# Patient Record
Sex: Male | Born: 1935 | Race: White | Hispanic: Yes | Marital: Married | Smoking: Former smoker
Health system: Southern US, Community
[De-identification: ages and names within clinical notes are randomized; demographics above are authoritative.]

## PROBLEM LIST (undated history)

## (undated) DIAGNOSIS — G8929 Other chronic pain: Secondary | ICD-10-CM

## (undated) DIAGNOSIS — M25559 Pain in unspecified hip: Secondary | ICD-10-CM

## (undated) DIAGNOSIS — I10 Essential (primary) hypertension: Secondary | ICD-10-CM

## (undated) DIAGNOSIS — E669 Obesity, unspecified: Secondary | ICD-10-CM

## (undated) DIAGNOSIS — K922 Gastrointestinal hemorrhage, unspecified: Secondary | ICD-10-CM

## (undated) DIAGNOSIS — E785 Hyperlipidemia, unspecified: Secondary | ICD-10-CM

## (undated) DIAGNOSIS — I639 Cerebral infarction, unspecified: Secondary | ICD-10-CM

## (undated) HISTORY — PX: TONSILLECTOMY: SUR1361

## (undated) NOTE — *Deleted (*Deleted)
[facility]ENCY DEPT Provider Student Note For educational purposes for Medical, PA and NP students only and not part of the legal medical record.   CSN: [id number] Arrival date & time: 12/15/19  1911      History   Chief Complaint Chief Complaint  Patient presents with  . Constipation    HPI Seymore Brodowski is a 56 y.o. male.  The history is provided by the patient.     Pt is an 46 year old male with a history of HTN who presents today with a 2 month history of constipation. He states that he has only been having about 1 BM per week and is "unsure of any pain". He states that he "inducing vomiting" because he can feel when his food gets stuck after eating and feels his stomach "swell". He does endorse both hematemesis and melena. He has tried magnesium for his constipation and says that he noticing it get to the end and will have to manually disimpact. He has a 100 pack per year history of smoking. He also states that he had a colonoscopy about 10 years ago and had some polyps. He denies and previous abdominal surgeries. He also states that from August 2021 to September 2021 he lost about 20 lbs.   Past Medical History:  Diagnosis Date  . Gastric ulcer    secondary to nsaid 10 years ago  . GI bleed 04/04/2011  . Hip pain, chronic    right  . HLD (hyperlipidemia)   . Hypertension   . Obesity (BMI 35.0-39.9 without comorbidity) 03/2011  . Stroke [facility])    2005    Patient Active Problem List   Diagnosis Date Noted  . Diverticulosis of colon (without mention of hemorrhage) 04/06/2011  . Benign neoplasm of colon 04/06/2011  . Diverticulosis of colon with hemorrhage 04/06/2011  . Diaphragmatic hernia without mention of obstruction or gangrene 04/06/2011  . Hyperglycemia 04/05/2011  . Syncope and collapse 04/05/2011  . Ankle fracture, lateral malleolus, closed 04/05/2011  . Acute posthemorrhagic anemia 04/05/2011  . Blood in stool 04/05/2011  . Acute GI bleeding 04/05/2011     Past Surgical History:  Procedure Laterality Date  . TONSILLECTOMY         Home Medications    Prior to Admission medications   Medication Sig Start Date End Date Taking? Authorizing Provider  amLODipine (NORVASC) 10 MG tablet Take 1 tablet (10 mg total) by mouth daily. 10/21/19   Shade Flood, MD  lisinopril-hydrochlorothiazide (ZESTORETIC) 10-12.5 MG tablet Take 1 tablet by mouth daily. 10/21/19   Shade Flood, MD    Family History Family History  Problem Relation Age of Onset  . Stroke Mother   . Kidney disease Brother   . Diabetes Brother     Social History Social History   Tobacco Use  . Smoking status: Former Games developer  . Smokeless tobacco: Never Used  Substance Use Topics  . Alcohol use: Yes    Comment: occasional  . Drug use: No     Allergies   Pravastatin   Review of Systems Review of Systems  Constitutional: Positive for unexpected weight change (20lb weight loss from August 2021-Sept. 2021). Negative for chills, diaphoresis, fatigue and fever.  HENT: Negative.   Eyes: Negative.   Respiratory: Negative.   Cardiovascular: Negative.   Gastrointestinal: Positive for abdominal pain, blood in stool, constipation and vomiting (self induced). Negative for abdominal distention, diarrhea and nausea.  Endocrine: Negative.   Genitourinary: Negative.   Musculoskeletal: Negative.  Skin: Negative.   Allergic/Immunologic: Negative.   Neurological: Negative.   Hematological: Negative.   Psychiatric/Behavioral: Negative.      Physical Exam Updated Vital Signs BP 134/86   Pulse (!) 110   Temp 98.9 F (37.2 C) (Oral)   Resp 18   Ht 5\' 6"  (1.676 m)   Wt 90 kg   SpO2 100%   BMI 32.02 kg/m   Physical Exam Constitutional:      Appearance: Normal appearance.  HENT:     Head: Normocephalic and atraumatic.  Abdominal:     General: Bowel sounds are normal.     Palpations: There is mass (central, right of midline).     Tenderness: There is  abdominal tenderness.    Neurological:     Mental Status: He is alert.      ED Treatments / Results  Labs (all labs ordered are listed, but only abnormal results are displayed) Labs Reviewed  LIPASE, BLOOD - Abnormal; Notable for the following components:      Result Value   Lipase 55 (*)    All other components within normal limits  COMPREHENSIVE METABOLIC PANEL - Abnormal; Notable for the following components:   Potassium 3.4 (*)    Glucose, Bld 156 (*)    BUN 50 (*)    Creatinine, Ser 2.02 (*)    Calcium 8.6 (*)    Total Protein 5.1 (*)    Albumin 2.8 (*)    Alkaline Phosphatase 37 (*)    GFR, Estimated 32 (*)    All other components within normal limits  CBC - Abnormal; Notable for the following components:   WBC 19.4 (*)    RBC 1.83 (*)    Hemoglobin 5.2 (*)    HCT 17.1 (*)    RDW 17.7 (*)    nRBC 1.0 (*)    All other components within normal limits  RESPIRATORY PANEL BY RT PCR (FLU A&B, COVID)  PROTIME-INR  URINALYSIS, ROUTINE W REFLEX MICROSCOPIC  TYPE AND SCREEN  PREPARE RBC (CROSSMATCH)    EKG  Radiology CT ABDOMEN PELVIS WO CONTRAST  Result Date: 12/15/2019 CLINICAL DATA:  Gastrointestinal hemorrhage, diffuse abdominal pain, emesis EXAM: CT ABDOMEN AND PELVIS WITHOUT CONTRAST TECHNIQUE: Multidetector CT imaging of the abdomen and pelvis was performed following the standard protocol without IV contrast. COMPARISON:  None. FINDINGS: Lower chest: The visualized lung bases are clear save for a few insignificant pneumatoceles. Visualized heart and pericardium are unremarkable. Hepatobiliary: Multiple simple cysts noted within the a hepatic dome. Liver otherwise unremarkable the gallbladder is contracted without pericholecystic inflammatory change. Punctate calcification within the gallbladder fossa may relate to remote or chronic inflammation. The extrahepatic bile duct is within normal limits for age. No intrahepatic biliary ductal dilation. Pancreas:  Unremarkable Spleen: Fall unremarkable Adrenals/Urinary Tract: Bilateral adrenal adenoma are identified measuring 20 mm on the left and 22 mm on the right. The kidneys are normal in size and position. No intrarenal or ureteral calculi. Exophytic simple cortical cysts arise from the left kidney. There is moderate bilateral hydronephrosis and marked hydroureter. There is massive distention of the bladder with an estimated bladder volume of approximately 1800 cc. Stomach/Bowel: Severe sigmoid diverticulosis. Scattered diverticular seen throughout the remainder of the colon. The stomach, small bowel, and large bowel are otherwise unremarkable. Appendix normal. No free intraperitoneal gas or fluid. Vascular/Lymphatic: Moderate aortoiliac atherosclerotic calcification. No aneurysm. No pathologic adenopathy within the abdomen and pelvis Reproductive: Moderate prostatic enlargement. Seminal vesicles are unremarkable. Other: Small fat containing bilateral  inguinal hernias are present. Rectum unremarkable. Musculoskeletal: No acute bone abnormality. Bilateral L5 pars defects are present with grade 1 anterolisthesis of L5 upon S1. Degenerative changes are seen throughout the lumbar spine. IMPRESSION: Marked distension of the bladder and moderate bilateral hydronephrosis in keeping with changes of neurogenic bladder or bladder outlet obstruction, possibly related to prostatic hypertrophy. The estimated bladder volume is approximately 1800 cc. Contracted gallbladder demonstrating punctate foci of calcification within the gallbladder fossa possibly reflecting changes of chronic cholecystitis. This could be further assessed with dedicated sonography or hepatic biliary scintigraphy once the patient's acute issues have resolved. Bilateral adrenal adenoma. Aortic Atherosclerosis (ICD10-I70.0). Electronically Signed   By: Helyn Numbers MD   On: 12/15/2019 22:27   DG Chest 2 View  Result Date: 12/15/2019 CLINICAL DATA:  GI  bleeding, concern for malignancy, evaluate pulmonary nodules EXAM: CHEST - 2 VIEW COMPARISON:  Insert agree CT abdomen pelvis 12/15/2019, radiograph 04/04/2011 FINDINGS: Low lung volumes. Vascular crowding with ectatic changes. No discernible pulmonary nodules or masses. Mild vascular congestion without frank edema. No consolidative opacity. Prominent cardiac silhouette though possibly accentuated by technique. The aorta is calcified. The remaining cardiomediastinal contours are unremarkable. No acute osseous or soft tissue abnormality. Degenerative changes are present in the imaged spine and shoulders. Telemetry leads overlie the chest. IMPRESSION: Low lung volumes, atelectasis and vascular congestion without frank edema. No discerning pulmonary nodules or masses though evaluation limited on portable radiographic technique. These results and limitations were discussed by telephone at the time of interpretation on 12/15/2019 at 10:32 pm to provider [facility] , who verbally acknowledged these results. Electronically Signed   By: Kreg Shropshire M.D.   On: 12/15/2019 22:32    Procedures Procedures (including critical care time)  Medications Ordered in ED Medications  0.9 %  sodium chloride infusion (has no administration in time range)  lidocaine (XYLOCAINE) 2 % jelly 1 application (has no administration in time range)  sodium chloride 0.9 % bolus 500 mL (500 mLs Intravenous New Bag/Given 12/15/19 2159)     Initial Impression / Assessment and Plan / ED Course  I have reviewed the triage vital signs and the nursing notes.  Pertinent labs & imaging results that were available during my care of the patient were reviewed by me and considered in my medical decision making (see chart for details).     ***  Final Clinical Impressions(s) / ED Diagnoses   Final diagnoses:  None    New Prescriptions New Prescriptions   No medications on file

---

## 2000-07-15 ENCOUNTER — Inpatient Hospital Stay (HOSPITAL_COMMUNITY): Admission: EM | Admit: 2000-07-15 | Discharge: 2000-07-18 | Payer: Self-pay | Admitting: Emergency Medicine

## 2000-07-15 ENCOUNTER — Encounter: Payer: Self-pay | Admitting: Emergency Medicine

## 2000-07-15 ENCOUNTER — Encounter: Payer: Self-pay | Admitting: Pediatrics

## 2011-03-30 DIAGNOSIS — E669 Obesity, unspecified: Secondary | ICD-10-CM

## 2011-03-30 HISTORY — DX: Obesity, unspecified: E66.9

## 2011-04-04 ENCOUNTER — Encounter (HOSPITAL_COMMUNITY): Payer: Self-pay | Admitting: Internal Medicine

## 2011-04-04 ENCOUNTER — Inpatient Hospital Stay (HOSPITAL_COMMUNITY)
Admission: EM | Admit: 2011-04-04 | Discharge: 2011-04-06 | DRG: 378 | Disposition: A | Discharge status: Home / Self Care | Payer: Medicare Other | Attending: Internal Medicine | Admitting: Internal Medicine

## 2011-04-04 ENCOUNTER — Other Ambulatory Visit: Payer: Self-pay

## 2011-04-04 ENCOUNTER — Emergency Department (HOSPITAL_COMMUNITY): Payer: Medicare Other

## 2011-04-04 DIAGNOSIS — I10 Essential (primary) hypertension: Secondary | ICD-10-CM | POA: Diagnosis present

## 2011-04-04 DIAGNOSIS — G8929 Other chronic pain: Secondary | ICD-10-CM | POA: Diagnosis present

## 2011-04-04 DIAGNOSIS — R739 Hyperglycemia, unspecified: Secondary | ICD-10-CM | POA: Diagnosis present

## 2011-04-04 DIAGNOSIS — E785 Hyperlipidemia, unspecified: Secondary | ICD-10-CM | POA: Diagnosis present

## 2011-04-04 DIAGNOSIS — K449 Diaphragmatic hernia without obstruction or gangrene: Secondary | ICD-10-CM | POA: Diagnosis present

## 2011-04-04 DIAGNOSIS — K5730 Diverticulosis of large intestine without perforation or abscess without bleeding: Secondary | ICD-10-CM | POA: Diagnosis present

## 2011-04-04 DIAGNOSIS — E669 Obesity, unspecified: Secondary | ICD-10-CM | POA: Diagnosis present

## 2011-04-04 DIAGNOSIS — D649 Anemia, unspecified: Secondary | ICD-10-CM | POA: Diagnosis present

## 2011-04-04 DIAGNOSIS — K648 Other hemorrhoids: Secondary | ICD-10-CM | POA: Diagnosis present

## 2011-04-04 DIAGNOSIS — K921 Melena: Secondary | ICD-10-CM | POA: Diagnosis present

## 2011-04-04 DIAGNOSIS — R7309 Other abnormal glucose: Secondary | ICD-10-CM | POA: Diagnosis present

## 2011-04-04 DIAGNOSIS — D62 Acute posthemorrhagic anemia: Secondary | ICD-10-CM | POA: Diagnosis present

## 2011-04-04 DIAGNOSIS — M25559 Pain in unspecified hip: Secondary | ICD-10-CM | POA: Diagnosis present

## 2011-04-04 DIAGNOSIS — S8263XA Displaced fracture of lateral malleolus of unspecified fibula, initial encounter for closed fracture: Secondary | ICD-10-CM | POA: Diagnosis present

## 2011-04-04 DIAGNOSIS — D126 Benign neoplasm of colon, unspecified: Secondary | ICD-10-CM | POA: Diagnosis present

## 2011-04-04 DIAGNOSIS — W19XXXA Unspecified fall, initial encounter: Secondary | ICD-10-CM | POA: Diagnosis present

## 2011-04-04 DIAGNOSIS — K5731 Diverticulosis of large intestine without perforation or abscess with bleeding: Principal | ICD-10-CM | POA: Diagnosis present

## 2011-04-04 DIAGNOSIS — K922 Gastrointestinal hemorrhage, unspecified: Secondary | ICD-10-CM | POA: Diagnosis present

## 2011-04-04 DIAGNOSIS — Z8673 Personal history of transient ischemic attack (TIA), and cerebral infarction without residual deficits: Secondary | ICD-10-CM

## 2011-04-04 DIAGNOSIS — R55 Syncope and collapse: Secondary | ICD-10-CM | POA: Diagnosis present

## 2011-04-04 DIAGNOSIS — Z79899 Other long term (current) drug therapy: Secondary | ICD-10-CM

## 2011-04-04 HISTORY — DX: Gastrointestinal hemorrhage, unspecified: K92.2

## 2011-04-04 HISTORY — DX: Pain in unspecified hip: M25.559

## 2011-04-04 HISTORY — DX: Cerebral infarction, unspecified: I63.9

## 2011-04-04 HISTORY — DX: Hyperlipidemia, unspecified: E78.5

## 2011-04-04 HISTORY — DX: Essential (primary) hypertension: I10

## 2011-04-04 HISTORY — DX: Other chronic pain: G89.29

## 2011-04-04 HISTORY — DX: Obesity, unspecified: E66.9

## 2011-04-04 LAB — CARDIAC PANEL(CRET KIN+CKTOT+MB+TROPI)
Relative Index: 2 (ref 0.0–2.5)
Total CK: 173 U/L (ref 7–232)
Troponin I: <0.3 ng/mL (ref <0.30)

## 2011-04-04 LAB — COMPREHENSIVE METABOLIC PANEL
ALT: 11 U/L (ref 0–53)
Albumin: 2.8 g/dL — ABNORMAL LOW (ref 3.5–5.2)
Alkaline Phosphatase: 54 U/L (ref 39–117)
Calcium: 7.8 mg/dL — ABNORMAL LOW (ref 8.4–10.5)
GFR calc Af Amer: >90 mL/min (ref >90)
Potassium: 3.4 mEq/L — ABNORMAL LOW (ref 3.5–5.1)
Sodium: 144 mEq/L (ref 135–145)
Total Protein: 5.7 g/dL — ABNORMAL LOW (ref 6.0–8.3)

## 2011-04-04 LAB — APTT: aPTT: 30 seconds (ref 24–37)

## 2011-04-04 LAB — CBC
MCH: 27.3 pg (ref 26.0–34.0)
MCHC: 32.6 g/dL (ref 30.0–36.0)
MCV: 83.9 fL (ref 78.0–100.0)
Platelets: 235 10*3/uL (ref 150–400)
RBC: 3.11 MIL/uL — ABNORMAL LOW (ref 4.22–5.81)
RDW: 14.3 % (ref 11.5–15.5)

## 2011-04-04 LAB — DIFFERENTIAL
Basophils Absolute: 0 10*3/uL (ref 0.0–0.1)
Basophils Relative: 0 % (ref 0–1)
Eosinophils Absolute: 0 10*3/uL (ref 0.0–0.7)
Eosinophils Relative: 0 % (ref 0–5)
Neutrophils Relative %: 83 % — ABNORMAL HIGH (ref 43–77)

## 2011-04-04 LAB — ABO/RH: ABO/RH(D): O POS

## 2011-04-04 LAB — PROTIME-INR: INR: 1.12 (ref 0.00–1.49)

## 2011-04-04 MED ORDER — PANTOPRAZOLE SODIUM 40 MG PO TBEC
40.0000 mg | DELAYED_RELEASE_TABLET | Freq: Every day | ORAL | Status: DC
  Administered 2011-04-05: 40 mg via ORAL
  Filled 2011-04-04: qty 1

## 2011-04-04 MED ORDER — SIMVASTATIN 10 MG PO TABS
10.0000 mg | ORAL_TABLET | Freq: Every day | ORAL | Status: DC
  Administered 2011-04-04 – 2011-04-05 (×2): 10 mg via ORAL
  Filled 2011-04-04 (×3): qty 1

## 2011-04-04 MED ORDER — SODIUM CHLORIDE 0.9 % IV BOLUS (SEPSIS)
500.0000 mL | Freq: Once | INTRAVENOUS | Status: AC
  Administered 2011-04-04: 500 mL via INTRAVENOUS

## 2011-04-04 MED ORDER — ACETAMINOPHEN 325 MG PO TABS: 650.0000 mg | ORAL_TABLET | ORAL | Status: DC | PRN

## 2011-04-04 MED ORDER — SODIUM CHLORIDE 0.9 % IV SOLN
INTRAVENOUS | Status: DC
  Administered 2011-04-05 – 2011-04-06 (×3): via INTRAVENOUS

## 2011-04-04 NOTE — H&P (Signed)
Hospital Admission Note Date: 04/04/2011  Patient name:  Steven Vargas   Medical record number:  [id number] Date of birth:  [birth year]  Age: 76 y.o. Gender:  male PCP:    Raliegh Ip, MD, MD  Medical Service:   Internal Medicine Teaching Service   Attending physician:  Dr. Aundria Rud First Contact:   Dr. Dorise Hiss  Pager: [phone]                                                 Second Contact:   Dr. Dorthula Rue  Pager: [phone] After Hours:    First Contact   Pager: [phone]      Second Contact  Pager: [phone]  Chief Complaint: Rectal bleed and fall  History of Present Illness: Patient is a 76 y.o. male with a PMHx of hyperlipidemia was completely fine until Saturday ( 4 days ago ) evening. He went to have a bowel movement after his dinner on Saturday when he noticed the toilet bowl was filled with blood. After this episode he continued to have bloody bowel movements every 15 minutes that evening. The amount of blood stay the same and all the bowel movements. He had about 6 or 7 such bowel movements before that stopped and he went to sleep. He was fine after that until yesterday evening when he had the same episode of about 8-10 bloody bowel movements throughout the night . After his last bowel movement he became dizzy in the toilet and had a fall and he injured his left ankle. He did not have any pain in his belly or rectum. He denies any nausea, vomiting, constipation or diarrhea. He denies similar symptoms in the past. He takes Mobic for his right hip pain but does last dose was 2 weeks ago. No family history of colon cancers. No history of hemorrhoids. No chest pain, shortness of breath, weakness in his arms or legs or any other complaints.   Current Outpatient Medications: Current Facility-Administered Medications  Medication Dose Route Frequency Provider Last Rate Last Dose  . sodium chloride 0.9 % bolus 500 mL  500 mL Intravenous Once Hilario Quarry, MD   500 mL at 04/04/11 5284   Current  Outpatient Prescriptions  Medication Sig Dispense Refill  . meloxicam (MOBIC) 7.5 MG tablet Take 7.5 mg by mouth daily as needed. For pain      . simvastatin (ZOCOR) 10 MG tablet Take 10 mg by mouth at bedtime.        Allergies: No Known Allergies   Past Medical History: Past Medical History  Diagnosis Date  . HLD (hyperlipidemia)   . Gastric ulcer     secondary to nsaid 10 years ago  . Hip pain, chronic     right    Past Surgical History: No past surgical history on file.  Family History: Family History  Problem Relation Age of Onset  . Stroke Mother     Social History: History   Social History  . Marital Status: Married    Spouse Name: [name]    Number of Children: [name]  . Years of Education: [name]   Occupational History  . Not on file.   Social History Main Topics  . Smoking status: Not on file  . Smokeless tobacco: Not on file  . Alcohol Use: Not on file  . Drug Use: Not on file  .  Sexually Active: Not on file   Other Topics Concern  . Not on file   Social History Narrative   [place] and son. 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 Drinks 2-3 beers once every few weeks. Smoking 2 years ago. Used to smoke 2-3 packs per day before that for past many years. Never did illicit drugs. Has [employer] health care Medicare.    Review of Systems: Constitutional:  Denies fever, chills, diaphoresis, appetite change and fatigue.  HEENT: Denies photophobia, eye pain, redness, hearing loss, ear pain, congestion, sore throat, rhinorrhea, sneezing, mouth sores, trouble swallowing, neck pain, neck stiffness and tinnitus.  Respiratory: Denies SOB, DOE, cough, chest tightness, and wheezing.  Cardiovascular: Denies chest pain, palpitations and leg swelling.  Gastrointestinal:  as per history of present illness  Genitourinary: Denies dysuria, urgency, frequency, hematuria, flank pain and difficulty urinating.  Musculoskeletal:  Denies myalgias, back pain, joint swelling, arthralgias and gait problem.   Skin: Denies pallor, rash and wound.  Neurological: Denies dizziness, seizures, syncope, weakness, light-headedness, numbness and headaches.   Hematological: Denies adenopathy. Easy bruising, personal or family bleeding history.  Psychiatric/ Behavioral: Denies suicidal ideation, mood changes, confusion, nervousness, sleep disturbance and agitation.   Filed Vitals:   04/04/11 0925  BP: 121/103  Pulse: 119  Temp: 98.6 F (37 C)  Resp: 20   Unable to perform vitals in standing position because of fractured his left ankle   Physical Exam: General: Vital signs reviewed and noted. Well-developed, well-nourished, in no acute distress; alert, appropriate and cooperative throughout examination.  Head: Normocephalic, atraumatic.  Eyes: PERRL, EOMI, No signs of anemia or jaundince.  Nose: Mucous membranes moist, not inflammed, nonerythematous.  Throat: Oropharynx nonerythematous, no exudate appreciated.   Neck: No deformities, masses, or tenderness noted.Supple, No carotid Bruits, no JVD.  Lungs:  Normal respiratory effort. Clear to auscultation BL without crackles or wheezes.  Heart: RRR. S1 and S2 normal without gallop, murmur, or rubs.  Abdomen:  BS normoactive. Soft, Nondistended, non-tender.  No masses or organomegaly.  Rectum   maroon-colored stool in the rectal vault, no hemorrhoids   Extremities: No pretibial edema.  Neurologic: A&O X3, CN II - XII are grossly intact. Motor strength is 5/5 in the all 4 extremities, Sensations intact to light touch, Cerebellar signs negative.  Skin: No visible rashes, scars.   Lab results: Basic Metabolic Panel: Recent Labs  [facility] 04/04/11 0940   NA 144   K 3.4*   CL 111   CO2 26   GLUCOSE 138*   BUN 16   CREATININE 0.96   CALCIUM 7.8*   MG --   PHOS --   Liver Function Tests: Recent Labs  [facility] 04/04/11 0940   AST 12   ALT 11   ALKPHOS 54   BILITOT  0.2*   PROT 5.7*   ALBUMIN 2.8*   No results found for this basename: LIPASE:2,AMYLASE:2 in the last 72 hours CBC: Recent Labs  Basename 04/04/11 0940   WBC 13.2*   NEUTROABS 10.9*   HGB 8.5*   HCT 26.1*   MCV 83.9   PLT 235   Cardiac Enzymes: Recent Labs  Basename 04/04/11 0943   CKTOTAL --   CKMB --   CKMBINDEX --   TROPONINI <0.30   Imaging results:  Dg Chest 1 View  04/04/2011  *RADIOLOGY REPORT*  Clinical Data: History of a recent fall.  Dizziness.  CHEST - 1 VIEW  Comparison:  Chest x-ray 07/16/2010.  Findings: Lung volumes are normal.  No consolidative airspace disease.  No pleural effusions.  No pneumothorax.  No pulmonary nodule or mass noted.  Pulmonary vasculature and the cardiomediastinal silhouette are within normal limits.  IMPRESSION: 1. No radiographic evidence of acute cardiopulmonary disease.  Original Report Authenticated By: Florencia Reasons, M.D.   Dg Ankle Complete Left  04/04/2011  *RADIOLOGY REPORT*  Clinical Data: History of fall with injury to left ankle complaining of left-sided ankle pain.  LEFT ANKLE COMPLETE - 3+ VIEW  Comparison: No priors.  Findings: Multiple views of the left ankle demonstrate an acute nondisplaced fracture of the tip of the lateral malleolus, with overlying soft tissue swelling.  Alignment at the ankle mortise is preserved.  No additional acute fractures are identified.  IMPRESSION: 1.  Acute nondisplaced fracture of the tip of the lateral malleolus.  Original Report Authenticated By: Florencia Reasons, M.D.    Other results: EKG: Flattening of T waves in inferior leads which is new compared to his previous EKG in 2002,  sinus tachycardia   Assessment & Plan:  76 year old man with past medical history of hyperlipidemia comes in today complaining of bright red blood per rectum yesterday and also 3 days ago after bowel movement  #1 GI bleed: - Painless bright red bleeding per rectum in this 76 year old gentleman may most likely  be from diverticulosis or AV malformation - He is tachycardic with some nonspecific EKG changes, but he is hemodynamically stable at this time.  Plan -Admit to telemetry -Resuscitation with normal saline and 2 units of PRBC - Cycle cardiac enzymes and repeat EKG in the morning -Consult GI for intervention - Keep on clear liquid diet until midnight and keep n.p.o. after midnight - Check CBC q. 8 hours after transfusion  #2 left nondisplaced ankle fracture Secondary to fall because of dizziness from JB -Ankle splint - PT after current acute problems are  #3 chronic hip pain -Avoid nonsteroidal anti-inflammatory drugs -Tylenol and narcotics as needed for pain    #4 hyperlipidemia - Continue statin   #5 DVT PPX - SCDs     Bethel Born, M.D. (PGY3):  ____________________________________    Date/ Time:     ____________________________________        I have seen and examined the patient. I reviewed the resident/fellow note and agree with the findings and plan of care as documented. My additions and revisions are included.   Signature:  ____________________________________________     Internal Medicine Teaching Service Attending    Date:    ____________________________________________

## 2011-04-04 NOTE — Progress Notes (Signed)
Orthopedic Tech Progress Note Patient Details:  Steven Vargas 03-12-1935 [id number]  Type of Splint: Short Leg Splint Interventions: Application    Cammer, Mickie Bail 04/04/2011, 11:51 AM

## 2011-04-04 NOTE — Progress Notes (Signed)
Called [name] with GI; said they would stop by and see pt tomorrow; told pt could have clear liquids; will continue to monitor

## 2011-04-04 NOTE — ED Notes (Signed)
3710-01 Ready 

## 2011-04-04 NOTE — ED Provider Notes (Signed)
History     CSN: [id number]  Arrival date & time 04/04/11  0911   First MD Initiated Contact with Patient 04/04/11 [phone]      Chief Complaint  Patient presents with  . Rectal Bleeding    (Consider location/radiation/quality/duration/timing/severity/associated sxs/prior treatment) HPI  Patient comes in today complaining that he had rectal bleeding that began on Saturday night. He states he had multiple episodes up to 7 or 8. This resolved on Sunday to begin again yesterday and continued through the night. He states the last time that he became lightheaded and passed out in the bathroom. He awoke with stool on him and has pain in his left ankle. He denies any other injury. He does not think he struck his head. He is not on blood thinners. He states he has a history of rectal bleeding approximately 15 years ago but cannot recall details of the episode. He denies any abdominal pain, nausea, vomiting, or fever. Denies chest pain.  No past medical history on file.  No past surgical history on file.  No family history on file.  History  Substance Use Topics  . Smoking status: Not on file  . Smokeless tobacco: Not on file  . Alcohol Use: Not on file      Review of Systems  Musculoskeletal:       Left ankle pain after fall  Neurological: Positive for light-headedness.  All other systems reviewed and are negative.    Allergies  Review of patient's allergies indicates not on file.  Home Medications  No current outpatient prescriptions on file.  BP 121/103  Pulse 119  Temp(Src) 98.6 F (37 C) (Oral)  Resp 20  SpO2 98%  Physical Exam  Nursing note and vitals reviewed. Constitutional: He is oriented to person, place, and time. He appears well-developed and well-nourished.  HENT:  Head: Normocephalic and atraumatic.  Right Ear: External ear normal.  Left Ear: External ear normal.  Nose: Nose normal.  Mouth/Throat: Oropharynx is clear and moist.  Eyes: EOM are normal.  Pupils are equal, round, and reactive to light.       Conjunctiva pale  Neck: Normal range of motion. Neck supple.  Cardiovascular: Normal rate, regular rhythm, normal heart sounds and intact distal pulses.   Pulmonary/Chest: Effort normal and breath sounds normal.  Abdominal: Soft. Bowel sounds are normal.  Genitourinary:       Maroon stool, no masses  Musculoskeletal: Normal range of motion.  Neurological: He is alert and oriented to person, place, and time.  Skin: Skin is warm. There is pallor.  Psychiatric: He has a normal mood and affect.    ED Course  Procedures (including critical care time)   Labs Reviewed  CBC  DIFFERENTIAL  COMPREHENSIVE METABOLIC PANEL  TROPONIN I  PROTIME-INR  APTT  TYPE AND SCREEN  OCCULT BLOOD, POC DEVICE   No results found.   No diagnosis found.    MDM   Date: 04/04/2011   Rate: 111  Rhythm: sinus tachycardia  QRS Axis: normal  Intervals: normal  ST/T Wave abnormalities: nonspecific ST changes  Conduction Disutrbances:none  Narrative Interpretation:   Old EKG Reviewed: changes noted, tachycardia is new   Results for orders placed during the hospital encounter of 04/04/11  CBC      Component Value Range   WBC 13.2 (*) 4.0 - 10.5 (K/uL)   RBC 3.11 (*) 4.22 - 5.81 (MIL/uL)   Hemoglobin 8.5 (*) 13.0 - 17.0 (g/dL)   HCT 11.9 (*) 14.7 -  52.0 (%)   MCV 83.9  78.0 - 100.0 (fL)   MCH 27.3  26.0 - 34.0 (pg)   MCHC 32.6  30.0 - 36.0 (g/dL)   RDW 82.9  56.2 - 13.0 (%)   Platelets 235  150 - 400 (K/uL)  DIFFERENTIAL      Component Value Range   Neutrophils Relative 83 (*) 43 - 77 (%)   Neutro Abs 10.9 (*) 1.7 - 7.7 (K/uL)   Lymphocytes Relative 10 (*) 12 - 46 (%)   Lymphs Abs 1.3  0.7 - 4.0 (K/uL)   Monocytes Relative 7  3 - 12 (%)   Monocytes Absolute 0.9  0.1 - 1.0 (K/uL)   Eosinophils Relative 0  0 - 5 (%)   Eosinophils Absolute 0.0  0.0 - 0.7 (K/uL)   Basophils Relative 0  0 - 1 (%)   Basophils Absolute 0.0  0.0 - 0.1 (K/uL)   COMPREHENSIVE METABOLIC PANEL      Component Value Range   Sodium 144  135 - 145 (mEq/L)   Potassium 3.4 (*) 3.5 - 5.1 (mEq/L)   Chloride 111  96 - 112 (mEq/L)   CO2 26  19 - 32 (mEq/L)   Glucose, Bld 138 (*) 70 - 99 (mg/dL)   BUN 16  6 - 23 (mg/dL)   Creatinine, Ser 8.65  0.50 - 1.35 (mg/dL)   Calcium 7.8 (*) 8.4 - 10.5 (mg/dL)   Total Protein 5.7 (*) 6.0 - 8.3 (g/dL)   Albumin 2.8 (*) 3.5 - 5.2 (g/dL)   AST 12  0 - 37 (U/L)   ALT 11  0 - 53 (U/L)   Alkaline Phosphatase 54  39 - 117 (U/L)   Total Bilirubin 0.2 (*) 0.3 - 1.2 (mg/dL)   GFR calc non Af Amer 80 (*) >90 (mL/min)   GFR calc Af Amer >90  >90 (mL/min)  TROPONIN I      Component Value Range   Troponin I <0.30  <0.30 (ng/mL)  PROTIME-INR      Component Value Range   Prothrombin Time 14.6  11.6 - 15.2 (seconds)   INR 1.12  0.00 - 1.49   APTT      Component Value Range   aPTT 30  24 - 37 (seconds)  OCCULT BLOOD, POC DEVICE      Component Value Range   Fecal Occult Bld POSITIVE        Patient with gross rectal bleeding here with tachycardia and decreased hemoglobin at 8.5.  Patient is ordered to be transfused 2 units packed red blood cells. Patient with tenderness of her left lateral malleolus. X-Nobuo Nunziata shows lateral malleolar fracture. Posterior splint is being placed and orthopedics will need to be consulted.  Date: 04/04/2011  Rate: 111  Rhythm: sinus tachycardia  QRS Axis: normal  Intervals: normal  ST/T Wave abnormalities: nonspecific ST changes  Conduction Disutrbances:none  Narrative Interpretation:   Old EKG Reviewed: rate increased   CRITICAL CARE Performed by: Athens Lebeau S   Total critical care time: 30  Patient with active GI bleeding with tachycardia. Patient required monitoring and transfusion of packed red blood cells.  Critical care time was exclusive of separately billable procedures and treating other patients.  Critical care was necessary to treat or prevent imminent or life-threatening  deterioration.  Critical care was time spent personally by me on the following activities: development of treatment plan with patient and/or surrogate as well as nursing, discussions with consultants, evaluation of patient's response to treatment, examination of  patient, obtaining history from patient or surrogate, ordering and performing treatments and interventions, ordering and review of laboratory studies, ordering and review of radiographic studies, pulse oximetry and re-evaluation of patient's condition.   Hilario Quarry, MD 04/04/11 1218

## 2011-04-04 NOTE — ED Notes (Signed)
MD at bedside. 

## 2011-04-04 NOTE — ED Notes (Signed)
Admitting MD at bedside.

## 2011-04-04 NOTE — ED Notes (Signed)
Pt presents with rectal bleed X Saturday. Pt reports the bleeding subsided and returned yesterday. Blood is bright red. Pt denies any blood thinner. Pt reports lightheadness and dizziness. Pt fell this morning and reports left ankle pain r/t fall. Pt is from home.

## 2011-04-04 NOTE — ED Notes (Signed)
Pre blood vitals. 

## 2011-04-04 NOTE — ED Notes (Signed)
Consent for blood products obtained at this time.

## 2011-04-05 ENCOUNTER — Other Ambulatory Visit: Payer: Self-pay

## 2011-04-05 ENCOUNTER — Encounter (HOSPITAL_COMMUNITY): Payer: Self-pay | Admitting: Physician Assistant

## 2011-04-05 DIAGNOSIS — R739 Hyperglycemia, unspecified: Secondary | ICD-10-CM | POA: Diagnosis present

## 2011-04-05 DIAGNOSIS — D62 Acute posthemorrhagic anemia: Secondary | ICD-10-CM | POA: Diagnosis present

## 2011-04-05 DIAGNOSIS — K922 Gastrointestinal hemorrhage, unspecified: Secondary | ICD-10-CM | POA: Diagnosis present

## 2011-04-05 DIAGNOSIS — K921 Melena: Secondary | ICD-10-CM | POA: Diagnosis present

## 2011-04-05 DIAGNOSIS — D649 Anemia, unspecified: Secondary | ICD-10-CM | POA: Diagnosis present

## 2011-04-05 DIAGNOSIS — S8263XA Displaced fracture of lateral malleolus of unspecified fibula, initial encounter for closed fracture: Secondary | ICD-10-CM | POA: Diagnosis present

## 2011-04-05 DIAGNOSIS — R55 Syncope and collapse: Secondary | ICD-10-CM | POA: Diagnosis present

## 2011-04-05 LAB — CBC
HCT: 30.5 % — ABNORMAL LOW (ref 39.0–52.0)
HCT: 31 % — ABNORMAL LOW (ref 39.0–52.0)
HCT: 30.5 % — ABNORMAL LOW (ref 39.0–52.0)
Hemoglobin: 10.2 g/dL — ABNORMAL LOW (ref 13.0–17.0)
MCH: 28.2 pg (ref 26.0–34.0)
MCV: 86.2 fL (ref 78.0–100.0)
Platelets: 209 10*3/uL (ref 150–400)
RBC: 3.6 MIL/uL — ABNORMAL LOW (ref 4.22–5.81)
RBC: 3.64 MIL/uL — ABNORMAL LOW (ref 4.22–5.81)
RBC: 3.54 MIL/uL — ABNORMAL LOW (ref 4.22–5.81)
RDW: 14.5 % (ref 11.5–15.5)
WBC: 10.5 10*3/uL (ref 4.0–10.5)
WBC: 9.4 10*3/uL (ref 4.0–10.5)

## 2011-04-05 LAB — TYPE AND SCREEN
ABO/RH(D): O POS
Antibody Screen: NEGATIVE
Unit division: 0
Unit division: 0

## 2011-04-05 LAB — CARDIAC PANEL(CRET KIN+CKTOT+MB+TROPI)
CK, MB: 2.4 ng/mL (ref 0.3–4.0)
CK, MB: 2.3 ng/mL (ref 0.3–4.0)
Relative Index: 1.9 (ref 0.0–2.5)
Total CK: 138 U/L (ref 7–232)
Total CK: 118 U/L (ref 7–232)

## 2011-04-05 MED ORDER — PEG-KCL-NACL-NASULF-NA ASC-C 100 G PO SOLR
1.0000 | Freq: Once | ORAL | Status: AC
  Administered 2011-04-05: 100 g via ORAL
  Filled 2011-04-05: qty 1

## 2011-04-05 NOTE — Consult Note (Signed)
Gastro Consult: 11:59 AM 04/05/2011   Referring Provider: Dr Modena Jansky, teaching resident. Primary Care Physician:  Raliegh Ip, MD Primary Gastroenterologist: no GI doctors names found in charts. He is unassigned  Reason for Consultation:  hematochezia  HPI: Steven Vargas is a 76 y.o. male.  Patient reportedly has history of a gastric ulcer from NSAID use 10 years ago. However I felt see any documentation in terms of progress Notes, discharge summaries or radiology reports supporting this. Patient is not clear whether he had an upper endoscopy.  He does not use proton pump inhibitors or acid suppressing medications as he does not have acid peptic symptoms.  He may have had a colonoscopy many years ago as part of a battery of tests required for his job. Again there is no documentation to actually support this Patient had a stroke in June of 2002.  On Saturday, 6 days ago, he developed painless hematochezia. Once it started he passed blood every 15 minutes with a total of 8 or so episodes that day. He did fine on Sunday. Again on Monday at 11 PM, he had recurrent painless hematochezia with several episodes over 2 hours. When in the bathroom he had a syncopal spell, fell and caused trauma to his left ankle. Patient was brought to the emergency room. His hemoglobin was 8.5.  We have no recent other CBCs for comparison. The patient says that when he had medication induced GI bleeding greater than 10 years ago that he was passing similar-looking stools. At that time though his stomach was "torn up" and he had a lot more active upper GI upset symptoms but he did not have hematemesis. Patient has since been transfused with 2 units of packed red blood cells. This morning hemoglobin is 10.2.  MCV is normal His BUN, LFTs and PT/INR are normal. He has an elevated blood glucose.  Ankle films reveal a nondisplaced fracture of the left lateral malleolus. This has been  immobilized with brace and wrapped  Past Medical History  Diagnosis Date  . HLD (hyperlipidemia)   . Gastric ulcer     secondary to nsaid 10 years ago  . Hip pain, chronic     right  . GI bleed 04/04/2011  . Obesity (BMI 35.0-39.9 without comorbidity) 03/2011    Past Surgical History  Procedure Date  . Tonsillectomy     Prior to Admission medications   Medication Sig Start Date End Date Taking? Authorizing Provider  meloxicam (MOBIC) 7.5 MG tablet Take 7.5 mg by mouth daily as needed. For pain   Yes Historical Provider, MD  simvastatin (ZOCOR) 10 MG tablet Take 10 mg by mouth at bedtime.   Yes Historical Provider, MD    Scheduled Meds:    . pantoprazole  40 mg Oral Q1200  . simvastatin  10 mg Oral QHS   Infusions:    . sodium chloride 150 mL/hr at 04/05/11 0556   PRN Meds: acetaminophen   Allergies as of 04/04/2011  . (No Known Allergies)    Family History  Problem Relation Age of Onset  . Stroke Mother     History   Social History  . Marital Status: Married    Spouse Name: [name]    Number of Children: [name]  . Years of Education: [name]   Occupational History  . Not on file.   Social History Main Topics  . Smoking status: Former Games developer  . Smokeless tobacco: Never Used  . Alcohol Use: Yes     occasional  .  Drug Use: No  . Sexually Active: Not Currently   Other Topics Concern  . Not on file   Social History Narrative   [place] and [place]. 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 Drinks 2-3 beers once every few weeks. Smoking 2 years ago. Used to smoke 2-3 packs per day before that for past many years. Never did illicit drugs. Has [employer] care Medicare.    REVIEW OF SYSTEMS: Constitutional:  Generally no weakness, no weight fluctuation. ENT:  Has seen some streaks of blood and nasal discharge. 2 weeks or so ago he had an episode of epistaxis. Patient has a problem with his right eardrum he says it  got torn many years ago. As a result he has a habit of bloating air out of his mouth and nose in attempt to equalize pressure in the right ear Pulm:  No shortness of breath, cough. No history of sleep apnea CV:  No chest pain, palpitations. Did have syncope as noted above GU:  No nocturia, no hematuria, no history of prostate disease GI:  No dysphasia, no heartburn, no nausea/vomiting. No blood per rectum prior to recent days' events. Heme:  No history of prior anemia..    Transfusions:  No previous transfusions Neuro:  Occasional headaches, occasional joint pains. Derm:  No rash, sores or itching Endocrine:  No history of thyroid disease. No prior history of diabetes Immunization:  Does not take flu shots. Travel:  none   PHYSICAL EXAM: Vital signs in last 24 hours: Temp:  [97.8 F (36.6 C)-99.8 F (37.7 C)] 97.8 F (36.6 C) (03/07 0542) Pulse Rate:  [80-110] 96  (03/07 0549) Resp:  [12-20] 20  (03/07 0542) BP: (125-188)/(81-140) 188/128 mmHg (03/07 0549) SpO2:  [95 %-97 %] 95 % (03/07 0542) Weight:  [230 lb 13.2 oz (104.7 kg)] 230 lb 13.2 oz (104.7 kg) (03/06 1812) BMI   35.2  General:  Chronically unwell, morbidly obese man in no distress Head:  No signs of trauma. Facies symmetric  Eyes:  No conjunctival pallor, no icterus. EOMI Ears:  Somewhat hard of hearing. No hearing aid in place.  I did not perform examination of the ear canal or drum.  Nose:  Some sinus congestion. Patient is repeatedly blowing air out from his nose in an almost tic-like fashion. He says he's doing this to relieve pressure in his right ear drop. Mouth:  Only 3 teeth left in the front lower jaw.  Oral mucosa is clear, pink, and moist. Neck:  No JVD, thyromegaly, masses, bruits Lungs:  Clear to auscultation and percussion bilaterally. Patient not dyspneic and is not coughing Heart: given the rate and rhythm. S1-S2 audible. No murmurs, rubs, gallops. Abdomen:  Obese, large, nondistended and soft. Active  bowel sounds. No masses, bruits, hernias or organomegaly.   Rectal: large but smooth prostate. Scant amount of stool is light grayish brown in color.  There is red blood on the underwear. I do not see red blood on the exam glove. Specimen from the digital exam including stool is 3-4+ heme-positive for blood.  Musc/Skeltl: left ankle immobilized with splint and Ace wrap. Extremities:  Nonpitting pedal and ankle edema.  Neurologic:  Patient is alert and oriented x3. No tremor. No weakness of the limbs. Skin:  No rash, sores or telangiectasia Tattoos:  none Nodes:  None at the neck   Psych:  Pleasant, relaxed, not depressed.  Intake/Output from previous day: 03/06 0701 -  03/07 0700 In: 1000 [Blood:1000] Out: 300 [Urine:300] Intake/Output this shift:    LAB RESULTS:  Basename 04/05/11 0840 04/05/11 0106 04/04/11 0940  WBC 10.2 10.5 13.2*  HGB 10.2* 10.1* 8.5*  HCT 31.0* 30.5* 26.1*  PLT 209 209 235   BMET Lab Results  Component Value Date   NA 144 04/04/2011   K 3.4* 04/04/2011   CL 111 04/04/2011   CO2 26 04/04/2011   GLUCOSE 138* 04/04/2011   BUN 16 04/04/2011   CREATININE 0.96 04/04/2011   CALCIUM 7.8* 04/04/2011   LFT  Basename 04/04/11 0940  PROT 5.7*  ALBUMIN 2.8*  AST 12  ALT 11  ALKPHOS 54  BILITOT 0.2*  BILIDIR --  IBILI --   PT/INR Lab Results  Component Value Date   INR 1.12 04/04/2011      RADIOLOGY STUDIES: Dg Chest 1 View  04/04/2011  *RADIOLOGY REPORT*  Clinical Data: History of a recent fall.  Dizziness.  CHEST - 1 VIEW  Comparison: Chest x-ray 07/16/2010.  Findings: Lung volumes are normal.  No consolidative airspace disease.  No pleural effusions.  No pneumothorax.  No pulmonary nodule or mass noted.  Pulmonary vasculature and the cardiomediastinal silhouette are within normal limits.  IMPRESSION: 1. No radiographic evidence of acute cardiopulmonary disease.  Original Report Authenticated By: Florencia Reasons, M.D.   Dg Ankle Complete Left  04/04/2011   *RADIOLOGY REPORT*  Clinical Data: History of fall with injury to left ankle complaining of left-sided ankle pain.  LEFT ANKLE COMPLETE - 3+ VIEW  Comparison: No priors.  Findings: Multiple views of the left ankle demonstrate an acute nondisplaced fracture of the tip of the lateral malleolus, with overlying soft tissue swelling.  Alignment at the ankle mortise is preserved.  No additional acute fractures are identified.  IMPRESSION: 1.  Acute nondisplaced fracture of the tip of the lateral malleolus.  Original Report Authenticated By: Florencia Reasons, M.D.    ENDOSCOPIC STUDIES: Possible upper endoscopy at the time of diagnosis of gastric ulcer and GI bleed. This was in 2003 or further back then that. Possible colonoscopy in 2003 or further back  IMPRESSION: 1.  painless hematochezia.  Although this reminds patient of when he had an upper GI bleed 10 years or more ago, I suspect this is a lower GI source, possibly diverticular versus neoplasia. His BUN is normal which counts against an upper GI source. 2. Normocytic anemia. Has received 1 unit packed red blood cells.   3. Syncopal spell during which he fell and fractured his left ankle. 4.  Hyperglycemia 5.  Hypertension,  note that his systolic blood pressures are running in the 160s to 180s, diastolic pressures running in the 80's, 90s and most recently measured 128.  PLAN: 1.  Colonoscopy and possible EGD,  Tomorrow ? Prep will be moderately challenging given the ankle fracture. However I think with proper placement of a bedside commode he should be able to pivot on the healthy right leg from bedside commode too bed.  I discussed the likelihood that he would undergo testing sooner, as possibly as soon as tomorrow, rather than delay studies until the ankle is sufficiently healed. 2. For now continue once daily oral protonix. 3. CBCs every 12 hours. 4. Since he is allowed clear liquid diet, I am going to reduce the rate of IV normal saline from  150 per hour to 75 mL per hour.   5.  Given the elevated glucose level, may want to consider getting a  hemoglobin A1c. We'll defer this decision to attending physicians   LOS: 1 day   [name]  04/05/2011, 11:59 AM Pager: [phone]  GI ATTENDING  HISTORY, LABS REVIEWED. AGREE WITH H&P AS OUTLINED ABOVE. RECURRENT GI BLEEDING WITH SYNCOPE. LIKELY DIVERTICULAR. CURRENTLY STABLE. PLAN COLONOSCOPY +/- EGD TOMORROW.The nature of the procedure, as well as the risks, benefits, and alternatives were carefully and thoroughly reviewed with the patient. Ample time for discussion and questions allowed. The patient understood, was satisfied, and agreed to proceed.   Wilhemina Bonito. Eda Keys., M.D. [facility] Division of Gastroenterology

## 2011-04-05 NOTE — Progress Notes (Signed)
Subjective: The patient is feeling well today. No more blood from his rectum although he has not moved his bowels. Will wait to see GI. Talked to him about the stability of his blood counts. He is otherwise feeling well.   Objective: Vital signs in last 24 hours: Filed Vitals:   04/05/11 0544 04/05/11 0546 04/05/11 0549 04/05/11 1408  BP: 163/96 163/96 188/128 186/96  Pulse: 83 101 96 86  Temp:    98.2 F (36.8 C)  TempSrc:    Oral  Resp:    18  Height:      Weight:      SpO2:    96%   Weight change:   Intake/Output Summary (Last 24 hours) at 04/05/11 1434 Last data filed at 04/05/11 1300  Gross per 24 hour  Intake   1360 ml  Output    500 ml  Net    860 ml   Physical Exam: General: resting in bed, obese HEENT: PERRL, EOMI, no scleral icterus Cardiac: RRR, no rubs, murmurs or gallops Pulm: clear to auscultation bilaterally, moving normal volumes of air Abd: soft, nontender, nondistended, BS present Ext: warm and well perfused, no pedal edema Neuro: alert and oriented X3, cranial nerves II-XII grossly intact  Lab Results: Basic Metabolic Panel:  Lab 04/04/11 1610  NA 144  K 3.4*  CL 111  CO2 26  GLUCOSE 138*  BUN 16  CREATININE 0.96  CALCIUM 7.8*  MG --  PHOS --   Liver Function Tests:  Lab 04/04/11 0940  AST 12  ALT 11  ALKPHOS 54  BILITOT 0.2*  PROT 5.7*  ALBUMIN 2.8*   CBC:  Lab 04/05/11 0840 04/05/11 0106 04/04/11 0940  WBC 10.2 10.5 --  NEUTROABS -- -- 10.9*  HGB 10.2* 10.1* --  HCT 31.0* 30.5* --  MCV 85.2 84.7 --  PLT 209 209 --   Cardiac Enzymes:  Lab 04/05/11 0835 04/05/11 0106 04/04/11 1743  CKTOTAL 118 138 173  CKMB 2.3 2.4 3.4  CKMBINDEX -- -- --  TROPONINI <0.30 <0.30 <0.30   Coagulation:  Lab 04/04/11 0940  LABPROT 14.6  INR 1.12   Studies/Results: Dg Chest 1 View  04/04/2011  *RADIOLOGY REPORT*  Clinical Data: History of a recent fall.  Dizziness.  CHEST - 1 VIEW  Comparison: Chest x-ray 07/16/2010.  Findings: Lung  volumes are normal.  No consolidative airspace disease.  No pleural effusions.  No pneumothorax.  No pulmonary nodule or mass noted.  Pulmonary vasculature and the cardiomediastinal silhouette are within normal limits.  IMPRESSION: 1. No radiographic evidence of acute cardiopulmonary disease.  Original Report Authenticated By: Florencia Reasons, M.D.   Dg Ankle Complete Left  04/04/2011  *RADIOLOGY REPORT*  Clinical Data: History of fall with injury to left ankle complaining of left-sided ankle pain.  LEFT ANKLE COMPLETE - 3+ VIEW  Comparison: No priors.  Findings: Multiple views of the left ankle demonstrate an acute nondisplaced fracture of the tip of the lateral malleolus, with overlying soft tissue swelling.  Alignment at the ankle mortise is preserved.  No additional acute fractures are identified.  IMPRESSION: 1.  Acute nondisplaced fracture of the tip of the lateral malleolus.  Original Report Authenticated By: Florencia Reasons, M.D.   Medications: I have reviewed the patient's current medications. Scheduled Meds:   . pantoprazole  40 mg Oral Q1200  . simvastatin  10 mg Oral QHS   Continuous Infusions:   . sodium chloride 75 mL/hr at 04/05/11 1251  PRN Meds:.acetaminophen Assessment/Plan:   GI bleed - GI PA has seen, will do EGD and possible colonoscopy in the future. Is on clear liquid diet at this time. No repeat bleeding and blood counts will be done q 12 hours.    Syncope and collapse - Likely due to blood loss. Currently no orthostasis or dizziness. Will continue to monitor for bleeding.    Ankle fracture, lateral malleolus, closed - Will get splinting and/or casting from PT.    Anemia - acute, due to GI blood loss  Disposition - Per GI studies but if hemodynamically stable and colonoscopy and EGD without acute findings may be stable for discharge tomorrow after studies.    LOS: 1 day   Genella Mech 04/05/2011, 2:34 PM

## 2011-04-05 NOTE — H&P (Signed)
IM Attending  57 man with little past medical hx and no meds except zocor, admitted for 2 days of passing dark stools and a dizzy fall with ankle fracture.  Hgb was 8.5 (tx 2 units to 10.1).  Says he has felt fine since admission.  No chest pain or SOB.  No nausea or vomiting.  Labs OK except hgb and albumin of 2.8.  Several BPs around 160/80 but most recent reading was higher. CXR nl.  EKG: non-specific T flattening.  Troponins negative. Have consulted GI.  Will probably need endoscopy.

## 2011-04-05 NOTE — Progress Notes (Signed)
Received pt from ED; blood going at 100, no suspected reaction

## 2011-04-06 ENCOUNTER — Encounter (HOSPITAL_COMMUNITY): Payer: Self-pay | Admitting: *Deleted

## 2011-04-06 ENCOUNTER — Encounter (HOSPITAL_COMMUNITY)
Admission: EM | Disposition: A | Discharge status: Home / Self Care | Payer: Self-pay | Source: Home / Self Care | Attending: Internal Medicine

## 2011-04-06 DIAGNOSIS — K5730 Diverticulosis of large intestine without perforation or abscess without bleeding: Secondary | ICD-10-CM | POA: Diagnosis present

## 2011-04-06 DIAGNOSIS — K449 Diaphragmatic hernia without obstruction or gangrene: Secondary | ICD-10-CM | POA: Diagnosis present

## 2011-04-06 DIAGNOSIS — K5731 Diverticulosis of large intestine without perforation or abscess with bleeding: Principal | ICD-10-CM

## 2011-04-06 DIAGNOSIS — D126 Benign neoplasm of colon, unspecified: Secondary | ICD-10-CM | POA: Diagnosis present

## 2011-04-06 LAB — CBC
HCT: 32.9 % — ABNORMAL LOW (ref 39.0–52.0)
MCH: 27.9 pg (ref 26.0–34.0)
MCHC: 32.5 g/dL (ref 30.0–36.0)
MCV: 85.7 fL (ref 78.0–100.0)
RDW: 14.9 % (ref 11.5–15.5)
WBC: 10.5 10*3/uL (ref 4.0–10.5)

## 2011-04-06 LAB — BASIC METABOLIC PANEL
BUN: 8 mg/dL (ref 6–23)
CO2: 24 mEq/L (ref 19–32)
Chloride: 113 mEq/L — ABNORMAL HIGH (ref 96–112)
Creatinine, Ser: 0.86 mg/dL (ref 0.50–1.35)
Glucose, Bld: 103 mg/dL — ABNORMAL HIGH (ref 70–99)

## 2011-04-06 SURGERY — COLONOSCOPY WITH ESOPHAGOGASTRODUODENOSCOPY (EGD)
Anesthesia: Moderate Sedation

## 2011-04-06 MED ORDER — MIDAZOLAM HCL 10 MG/2ML IJ SOLN
INTRAMUSCULAR | Status: DC | PRN
  Administered 2011-04-06 (×5): 2 mg via INTRAVENOUS

## 2011-04-06 MED ORDER — BUTAMBEN-TETRACAINE-BENZOCAINE 2-2-14 % EX AERO
INHALATION_SPRAY | CUTANEOUS | Status: DC | PRN
  Administered 2011-04-06: 2 via TOPICAL

## 2011-04-06 MED ORDER — FENTANYL CITRATE 0.05 MG/ML IJ SOLN
INTRAMUSCULAR | Status: AC
  Filled 2011-04-06: qty 4

## 2011-04-06 MED ORDER — MIDAZOLAM HCL 10 MG/2ML IJ SOLN
INTRAMUSCULAR | Status: AC
  Filled 2011-04-06: qty 4

## 2011-04-06 MED ORDER — FENTANYL CITRATE 0.05 MG/ML IJ SOLN
INTRAMUSCULAR | Status: DC | PRN
  Administered 2011-04-06 (×4): 25 ug via INTRAVENOUS

## 2011-04-06 NOTE — Progress Notes (Signed)
Subjective: The patient had his prep for colonoscopy and moved his bowels. Spots of blood in the movements but no gross blood. Talked to him about the stability of his blood counts. He is otherwise feeling well. Talked about possibility of going home if studies are normal.   Objective: Vital signs in last 24 hours: Filed Vitals:   04/05/11 0546 04/05/11 0549 04/05/11 1408 04/05/11 2100  BP: 163/96 188/128 186/96 171/87  Pulse: 101 96 86 86  Temp:   98.2 F (36.8 C) 98.5 F (36.9 C)  TempSrc:   Oral Oral  Resp:   18 20  Height:      Weight:      SpO2:   96% 94%   Weight change:   Intake/Output Summary (Last 24 hours) at 04/06/11 0753 Last data filed at 04/05/11 1929  Gross per 24 hour  Intake    840 ml  Output    875 ml  Net    -35 ml   Physical Exam: General: resting in bed, obese HEENT: PERRL, EOMI, no scleral icterus Cardiac: RRR, no rubs, murmurs or gallops Pulm: clear to auscultation bilaterally, moving normal volumes of air Abd: soft, nontender, nondistended, BS present Ext: warm and well perfused, no pedal edema Neuro: alert and oriented X3, cranial nerves II-XII grossly intact  Lab Results: Basic Metabolic Panel:  Lab 04/04/11 1610  NA 144  K 3.4*  CL 111  CO2 26  GLUCOSE 138*  BUN 16  CREATININE 0.96  CALCIUM 7.8*  MG --  PHOS --   Liver Function Tests:  Lab 04/04/11 0940  AST 12  ALT 11  ALKPHOS 54  BILITOT 0.2*  PROT 5.7*  ALBUMIN 2.8*   CBC:  Lab 04/05/11 1734 04/05/11 0840 04/04/11 0940  WBC 9.4 10.2 --  NEUTROABS -- -- 10.9*  HGB 10.0* 10.2* --  HCT 30.5* 31.0* --  MCV 86.2 85.2 --  PLT 219 209 --   Cardiac Enzymes:  Lab 04/05/11 0835 04/05/11 0106 04/04/11 1743  CKTOTAL 118 138 173  CKMB 2.3 2.4 3.4  CKMBINDEX -- -- --  TROPONINI <0.30 <0.30 <0.30   Coagulation:  Lab 04/04/11 0940  LABPROT 14.6  INR 1.12   Studies/Results: Dg Chest 1 View  04/04/2011  *RADIOLOGY REPORT*  Clinical Data: History of a recent fall.   Dizziness.  CHEST - 1 VIEW  Comparison: Chest x-ray 07/16/2010.  Findings: Lung volumes are normal.  No consolidative airspace disease.  No pleural effusions.  No pneumothorax.  No pulmonary nodule or mass noted.  Pulmonary vasculature and the cardiomediastinal silhouette are within normal limits.  IMPRESSION: 1. No radiographic evidence of acute cardiopulmonary disease.  Original Report Authenticated By: Florencia Reasons, M.D.   Dg Ankle Complete Left  04/04/2011  *RADIOLOGY REPORT*  Clinical Data: History of fall with injury to left ankle complaining of left-sided ankle pain.  LEFT ANKLE COMPLETE - 3+ VIEW  Comparison: No priors.  Findings: Multiple views of the left ankle demonstrate an acute nondisplaced fracture of the tip of the lateral malleolus, with overlying soft tissue swelling.  Alignment at the ankle mortise is preserved.  No additional acute fractures are identified.  IMPRESSION: 1.  Acute nondisplaced fracture of the tip of the lateral malleolus.  Original Report Authenticated By: Florencia Reasons, M.D.   Medications: I have reviewed the patient's current medications. Scheduled Meds:    . pantoprazole  40 mg Oral Q1200  . peg 3350 powder  1 kit Oral Once  .  simvastatin  10 mg Oral QHS   Continuous Infusions:    . sodium chloride 75 mL/hr at 04/05/11 1251   PRN Meds:.acetaminophen Assessment/Plan:   GI bleed - GI will do EGD and colonoscopy today.  Is NPO at this time. No repeat bleeding and blood counts have been stable for last day after transfusion.   Syncope and collapse - Likely due to blood loss. Currently no orthostasis or dizziness. Will continue to monitor for bleeding.    Ankle fracture, lateral malleolus, closed - Will get splinting and/or casting from PT.    Anemia - acute, due to GI blood loss  Disposition - Per GI studies but if hemodynamically stable and colonoscopy and EGD without acute findings may be stable for discharge today after studies.    LOS: 2  days   Genella Mech 04/06/2011, 7:53 AM

## 2011-04-06 NOTE — Progress Notes (Signed)
Discharge instructions gone over with patient. Discharged by wheelchair to private vehicle to home.

## 2011-04-06 NOTE — Discharge Summary (Signed)
Internal Medicine Teaching [facility] Discharge Note  Name: Steven Vargas MRN: [id number] DOB: 1935-05-13 76 y.o.  Date of Admission: 04/04/2011  9:11 AM Date of Discharge: 04/06/2011 Attending Physician: Dr. Ulyess Mort  Discharge Diagnosis: Active Problems:  Hyperglycemia  Syncope and collapse  Ankle fracture, lateral malleolus, closed  Acute posthemorrhagic anemia  Blood in stool  Acute GI bleeding  Diverticulosis of colon (without mention of hemorrhage)  Benign neoplasm of colon  Diverticulosis of colon with hemorrhage  Diaphragmatic hernia without mention of obstruction or gangrene  Discharge Medications: Medication List  As of 04/08/2011  8:50 AM   TAKE these medications         meloxicam 7.5 MG tablet   Commonly known as: MOBIC   Take 7.5 mg by mouth daily as needed. For pain      simvastatin 10 MG tablet   Commonly known as: ZOCOR   Take 10 mg by mouth at bedtime.            Disposition and follow-up:   Mr.Steven Vargas was discharged from [facility] in Stable condition.    Follow-up Appointments: Follow-up Information    Follow up with Raliegh Ip, MD. Call in 2 days. (Please make appointment to be seen in 1-2 weeks.)    Contact information:   [phone]       Follow up with [facility]  . Call in 3 days. (For an appointment in 2-3 weeks to get a case on your foot. )    Contact information:   [address] [place] [address] [phone]        Discharge Orders    Future Orders Please Complete By Expires   Diet - low sodium heart healthy      Increase activity slowly      Call MD for:  temperature >100.4      Call MD for:  persistant nausea and vomiting      Call MD for:  severe uncontrolled pain      Call MD for:  difficulty breathing, headache or visual disturbances      Call MD for:  hives      Call MD for:  persistant dizziness or light-headedness      Call MD for:  extreme fatigue          Consultations:    Procedures Performed:  Dg Chest 1 View  04/04/2011  *RADIOLOGY REPORT*  Clinical Data: History of a recent fall.  Dizziness.  CHEST - 1 VIEW  Comparison: Chest x-ray 07/16/2010.  Findings: Lung volumes are normal.  No consolidative airspace disease.  No pleural effusions.  No pneumothorax.  No pulmonary nodule or mass noted.  Pulmonary vasculature and the cardiomediastinal silhouette are within normal limits.  IMPRESSION: 1. No radiographic evidence of acute cardiopulmonary disease.  Original Report Authenticated By: Florencia Reasons, M.D.   Dg Ankle Complete Left  04/04/2011  *RADIOLOGY REPORT*  Clinical Data: History of fall with injury to left ankle complaining of left-sided ankle pain.  LEFT ANKLE COMPLETE - 3+ VIEW  Comparison: No priors.  Findings: Multiple views of the left ankle demonstrate an acute nondisplaced fracture of the tip of the lateral malleolus, with overlying soft tissue swelling.  Alignment at the ankle mortise is preserved.  No additional acute fractures are identified.  IMPRESSION: 1.  Acute nondisplaced fracture of the tip of the lateral malleolus.  Original Report Authenticated By: Florencia Reasons, M.D.   EGD: PATIENT: Steven Vargas  MR#: [id number]  BIRTHDATE: [birth year], 75 yrs. old GENDER: male  ENDOSCOPIST: Wilhemina Bonito. Eda Keys, MD  Referred by: Loma Sender, M.D.  PROCEDURE DATE: 04/06/2011  PROCEDURE: EGD, diagnostic 43235  ASA CLASS: Class II  INDICATIONS: red rectal bleeding ; hx PUD  MEDICATIONS: There was residual sedation effect present from prior procedure.  TOPICAL ANESTHETIC: Cetacaine Spray  DESCRIPTION OF PROCEDURE: After the risks benefits and alternatives of the procedure were thoroughly explained, informed consent was obtained. The Pentax Gastroscope [id number] endoscope  was introduced through the mouth and advanced to the second portion of the duodenum, without limitations. The instrument was slowly withdrawn as the mucosa  was fully examined. The upper, middle, and distal third of the esophagus were carefully inspected and no abnormalities were noted. The z-line was well seen at the GEJ. The endoscope was pushed into the fundus which was normal including a retroflexed view. The antrum,gastric body, first and second part of the duodenum were unremarkable except for mild deormity of bulb/D2 junction and incidental bulbar diverticulum. Retroflexed views revealed a hiatal hernia. The  scope was then withdrawn from the patient and the procedure completed.  COMPLICATIONS: None  ENDOSCOPIC IMPRESSION:  1) Deformity in the bulb/descending duodenum and duodenal  diverticulum  2) Otherwise Normal EGD  RECOMMENDATIONS:  1) ADVANCE DIET  2) OK TO END HOME IN AM  3) GI FOLOW UP PRN. WILL SIN OFF  COLONOSCOPY: PATIENT: Steven, Vargas MR#: [id number]  BIRTHDATE: 08/07/1935, 75 yrs. old GENDER: male  ENDOSCOPIST: Wilhemina Bonito. Eda Keys, MD  REF. BY: Teaching Service - C. Pillips  PROCEDURE DATE: 04/06/2011  PROCEDURE: Colonoscopy with snare polypectomy x 3  ASA CLASS: Class II  INDICATIONS: rectal bleeding  MEDICATIONS: Fentanyl 100 mcg IV, Versed 10 mg IV  DESCRIPTION OF PROCEDURE: After the risks benefits and alternatives of the procedure were thoroughly explained, informed consent was obtained. Digital rectal exam was performed and revealed no abnormalities. The Pentax Colonoscope [id number], [id number]) and [id number]) endoscope was introduced through the anus and advanced to the cecum, which was identified by both the appendix and ileocecal valve, without limitations. The quality of the prep was excellent, using MoviPrep. The instrument was then slowly withdrawn as the colon was fully examined.   FINDINGS: Three polyps were found- 48mm,3mm in the ascending and 5mm in transverse colon. Polyps were snared without cautery. Retrieval was successful. Severe diverticulosis was found ascending colon to sigmoid colon. Otherwise  normal colonoscopy without other polyps, masses, vascular ectasias, or inflammatory changes. no active bleeding or blood in colon. Retroflexed views in the rectum revealed internal hemorrhoids. The time to cecum = 4 minutes. The scope was then withdrawn in 16 minutes from the cecum and the procedure completed.  COMPLICATIONS: None  ENDOSCOPIC IMPRESSION:  1) Three polyps - removed  2) Severe diverticulosis ascending colon to sigmoid colon  3) Otherwise normal colonoscopy  4) No active bleeding or blood in colon. SUPECT SELF LIMITED  DIVERTICULAR BLEED  5) Internal hemorrhoids  RECOMMENDATIONS:  1) Follow up colonoscopy in 5 years  2) Upper endoscopy will be scheduled TODAY   Admission HPI:  Patient is a 76 y.o. male with a PMHx of hyperlipidemia was completely fine until Saturday ( 4 days ago ) evening. He went to have a bowel movement after his dinner on Saturday when he noticed the toilet bowl was filled with blood. After this episode he continued to have bloody bowel movements every 15 minutes that evening. The amount of blood stay the  same and all the bowel movements. He had about 6 or 7 such bowel movements before that stopped and he went to sleep. He was fine after that until yesterday evening when he had the same episode of about 8-10 bloody bowel movements throughout the night . After his last bowel movement he became dizzy in the toilet and had a fall and he injured his left ankle. He did not have any pain in his belly or rectum. He denies any nausea, vomiting, constipation or diarrhea. He denies similar symptoms in the past. He takes Mobic for his right hip pain but does last dose was 2 weeks ago. No family history of colon cancers. No history of hemorrhoids. No chest pain, shortness of breath, weakness in his arms or legs or any other complaints.   Hospital Course by problem list:  Acute GI bleeding - The patient did present with lower GI bleeding causing bright red blood per rectum and  did lose enough blood to become dizzy and fall and break his ankle. He did receive two units of packed red blood cells while he was here. After this his hemoglobins were stablized and he did not have repeat incidents while he was here. EGD and colonoscopy were performed which found diverticulosis without active bleeding, 3 polyps were removed, and diaphragmatic hernia was incidentally noted. After procedure he was tolerating food appropriately and was stable to be discharged home. He was told to follow up with his PCP and to follow up with GI as needed if he has repeat bleeding. He was discharged home in stable condition with appropriate follow up.    Hyperglycemia - Blood sugar on initial BMET was slightly elevated at 138. Would recommend to his PCP to check a fasting blood sugar on him and if elevated to get HgA1C.    Ankle fracture, lateral malleolus, closed - Pt did have splinting during this hospitalization for his ankle and pain control while he was here. He was able to be discharged home on OTC medications as his pain was much diminished by the time of discharge. He was instructed to follow up with orthopedics to have casting done when the swelling is reduced in 2-3 weeks.    Acute posthemorrhagic anemia - Likely due to acute GI bleed however may be prudent to recheck a CBC at follow up visit to ensure not a long term anemia.   Diverticulosis of colon (without mention of hemorrhage) - See first problem. Likely caused the GI bleeding during this hospitalization.    Benign neoplasm of colon - 3 polyps were removed during this hospitalization and recommended repeat colonoscopy in 5 years.   Diaphragmatic hernia without mention of obstruction or gangrene - incidentally noted on EGD. No complications of that at this time.   Elevated BP - BPs were elevated in the hospital and may represent hypertension versus acute pain and discomfort in the hospital. Will need repeat blood pressure measurements as an  out-patient and the appropriate treatment if indicated. BPs were in the 170/100s while he was in the hospital during an acutely painful and uncomfortable experience.    Discharge Vitals:  BP 168/105  Pulse 99  Temp(Src) 98.2 F (36.8 C) (Oral)  Resp 17  Ht 5\' 8"  (1.727 m)  Wt 230 lb 13.2 oz (104.7 kg)  BMI 35.10 kg/m2  SpO2 95%  Discharge Labs:  CBC: WBC 10.5 Hg 10.7 Platelets 239 MCV 85.7 BMP: Na 146, K 3.6, Cl 113, bicarb 24, BUN 8, Cr 0.86, Glucose  103, Ca 8.0, GFR > 90  Signed: KOLLAR, Larue Drawdy 04/08/2011, 8:50 AM

## 2011-04-06 NOTE — Discharge Instructions (Addendum)
You were seen for a bleed that we think is coming from your colon. They did a colonoscopy and found 3 polyps which they removed. They also found some diverticulosis which probably caused the bleeding. The bleeding is stopped now. It is recommended that you have a repeat colonoscopy in 5 years.  Please follow up with your doctor in the next week or so. Please call to make an appointment with the orthopedic doctor to get a cast on your leg in 2-3 weeks when the swelling has gone down. Until then keep the splint on your leg. If you have any more bleeding please either call your doctor or come to the emergency room.

## 2011-04-06 NOTE — Op Note (Signed)
[facility] [address] [place], [place]  [address]  ENDOSCOPY PROCEDURE REPORT  PATIENT:  Steven Vargas, Steven Vargas  MR#:  [id number] BIRTHDATE:  1935/03/06, 75 yrs. old  GENDER:  male  ENDOSCOPIST:  Wilhemina Bonito. Eda Keys, MD Referred by:  Loma Sender, M.D.  PROCEDURE DATE:  04/06/2011 PROCEDURE:  EGD, diagnostic 43235 ASA CLASS:  Class II INDICATIONS:  red rectal bleeding ; hx PUD  MEDICATIONS:   There was residual sedation effect present from prior procedure. TOPICAL ANESTHETIC:  Cetacaine Spray  DESCRIPTION OF PROCEDURE:   After the risks benefits and alternatives of the procedure were thoroughly explained, informed consent was obtained.  The Pentax Gastroscope [id number] endoscope was introduced through the mouth and advanced to the second portion of the duodenum, without limitations.  The instrument was slowly withdrawn as the mucosa was fully examined. <<PROCEDUREIMAGES>>  The upper, middle, and distal third of the esophagus were carefully inspected and no abnormalities were noted. The z-line was well seen at the GEJ. The endoscope was pushed into the fundus which was normal including a retroflexed view. The antrum,gastric body, first and second part of the duodenum were unremarkable except for mild deormity of bulb/D2 junction and incidental bulbar diverticulum.   Retroflexed views revealed a hiatal hernia.    The scope was then withdrawn from the patient and the procedure completed.  COMPLICATIONS:  None  ENDOSCOPIC IMPRESSION: 1) Deformity in the bulb/descending duodenum and duodenal diverticulum 2) Otherwise Normal EGD  RECOMMENDATIONS: 1) ADVANCE DIET 2) OK TO END HOME IN AM 3) GI FOLOW UP PRN. WILL SIN OFF  ______________________________ Wilhemina Bonito. Eda Keys, MD  CC:  Loma Sender, MDThe Patient  n. Rosalie DoctorWilhemina Bonito. Eda Keys at 04/06/2011 12:56 PM  [name], [phone]

## 2011-04-06 NOTE — Op Note (Signed)
[facility] [address] [place], [place]  [address]  COLONOSCOPY PROCEDURE REPORT  PATIENT:  Steven Vargas, Steven Vargas  MR#:  [id number] BIRTHDATE:  1935-02-11, 75 yrs. old  GENDER:  male ENDOSCOPIST:  Wilhemina Bonito. Eda Keys, MD REF. BY:  Teaching Service - C. Pillips PROCEDURE DATE:  04/06/2011 PROCEDURE:  Colonoscopy with snare polypectomy x 3 ASA CLASS:  Class II INDICATIONS:  rectal bleeding MEDICATIONS:   Fentanyl 100 mcg IV, Versed 10 mg IV  DESCRIPTION OF PROCEDURE:   After the risks benefits and alternatives of the procedure were thoroughly explained, informed consent was obtained.  Digital rectal exam was performed and revealed no abnormalities.   The Pentax Colonoscope [id number], [id number] [id number]) and [id number]) endoscope was introduced through the anus and advanced to the cecum, which was identified by both the appendix and ileocecal valve, without limitations.  The quality of the prep was excellent, using MoviPrep.  The instrument was then slowly withdrawn as the colon was fully examined. <<PROCEDUREIMAGES>>  FINDINGS: Three polyps were found- 16mm,3mm in the ascending and 5mm in transverse colon. Polyps were snared without cautery. Retrieval was successful.  Severe diverticulosis was found ascending colon to sigmoid colon.  Otherwise normal colonoscopy without other polyps, masses, vascular ectasias, or inflammatory changes.  no active bleeding or blood in colon.  Retroflexed views in the rectum revealed internal hemorrhoids. The time to cecum = 4 minutes. The scope was then withdrawn in 16 minutes from the cecum and the procedure completed. COMPLICATIONS:  None  ENDOSCOPIC IMPRESSION: 1) Three polyps - removed 2) Severe diverticulosis ascending colon to sigmoid colon 3) Otherwise normal colonoscopy 4) No active bleeding or blood in colon. SUPECT SELF LIMITED DIVERTICULAR BLEED 5) Internal hemorrhoids RECOMMENDATIONS: 1) Follow up colonoscopy in 5  years 2) Upper endoscopy will be scheduled TODAY  ______________________________ Wilhemina Bonito. Eda Keys, MD  CC:  The Patient; Charles Phillips,MD  n. eSIGNED:   Wilhemina Bonito. Eda Keys at 04/06/2011 12:48 PM  [name], [phone]

## 2011-04-09 ENCOUNTER — Encounter: Payer: Self-pay | Admitting: Internal Medicine

## 2013-03-08 IMAGING — CR DG ANKLE COMPLETE 3+V*L*
3 series · 3 of 3 positions shown · non-contrast
Comparison: No priors.

CLINICAL DATA: History of fall with injury to left ankle
complaining of left-sided ankle pain.

LEFT ANKLE COMPLETE - 3+ VIEW

[x ankle ap left]
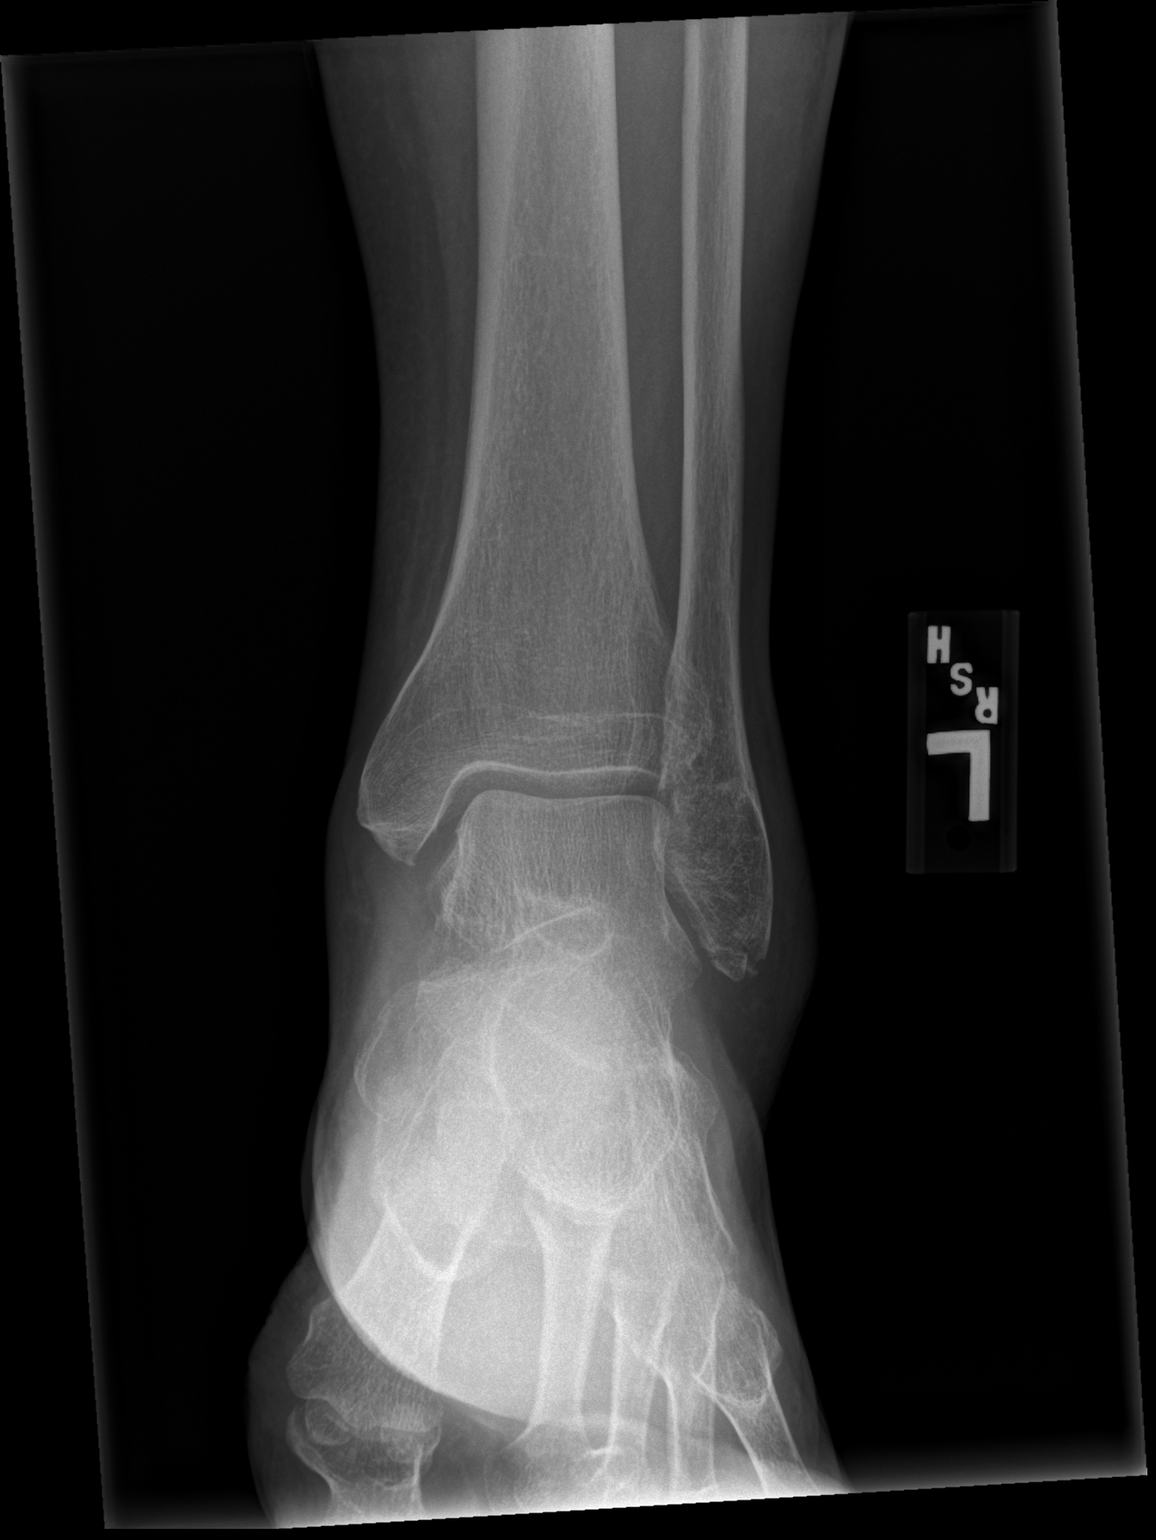

[x ankle obl left]
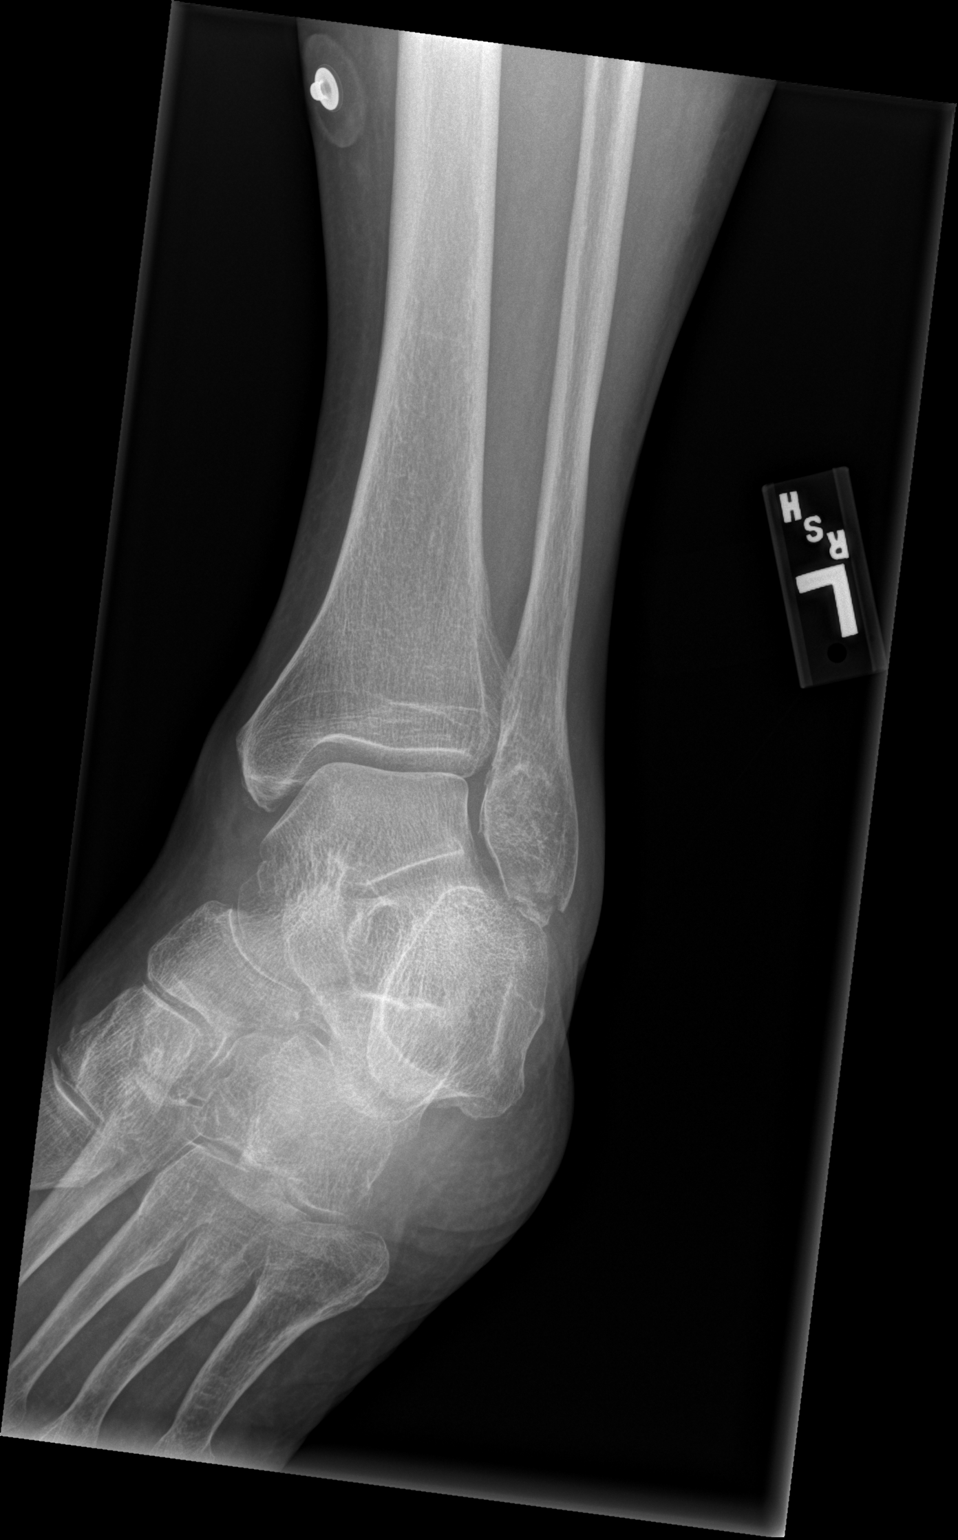

[x ankle lat left]
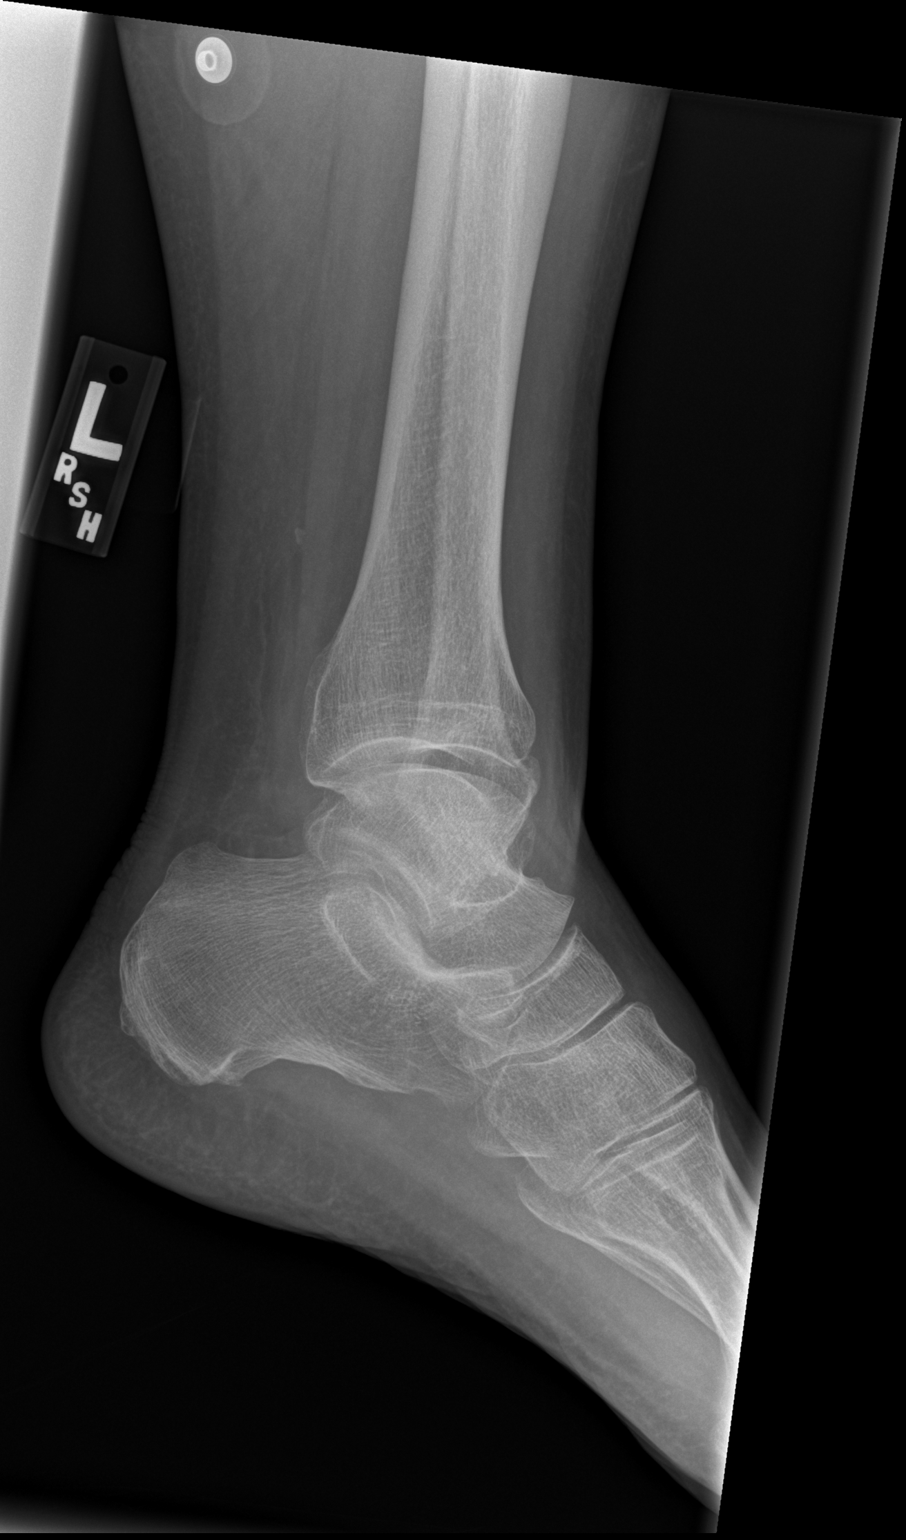

[3 of 3 positions shown; findings below may reference images not displayed]

FINDINGS: Multiple views of the left ankle demonstrate an acute
nondisplaced fracture of the tip of the lateral malleolus, with
overlying soft tissue swelling.  Alignment at the ankle mortise is
preserved.  No additional acute fractures are identified.
IMPRESSION: 1.  Acute nondisplaced fracture of the tip of the lateral
malleolus.

## 2014-12-14 ENCOUNTER — Ambulatory Visit (INDEPENDENT_AMBULATORY_CARE_PROVIDER_SITE_OTHER): Payer: Medicare Other | Admitting: Family Medicine

## 2014-12-14 VITALS — BP 146/98 | HR 114 | Temp 98.4°F | Resp 18 | Ht 64.5 in | Wt 203.4 lb

## 2014-12-14 DIAGNOSIS — I10 Essential (primary) hypertension: Secondary | ICD-10-CM

## 2014-12-14 DIAGNOSIS — Z5181 Encounter for therapeutic drug level monitoring: Secondary | ICD-10-CM | POA: Diagnosis not present

## 2014-12-14 DIAGNOSIS — D62 Acute posthemorrhagic anemia: Secondary | ICD-10-CM

## 2014-12-14 DIAGNOSIS — Z87891 Personal history of nicotine dependence: Secondary | ICD-10-CM

## 2014-12-14 LAB — POCT CBC
GRANULOCYTE PERCENT: 72.4 % (ref 37–80)
HEMATOCRIT: 48.8 % (ref 43.5–53.7)
HEMOGLOBIN: 16.4 g/dL (ref 14.1–18.1)
Lymph, poc: 1.9 (ref 0.6–3.4)
MCH: 27.9 pg (ref 27–31.2)
MCHC: 33.6 g/dL (ref 31.8–35.4)
MCV: 83 fL (ref 80–97)
MID (cbc): 0.6 (ref 0–0.9)
MPV: 8.1 fL (ref 0–99.8)
POC GRANULOCYTE: 6.7 (ref 2–6.9)
POC LYMPH PERCENT: 20.7 %L (ref 10–50)
POC MID %: 6.9 %M (ref 0–12)
Platelet Count, POC: 208 10*3/uL (ref 142–424)
RBC: 5.88 M/uL (ref 4.69–6.13)
RDW, POC: 14.9 %
WBC: 9.3 10*3/uL (ref 4.6–10.2)

## 2014-12-14 LAB — COMPREHENSIVE METABOLIC PANEL
ALBUMIN: 4.4 g/dL (ref 3.6–5.1)
ALT: 23 U/L (ref 9–46)
AST: 19 U/L (ref 10–35)
Alkaline Phosphatase: 62 U/L (ref 40–115)
BUN: 19 mg/dL (ref 7–25)
CHLORIDE: 98 mmol/L (ref 98–110)
CO2: 31 mmol/L (ref 20–31)
CREATININE: 1.12 mg/dL (ref 0.70–1.18)
Calcium: 9.6 mg/dL (ref 8.6–10.3)
Glucose, Bld: 97 mg/dL (ref 65–99)
POTASSIUM: 4.3 mmol/L (ref 3.5–5.3)
SODIUM: 140 mmol/L (ref 135–146)
Total Bilirubin: 0.6 mg/dL (ref 0.2–1.2)
Total Protein: 7.4 g/dL (ref 6.1–8.1)

## 2014-12-14 MED ORDER — AMLODIPINE BESYLATE 5 MG PO TABS: 5.0000 mg | ORAL_TABLET | Freq: Every day | ORAL | Status: DC

## 2014-12-14 MED ORDER — LISINOPRIL-HYDROCHLOROTHIAZIDE 20-25 MG PO TABS
1.0000 | ORAL_TABLET | Freq: Every day | ORAL | Status: DC
Start: 2014-12-14 — End: 2015-03-09

## 2014-12-14 NOTE — Patient Instructions (Addendum)
Purchase a blood pressure cuff - a wrist cuff should be fine - record your blood pressure and bring it to follow up.  If your blood pressure is > 160 on the top or >100 on the bottom several times, please call so we can increase your blood pressure medication. Remember to be fasting prior to your next visit.  You should take your medications that morning with water or black coffee but do not eat after midnight the evening prior so we can check your blood sugar and cholesterol.  Bring any prior medical records or any old medication lists or bottles that you have with you to that visit. Managing Your High Blood Pressure Blood pressure is a measurement of how forceful your blood is pressing against the walls of the arteries. Arteries are muscular tubes within the circulatory system. Blood pressure does not stay the same. Blood pressure rises when you are active, excited, or nervous; and it lowers during sleep and relaxation. If the numbers measuring your blood pressure stay above normal most of the time, you are at risk for health problems. High blood pressure (hypertension) is a long-term (chronic) condition in which blood pressure is elevated. A blood pressure reading is recorded as two numbers, such as 120 over 80 (or 120/80). The first, higher number is called the systolic pressure. It is a measure of the pressure in your arteries as the heart beats. The second, lower number is called the diastolic pressure. It is a measure of the pressure in your arteries as the heart relaxes between beats.  Keeping your blood pressure in a normal range is important to your overall health and prevention of health problems, such as heart disease and stroke. When your blood pressure is uncontrolled, your heart has to work harder than normal. High blood pressure is a very common condition in adults because blood pressure tends to rise with age. Men and women are equally likely to have hypertension but at different times in  life. Before age 57, men are more likely to have hypertension. After 77 years of age, women are more likely to have it. Hypertension is especially common in African Americans. This condition often has no signs or symptoms. The cause of the condition is usually not known. Your caregiver can help you come up with a plan to keep your blood pressure in a normal, healthy range. BLOOD PRESSURE STAGES Blood pressure is classified into four stages: normal, prehypertension, stage 1, and stage 2. Your blood pressure reading will be used to determine what type of treatment, if any, is necessary. Appropriate treatment options are tied to these four stages:  Normal  Systolic pressure (mm Hg): below 120.  Diastolic pressure (mm Hg): below 80. Prehypertension  Systolic pressure (mm Hg): 120 to 139.  Diastolic pressure (mm Hg): 80 to 89. Stage1  Systolic pressure (mm Hg): 140 to 159.  Diastolic pressure (mm Hg): 90 to 99. Stage2  Systolic pressure (mm Hg): 160 or above.  Diastolic pressure (mm Hg): 100 or above. RISKS RELATED TO HIGH BLOOD PRESSURE Managing your blood pressure is an important responsibility. Uncontrolled high blood pressure can lead to:  A heart attack.  A stroke.  A weakened blood vessel (aneurysm).  Heart failure.  Kidney damage.  Eye damage.  Metabolic syndrome.  Memory and concentration problems. HOW TO MANAGE YOUR BLOOD PRESSURE Blood pressure can be managed effectively with lifestyle changes and medicines (if needed). Your caregiver will help you come up with a plan to bring your  blood pressure within a normal range. Your plan should include the following: Education  Read all information provided by your caregivers about how to control blood pressure.  Educate yourself on the latest guidelines and treatment recommendations. New research is always being done to further define the risks and treatments for high blood pressure. Lifestylechanges  Control your  weight.  Avoid smoking.  Stay physically active.  Reduce the amount of salt in your diet.  Reduce stress.  Control any chronic conditions, such as high cholesterol or diabetes.  Reduce your alcohol intake. Medicines  Several medicines (antihypertensive medicines) are available, if needed, to bring blood pressure within a normal range. Communication  Review all the medicines you take with your caregiver because there may be side effects or interactions.  Talk with your caregiver about your diet, exercise habits, and other lifestyle factors that may be contributing to high blood pressure.  See your caregiver regularly. Your caregiver can help you create and adjust your plan for managing high blood pressure. RECOMMENDATIONS FOR TREATMENT AND FOLLOW-UP  The following recommendations are based on current guidelines for managing high blood pressure in nonpregnant adults. Use these recommendations to identify the proper follow-up period or treatment option based on your blood pressure reading. You can discuss these options with your caregiver.  Systolic pressure of 123456 to [id number] or diastolic pressure of 80 to 89: Follow up with your caregiver as directed.  Systolic pressure of [id number] to 0000000 or diastolic pressure of 90 to 100: Follow up with your caregiver within 2 months.  Systolic pressure above 0000000 or diastolic pressure above [id number]: Follow up with your caregiver within 1 month.  Systolic pressure above [id number] or diastolic pressure above [id number]: Consider antihypertensive therapy; follow up with your caregiver within 1 week.  Systolic pressure above [id number] or diastolic pressure above 123456: Begin antihypertensive therapy; follow up with your caregiver within 1 week.   This information is not intended to replace advice given to you by your health care provider. Make sure you discuss any questions you have with your health care provider.   Document Released: 10/10/2011 Document Reviewed:  10/10/2011 Elsevier Interactive Patient Education [employer].

## 2014-12-14 NOTE — Progress Notes (Signed)
Subjective:    Patient ID: Steven Vargas, male    DOB: 02-07-1935, 79 y.o.   MRN: [id number] This chart was scribed for Delman Cheadle, MD by [name], Medical Scribe. This patient was seen in Room 10 and the patient's care was started at 3:17 PM.   Chief Complaint  Patient presents with  . Establish Care    HPI HPI Comments: Steven Vargas is a 79 y.o. male with a history of CVA and acute GI bleeding (4 years ago) who presents to the [facility] to establish care and to refill medications.  Patient notes that his PCP, Dr. Macario Carls. (79 years old), had a heart attack this past February, around the time he was supposed to have an appointment. He was rescheduled 6 months later and notes that Dr. Hardin Negus was in a walker then and looked frail. His wife went to the clinic 3 weeks later for her appointment to find out that the clinic closed down. The clinic was a private practice with Dr. Hardin Negus as the sole provider. They had tried getting in contact with him but without avail. They think he may have passed away unfortunately. As a result, he has been unable to obtain his medications through [facility]. He was recommended to come here to establish care and has an appt to establish with my excellent colleague Dr. Carlota Raspberry in 3 mos.  Patient has not been checking his blood pressure at home as he does not have a blood pressure cuff. He believes his blood pressure is elevated today (150/114) due to increased stress (mortgage issues, upset over past 2 days). He states his blood pressure had always been normal at prior office visits. He has been avoiding salt and sugar since his CVA of which he has no persistent symptoms. He has not had any GI issues since his GI bleed 4 years ago. He has been eating, drinking, and sleeping normally. Patient denies leg swelling, headaches, chest pain and SOB. He also denies history of MI and other cardiac issues. He does have a history of smoking, but quit 10 years  ago. His last physical exam was about a year ago.  Past Medical History  Diagnosis Date  . HLD (hyperlipidemia)   . Gastric ulcer     secondary to nsaid 10 years ago  . Hip pain, chronic     right  . GI bleed 04/04/2011  . Obesity (BMI 35.0-39.9 without comorbidity) ([place]) 03/2011  . Hypertension   . Stroke [facility])     2005   Past Surgical History  Procedure Laterality Date  . Tonsillectomy     Current Outpatient Prescriptions on File Prior to Visit  Medication Sig Dispense Refill  . meloxicam (MOBIC) 7.5 MG tablet Take 7.5 mg by mouth daily as needed. For pain     No current facility-administered medications on file prior to visit.   No Known Allergies Family History  Problem Relation Age of Onset  . Stroke Mother   . Kidney disease Brother   . Diabetes Brother    Social History   Social History  . Marital Status: Married    Spouse Name: [name]  . Number of Children: [name]  . Years of Education: [name]   Social History Main Topics  . Smoking status: Former Research scientist (life sciences)  . Smokeless tobacco: Never Used  . Alcohol Use: Yes     Comment: occasional  . Drug Use: No  . Sexual Activity: Not Currently   Other Topics  Concern  . None   Social History Narrative   Lives in [place], [place] with his wife and son. 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 Drinks 2-3 beers once every few weeks. Smoking 2 years ago. Used to smoke 2-3 packs per day before that for past many years. Never did illicit drugs. Has [employer] health care Medicare.   Depression screen PHQ 2/9 12/14/2014  Decreased Interest 0  Down, Depressed, Hopeless 0  PHQ - 2 Score 0     Review of Systems  Constitutional: Negative for fever, chills, activity change, appetite change and unexpected weight change.  Respiratory: Negative for cough, chest tightness and shortness of breath.   Cardiovascular: Negative for chest pain, palpitations and leg swelling.  Genitourinary: Negative for frequency, enuresis and  difficulty urinating.  Musculoskeletal: Positive for arthralgias. Negative for myalgias.  Neurological: Negative for headaches.  Psychiatric/Behavioral: Negative for confusion and dysphoric mood. The patient is not nervous/anxious.        Objective:  BP 150/114 mmHg  Pulse 114  Temp(Src) 98.4 F (36.9 C) (Oral)  Resp 18  Ht 5' 4.5" (1.638 m)  Wt 203 lb 6.4 oz (92.262 kg)  BMI 34.39 kg/m2  SpO2 97%  Physical Exam  Constitutional: He is oriented to person, place, and time. He appears well-developed and well-nourished. No distress.  HENT:  Head: Normocephalic and atraumatic.  Mouth/Throat: Oropharynx is clear and moist. No oropharyngeal exudate.  Eyes: Pupils are equal, round, and reactive to light.  Neck: Neck supple.  Cardiovascular: Regular rhythm, S1 normal, S2 normal and normal heart sounds.  Tachycardia present.   No murmur heard. Pulmonary/Chest: Effort normal and breath sounds normal. No respiratory distress. He has no wheezes. He has no rales.  Clear to auscultation bilaterally.   Musculoskeletal: He exhibits no edema.  Neurological: He is alert and oriented to person, place, and time. No cranial nerve deficit.  Skin: Skin is warm and dry. No rash noted.  Psychiatric: He has a normal mood and affect. His behavior is normal.  Nursing note and vitals reviewed.         Assessment & Plan:   1. Essential hypertension, benign   2. Acute posthemorrhagic anemia   3. Medication monitoring encounter   4. History of tobacco use     Try to check BP outside office. Cont lisinopril-hctz and start amlodipine 5. Follow up at new pt appt with Dr. Carlota Raspberry for additional refills.   We had pt sign release to obtain prior records but called their provider Dr. Ladoris Gene office during business hours today and it just kept ringing - no voicemail.  [facility] does show Dr. Macario Carls as still practicing - he has renewed his license this year.  [place] who Dr. Hardin Negus  IS associated with and they are not aware of any change in his status or in his practice. . . .   Can have staff try again at pt's f/u visit - would be good to get prior records. . . .   Orders Placed This Encounter  Procedures  . Comprehensive metabolic panel  . POCT CBC    Meds ordered this encounter  Medications  . DISCONTD: lisinopril-hydrochlorothiazide (PRINZIDE,ZESTORETIC) 20-25 MG tablet    Sig: Take 1 tablet by mouth daily.  Marland Kitchen lisinopril-hydrochlorothiazide (PRINZIDE,ZESTORETIC) 20-25 MG tablet    Sig: Take 1 tablet by mouth daily.    Dispense:  90 tablet    Refill:  0  . amLODipine (NORVASC)  5 MG tablet    Sig: Take 1 tablet (5 mg total) by mouth daily.    Dispense:  90 tablet    Refill:  0    I personally performed the services described in this documentation, which was scribed in my presence. The recorded information has been reviewed and considered, and addended by me as needed.  Delman Cheadle, MD MPH   By signing my name below, I, [name], attest that this documentation has been prepared under the direction and in the presence of Delman Cheadle, MD.  Electronically Signed: [name], Medical Scribe. 12/14/2014. 3:17 PM.  Results for orders placed or performed in visit on 12/14/14  Comprehensive metabolic panel  Result Value Ref Range   Sodium 140 135 - 146 mmol/L   Potassium 4.3 3.5 - 5.3 mmol/L   Chloride 98 98 - 110 mmol/L   CO2 31 20 - 31 mmol/L   Glucose, Bld 97 65 - 99 mg/dL   BUN 19 7 - 25 mg/dL   Creat 1.12 0.70 - 1.18 mg/dL   Total Bilirubin 0.6 0.2 - 1.2 mg/dL   Alkaline Phosphatase 62 40 - 115 U/L   AST 19 10 - 35 U/L   ALT 23 9 - 46 U/L   Total Protein 7.4 6.1 - 8.1 g/dL   Albumin 4.4 3.6 - 5.1 g/dL   Calcium 9.6 8.6 - 10.3 mg/dL  POCT CBC  Result Value Ref Range   WBC 9.3 4.6 - 10.2 K/uL   Lymph, poc 1.9 0.6 - 3.4   POC LYMPH PERCENT 20.7 10 - 50 %L   MID (cbc) 0.6 0 - 0.9   POC MID % 6.9 0 - 12 %M   POC Granulocyte 6.7 2 - 6.9   Granulocyte  percent 72.4 37 - 80 %G   RBC 5.88 4.69 - 6.13 M/uL   Hemoglobin 16.4 14.1 - 18.1 g/dL   HCT, POC 48.8 43.5 - 53.7 %   MCV 83.0 80 - 97 fL   MCH, POC 27.9 27 - 31.2 pg   MCHC 33.6 31.8 - 35.4 g/dL   RDW, POC 14.9 %   Platelet Count, POC 208 142 - 424 K/uL   MPV 8.1 0 - 99.8 fL

## 2014-12-20 ENCOUNTER — Encounter: Payer: Self-pay | Admitting: Family Medicine

## 2015-02-01 ENCOUNTER — Other Ambulatory Visit: Payer: Self-pay | Admitting: Family Medicine

## 2015-03-07 ENCOUNTER — Ambulatory Visit: Payer: Medicare Other | Admitting: Family Medicine

## 2015-03-09 ENCOUNTER — Encounter: Payer: Self-pay | Admitting: Family Medicine

## 2015-03-09 ENCOUNTER — Ambulatory Visit (INDEPENDENT_AMBULATORY_CARE_PROVIDER_SITE_OTHER): Payer: Medicare Other | Admitting: Family Medicine

## 2015-03-09 VITALS — BP 150/80 | HR 98 | Temp 97.6°F | Resp 16 | Ht 65.0 in | Wt 209.0 lb

## 2015-03-09 DIAGNOSIS — M67432 Ganglion, left wrist: Secondary | ICD-10-CM | POA: Diagnosis not present

## 2015-03-09 DIAGNOSIS — I10 Essential (primary) hypertension: Secondary | ICD-10-CM

## 2015-03-09 MED ORDER — AMLODIPINE BESYLATE 5 MG PO TABS: ORAL_TABLET | ORAL | Status: DC

## 2015-03-09 MED ORDER — LISINOPRIL-HYDROCHLOROTHIAZIDE 20-25 MG PO TABS: 1.0000 | ORAL_TABLET | Freq: Every day | ORAL | Status: DC

## 2015-03-09 MED ORDER — AMLODIPINE BESYLATE 2.5 MG PO TABS: 2.5000 mg | ORAL_TABLET | Freq: Every day | ORAL | Status: DC

## 2015-03-09 NOTE — Patient Instructions (Addendum)
Keep a record of your blood pressures outside of the office and if remaining over 140/90 in the next 2 weeks - take both a 5mg  pill and 2.5mg  pill of the amlodipine (total dose 7.5mg )  Follow up in next 3 months for blood pressure recheck and medicare physical/wellness exam.   Let me know if you change your mind about the flu vaccine and pneumonia vaccines that were recommended today.   Try tylenol over the counter if needed for hand pain, as this is safer for high blood pressure. If any pain in bump, can refer you to hand surgeon to have it drained if you would like.    Return to the clinic or go to the nearest emergency room if any of your symptoms worsen or new symptoms occur.  Ganglion Cyst A ganglion cyst is a noncancerous, fluid-filled lump that occurs near joints or tendons. The ganglion cyst grows out of a joint or the lining of a tendon. It most often develops in the hand or wrist, but it can also develop in the shoulder, elbow, hip, knee, ankle, or foot. The round or oval ganglion cyst can be the size of a pea or larger than a grape. Increased activity may enlarge the size of the cyst because more fluid starts to build up.  CAUSES It is not known what causes a ganglion cyst to grow. However, it may be related to:  Inflammation or irritation around the joint.  An injury.  Repetitive movements or overuse.  Arthritis. RISK FACTORS Risk factors include:  Being a woman.  Being age 29-50. SIGNS AND SYMPTOMS Symptoms may include:   A lump. This most often appears on the hand or wrist, but it can occur in other areas of the body.  Tingling.  Pain.  Numbness.  Muscle weakness.  Weak grip.  Less movement in a joint. DIAGNOSIS Ganglion cysts are most often diagnosed based on a physical exam. Your health care provider will feel the lump and may shine a light alongside it. If it is a ganglion cyst, a light often shines through it. Your health care provider may order an  X-ray, ultrasound, or MRI to rule out other conditions. TREATMENT Ganglion cysts usually go away on their own without treatment. If pain or other symptoms are involved, treatment may be needed. Treatment is also needed if the ganglion cyst limits your movement or if it gets infected. Treatment may include:  Wearing a brace or splint on your wrist or finger.  Taking anti-inflammatory medicine.  Draining fluid from the lump with a needle (aspiration).  Injecting a steroid into the joint.  Surgery to remove the ganglion cyst. HOME CARE INSTRUCTIONS  Do not press on the ganglion cyst, poke it with a needle, or hit it.  Take medicines only as directed by your health care provider.  Wear your brace or splint as directed by your health care provider.  Watch your ganglion cyst for any changes.  Keep all follow-up visits as directed by your health care provider. This is important. SEEK MEDICAL CARE IF:  Your ganglion cyst becomes larger or more painful.  You have increased redness, red streaks, or swelling.  You have pus coming from the lump.  You have weakness or numbness in the affected area.  You have a fever or chills.   This information is not intended to replace advice given to you by your health care provider. Make sure you discuss any questions you have with your health care provider.  Document Released: 01/13/2000 Document Revised: 02/05/2014 Document Reviewed: 06/30/2013 Elsevier Interactive Patient Education [employer].

## 2015-03-09 NOTE — Progress Notes (Signed)
Subjective:    Patient ID: Steven Vargas, male    DOB: 29-Mar-1935, 80 y.o.   MRN: [id number] By signing my name below, I, [name], attest that this documentation has been prepared under the direction and in the presence of [name], MD.  Electronically Signed: [name], Medical Scribe. 03/09/2015. 8:47 AM.  HPI HPI Comments: Steven Vargas is a 80 y.o. male who presents to the [facility] for a follow-up for hypertension. New patient to me. History of HTN. Previous patient of Dr. Hardin Negus. History of a CVA. History of GI bleed approximately 4 years ago. He was continued on lisinopril-HCTZ and added amlodipine 5 mg qd. Normal CBC and normal CMP at November visit with Dr. Brigitte Pulse. Patient has been compliant with both of his blood pressure medications and has not missed any doses recently. He has not been checking his blood pressure at home. He has not acquired a blood pressure cuff yet. He notes he has had white coat hypertension in the past. Patient denies chest pain, lightheadedness, dizziness, and blood in stool.   Patient declines flu shot and pneumonia vaccine today.  Patient also has concerns for a cyst on his left wrist that he first noticed about 2-3 months ago. This does not bother him, but he is concerned it could affect a vein. He has had intermittent pain to his thumb over the past year. He is able to resolve the pain with grip exercises. Patient denies any pain currently.    Patient Active Problem List   Diagnosis Date Noted  . Diverticulosis of colon (without mention of hemorrhage) 04/06/2011  . Benign neoplasm of colon 04/06/2011  . Diverticulosis of colon with hemorrhage 04/06/2011  . Diaphragmatic hernia without mention of obstruction or gangrene 04/06/2011  . Hyperglycemia 04/05/2011  . Syncope and collapse 04/05/2011  . Ankle fracture, lateral malleolus, closed 04/05/2011  . Acute posthemorrhagic anemia 04/05/2011  . Blood in stool 04/05/2011  .  Acute GI bleeding 04/05/2011   Past Medical History  Diagnosis Date  . HLD (hyperlipidemia)   . Gastric ulcer     secondary to nsaid 10 years ago  . Hip pain, chronic     right  . GI bleed 04/04/2011  . Obesity (BMI 35.0-39.9 without comorbidity) ([place]) 03/2011  . Hypertension   . Stroke [facility])     2005   Past Surgical History  Procedure Laterality Date  . Tonsillectomy     Not on File Prior to Admission medications   Medication Sig Start Date End Date Taking? Authorizing Provider  amLODipine (NORVASC) 5 MG tablet TAKE 1 TABLET BY MOUTH DAILY 02/01/15   Shawnee Knapp, MD  lisinopril-hydrochlorothiazide (PRINZIDE,ZESTORETIC) 20-25 MG tablet Take 1 tablet by mouth daily. 12/14/14   Shawnee Knapp, MD  meloxicam (MOBIC) 7.5 MG tablet Take 7.5 mg by mouth daily as needed. For pain    Historical Provider, MD   Social History   Social History  . Marital Status: Married    Spouse Name: [name]  . Number of Children: [name]  . Years of Education: [name]   Occupational History  . Not on file.   Social History Main Topics  . Smoking status: Former Research scientist (life sciences)  . Smokeless tobacco: Never Used  . Alcohol Use: Yes     Comment: occasional  . Drug Use: No  . Sexual Activity: Not Currently   Other Topics Concern  . Not on file   Social History Narrative   Lives in [place],  [place] with his wife and son. 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 Drinks 2-3 beers once every few weeks. Smoking 2 years ago. Used to smoke 2-3 packs per day before that for past many years. Never did illicit drugs. Has United health care Medicare.     Review of Systems  Constitutional: Negative for fatigue and unexpected weight change.  Eyes: Negative for visual disturbance.  Respiratory: Negative for cough, chest tightness and shortness of breath.   Cardiovascular: Negative for chest pain, palpitations and leg swelling.  Gastrointestinal: Negative for abdominal pain and blood in stool.  Musculoskeletal:  Positive for arthralgias.  Neurological: Negative for dizziness, light-headedness and headaches.       Objective:   Physical Exam  Constitutional: He is oriented to person, place, and time. He appears well-developed and well-nourished.  HENT:  Head: Normocephalic and atraumatic.  Eyes: EOM are normal. Pupils are equal, round, and reactive to light.  Neck: No JVD present. Carotid bruit is not present.  Cardiovascular: Normal rate, regular rhythm and normal heart sounds.   No murmur heard. Pulmonary/Chest: Effort normal and breath sounds normal. He has no rales.  Musculoskeletal: He exhibits no edema or tenderness.  Non-tender and full ROM of left wrist.  Neurological: He is alert and oriented to person, place, and time.  Skin: Skin is warm and dry.  Mobile cystic structure on the dorsum of left wrist.  Psychiatric: He has a normal mood and affect.  Vitals reviewed.   Filed Vitals:   03/09/15 0810 03/09/15 0812  BP: 159/85 150/80  Pulse: 98   Temp: 97.6 F (36.4 C)   TempSrc: Oral   Resp: 16   Height: 5\' 5"  (1.651 m)   Weight: 209 lb (94.802 kg)          Assessment & Plan:   [name] is a 80 y.o. male Essential hypertension - Plan: amLODipine (NORVASC) 2.5 MG tablet, amLODipine (NORVASC) 5 MG tablet, lisinopril-hydrochlorothiazide (PRINZIDE,ZESTORETIC) 20-25 MG tablet  -Still slightly elevated, will have him increase total dose of Norvasc to 7.5 mg daily if remaining over 140/90 at home. Additional prescription for 2.5 mg tablets were given to add to his current 5 mg prescription. Continue lisinopril HCTZ at same dose. Follow-up in the next 3 months for recheck of blood pressure and physical/wellness exam.  Ganglion cyst of wrist, left  -Minimal symptoms, nontender on exam. Decided against any intervention today, but advised if this is uncomfortable, aspiration can be attempted either in office or refer to hand specialist. Tylenol as needed for discomfort given  underlying hypertension.  Health maintenance. Both flu vaccine and pneumonia vaccine were declined.  Meds ordered this encounter  Medications  . amLODipine (NORVASC) 2.5 MG tablet    Sig: Take 1 tablet (2.5 mg total) by mouth daily.    Dispense:  90 tablet    Refill:  0  . amLODipine (NORVASC) 5 MG tablet    Sig: TAKE 1 TABLET BY MOUTH DAILY    Dispense:  90 tablet    Refill:  0  . lisinopril-hydrochlorothiazide (PRINZIDE,ZESTORETIC) 20-25 MG tablet    Sig: Take 1 tablet by mouth daily.    Dispense:  90 tablet    Refill:  0   Patient Instructions  Keep a record of your blood pressures outside of the office and if remaining over 140/90 in the next 2 weeks - take both a 5mg  pill and 2.5mg  pill of the amlodipine (total dose 7.5mg )  Follow up  in next 3 months for blood pressure recheck and medicare physical/wellness exam.   Let me know if you change your mind about the flu vaccine and pneumonia vaccines that were recommended today.   Try tylenol over the counter if needed for hand pain, as this is safer for high blood pressure. If any pain in bump, can refer you to hand surgeon to have it drained if you would like.    Return to the clinic or go to the nearest emergency room if any of your symptoms worsen or new symptoms occur.  Ganglion Cyst A ganglion cyst is a noncancerous, fluid-filled lump that occurs near joints or tendons. The ganglion cyst grows out of a joint or the lining of a tendon. It most often develops in the hand or wrist, but it can also develop in the shoulder, elbow, hip, knee, ankle, or foot. The round or oval ganglion cyst can be the size of a pea or larger than a grape. Increased activity may enlarge the size of the cyst because more fluid starts to build up.  CAUSES It is not known what causes a ganglion cyst to grow. However, it may be related to:  Inflammation or irritation around the joint.  An injury.  Repetitive movements or overuse.  Arthritis. RISK  FACTORS Risk factors include:  Being a woman.  Being age 22-50. SIGNS AND SYMPTOMS Symptoms may include:   A lump. This most often appears on the hand or wrist, but it can occur in other areas of the body.  Tingling.  Pain.  Numbness.  Muscle weakness.  Weak grip.  Less movement in a joint. DIAGNOSIS Ganglion cysts are most often diagnosed based on a physical exam. Your health care provider will feel the lump and may shine a light alongside it. If it is a ganglion cyst, a light often shines through it. Your health care provider may order an X-ray, ultrasound, or MRI to rule out other conditions. TREATMENT Ganglion cysts usually go away on their own without treatment. If pain or other symptoms are involved, treatment may be needed. Treatment is also needed if the ganglion cyst limits your movement or if it gets infected. Treatment may include:  Wearing a brace or splint on your wrist or finger.  Taking anti-inflammatory medicine.  Draining fluid from the lump with a needle (aspiration).  Injecting a steroid into the joint.  Surgery to remove the ganglion cyst. HOME CARE INSTRUCTIONS  Do not press on the ganglion cyst, poke it with a needle, or hit it.  Take medicines only as directed by your health care provider.  Wear your brace or splint as directed by your health care provider.  Watch your ganglion cyst for any changes.  Keep all follow-up visits as directed by your health care provider. This is important. SEEK MEDICAL CARE IF:  Your ganglion cyst becomes larger or more painful.  You have increased redness, red streaks, or swelling.  You have pus coming from the lump.  You have weakness or numbness in the affected area.  You have a fever or chills.   This information is not intended to replace advice given to you by your health care provider. Make sure you discuss any questions you have with your health care provider.   Document Released: 01/13/2000  Document Revised: 02/05/2014 Document Reviewed: 06/30/2013 Elsevier Interactive Patient Education [employer].        I personally performed the services described in this documentation, which was scribed in my  presence. The recorded information has been reviewed and considered, and addended by me as needed.

## 2015-05-27 ENCOUNTER — Other Ambulatory Visit: Payer: Self-pay | Admitting: Family Medicine

## 2015-06-01 ENCOUNTER — Ambulatory Visit (INDEPENDENT_AMBULATORY_CARE_PROVIDER_SITE_OTHER): Payer: Medicare Other | Admitting: Family Medicine

## 2015-06-01 ENCOUNTER — Encounter: Payer: Self-pay | Admitting: Family Medicine

## 2015-06-01 VITALS — BP 140/90 | HR 98 | Temp 98.2°F | Resp 16 | Ht 65.0 in | Wt 214.4 lb

## 2015-06-01 DIAGNOSIS — M67432 Ganglion, left wrist: Secondary | ICD-10-CM

## 2015-06-01 DIAGNOSIS — I10 Essential (primary) hypertension: Secondary | ICD-10-CM

## 2015-06-01 DIAGNOSIS — Z13220 Encounter for screening for lipoid disorders: Secondary | ICD-10-CM

## 2015-06-01 DIAGNOSIS — E876 Hypokalemia: Secondary | ICD-10-CM

## 2015-06-01 LAB — LIPID PANEL
CHOL/HDL RATIO: 3.2 ratio (ref <=5.0)
Cholesterol: 212 mg/dL — ABNORMAL HIGH (ref 125–200)
HDL: 66 mg/dL (ref >=40)
LDL Cholesterol: 117 mg/dL (ref <130)
Triglycerides: 146 mg/dL (ref <150)
VLDL: 29 mg/dL (ref <30)

## 2015-06-01 LAB — COMPLETE METABOLIC PANEL WITH GFR
ALBUMIN: 4.2 g/dL (ref 3.6–5.1)
ALK PHOS: 54 U/L (ref 40–115)
ALT: 16 U/L (ref 9–46)
AST: 14 U/L (ref 10–35)
BUN: 21 mg/dL (ref 7–25)
CO2: 28 mmol/L (ref 20–31)
Calcium: 9.2 mg/dL (ref 8.6–10.3)
Chloride: 100 mmol/L (ref 98–110)
Creat: 1.03 mg/dL (ref 0.70–1.18)
GFR, EST NON AFRICAN AMERICAN: 70 mL/min (ref >=60)
GFR, Est African American: 81 mL/min (ref >=60)
GLUCOSE: 97 mg/dL (ref 65–99)
POTASSIUM: 3.3 mmol/L — AB (ref 3.5–5.3)
SODIUM: 139 mmol/L (ref 135–146)
Total Bilirubin: 0.6 mg/dL (ref 0.2–1.2)
Total Protein: 7 g/dL (ref 6.1–8.1)

## 2015-06-01 MED ORDER — LISINOPRIL-HYDROCHLOROTHIAZIDE 20-25 MG PO TABS: 1.0000 | ORAL_TABLET | Freq: Every day | ORAL | Status: DC

## 2015-06-01 MED ORDER — AMLODIPINE BESYLATE 2.5 MG PO TABS: 2.5000 mg | ORAL_TABLET | Freq: Every day | ORAL | Status: DC

## 2015-06-01 MED ORDER — AMLODIPINE BESYLATE 5 MG PO TABS: 5.0000 mg | ORAL_TABLET | Freq: Every day | ORAL | Status: DC

## 2015-06-01 NOTE — Progress Notes (Signed)
By signing my name below, I, Mesha Guinyard, attest that this documentation has been prepared under the direction and in the presence of [name], MD.  Electronically Signed: Verlee Monte, Medical Scribe. 06/01/2015. 11:25 AM.  Subjective:    Patient ID: Steven Vargas, male    DOB: 02-07-1935, 80 y.o.   MRN: [id number]  HPI Chief Complaint  Patient presents with  . Follow-up  . Hypertension  . Medication Refill    Amlodipine 2.5 mg   HPI Comments: Steven Vargas is a 80 y.o. male who presents to the [facility] for a follow-up on his HTN. Pt denies any side effects with his medications. Pt denies low bp since last visit.   HTN: Has a hx of CVA. He takes lisinopril HCTZ and norvasc. Did report hx of white coat HTN that last visit in Feb with me with his BP reading at 150/80 that time. Continued on same medications but advised to inc to 7.5 mg norvasc if persistent elevated home readings.  Lab Results  Component Value Date   CREATININE 1.12 12/14/2014   Patient Active Problem List   Diagnosis Date Noted  . Diverticulosis of colon (without mention of hemorrhage) 04/06/2011  . Benign neoplasm of colon 04/06/2011  . Diverticulosis of colon with hemorrhage 04/06/2011  . Diaphragmatic hernia without mention of obstruction or gangrene 04/06/2011  . Hyperglycemia 04/05/2011  . Syncope and collapse 04/05/2011  . Ankle fracture, lateral malleolus, closed 04/05/2011  . Acute posthemorrhagic anemia 04/05/2011  . Blood in stool 04/05/2011  . Acute GI bleeding 04/05/2011   Past Medical History  Diagnosis Date  . HLD (hyperlipidemia)   . Gastric ulcer     secondary to nsaid 10 years ago  . Hip pain, chronic     right  . GI bleed 04/04/2011  . Obesity (BMI 35.0-39.9 without comorbidity) ([place]) 03/2011  . Hypertension   . Stroke [facility])     2005   Past Surgical History  Procedure Laterality Date  . Tonsillectomy     No Known Allergies Prior to Admission  medications   Medication Sig Start Date End Date Taking? Authorizing Provider  amLODipine (NORVASC) 2.5 MG tablet Take 1 tablet by mouth  daily 05/28/15  Yes Wendie Agreste, MD  lisinopril-hydrochlorothiazide (PRINZIDE,ZESTORETIC) 20-25 MG tablet Take 1 tablet by mouth daily. 03/09/15  Yes Wendie Agreste, MD   Social History   Social History  . Marital Status: Married    Spouse Name: [name]  . Number of Children: [name]  . Years of Education: [name]   Occupational History  . Not on file.   Social History Main Topics  . Smoking status: Former Research scientist (life sciences)  . Smokeless tobacco: Never Used  . Alcohol Use: Yes     Comment: occasional  . Drug Use: No  . Sexual Activity: Not Currently   Other Topics Concern  . Not on file   Social History Narrative   Lives in [place], [place] with his wife and son. 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 Drinks 2-3 beers once every few weeks. Smoking 2 years ago. Used to smoke 2-3 packs per day before that for past many years. Never did illicit drugs. Has United health care Medicare.   Review of Systems  Constitutional: Negative for fatigue and unexpected weight change.  Eyes: Negative for visual disturbance.  Respiratory: Negative for cough, chest tightness and shortness of breath.   Cardiovascular: Negative for chest pain, palpitations and leg  swelling.  Gastrointestinal: Negative for abdominal pain and blood in stool.  Neurological: Negative for dizziness, light-headedness and headaches.   Objective:   Physical Exam  Constitutional: He is oriented to person, place, and time. He appears well-developed and well-nourished.  HENT:  Head: Normocephalic and atraumatic.  Eyes: EOM are normal. Pupils are equal, round, and reactive to light.  Neck: No JVD present. Carotid bruit is not present.  Cardiovascular: Normal rate, regular rhythm and normal heart sounds.   No murmur heard. Pulmonary/Chest: Effort normal and breath sounds normal. He  has no rales.  Abdominal:  .  Musculoskeletal: He exhibits no edema.  1 cm mobile, non-tender ganglion cyst on dorsal side of left hand. No erythema.   Neurological: He is alert and oriented to person, place, and time.  Skin: Skin is warm and dry.  Psychiatric: He has a normal mood and affect.  Vitals reviewed.  Filed Vitals:   06/01/15 1049 06/01/15 1056  BP: 124/90 140/90  Pulse: 98   Temp: 98.2 F (36.8 C)   TempSrc: Oral   Resp: 16   Height: 5\' 5"  (1.651 m)   Weight: 214 lb 6.4 oz (97.251 kg)   SpO2: 97%      Assessment & Plan:   Steven Vargas is a 80 y.o. male Essential hypertension - Plan: COMPLETE METABOLIC PANEL WITH GFR, lisinopril-hydrochlorothiazide (PRINZIDE,ZESTORETIC) 20-25 MG tablet  - stable. Continue Same dose of Zestoretic, and amlodipine at 7.5 mg total dose per day.  Screening for hyperlipidemia - Plan: COMPLETE METABOLIC PANEL WITH GFR, Lipid panel  Ganglion cyst of wrist, left  -Asymptomatic, but would like to have this excised/drained.  Will return for aspiration in next few weeks.   Meds ordered this encounter  Medications  . lisinopril-hydrochlorothiazide (PRINZIDE,ZESTORETIC) 20-25 MG tablet    Sig: Take 1 tablet by mouth daily.    Dispense:  90 tablet    Refill:  1  . amLODipine (NORVASC) 5 MG tablet    Sig: Take 1 tablet (5 mg total) by mouth daily.    Dispense:  90 tablet    Refill:  1  . amLODipine (NORVASC) 2.5 MG tablet    Sig: Take 1 tablet (2.5 mg total) by mouth daily.    Dispense:  90 tablet    Refill:  1   Patient Instructions       IF you received an x-ray today, you will receive an invoice from [facility]. Please contact [facility] at [phone] with questions or concerns regarding your invoice.   IF you received labwork today, you will receive an invoice from [employer]. Please contact [facility] at [phone] with questions or concerns regarding your invoice.   Our  billing staff will not be able to assist you with questions regarding bills from these companies.  You will be contacted with the lab results as soon as they are available. The fastest way to get your results is to activate your My Chart account. Instructions are located on the last page of this paperwork. If you have not heard from [place] regarding the results in 2 weeks, please contact this office.    Continue  the 7.5mg  total dose of amlodipine for now. If you have lightheadedness or dizziness on that dose - return to check doses. Continue lisinopril/hydrochlorothiazide at same dose.   If you would like to have the cyst removed/fluid withdrawn - schedule visit as soon as you would like or can see me at walk in center at your  convenience.   Return to the clinic or go to the nearest emergency room if any of your symptoms worsen or new symptoms occur.  Ganglion Cyst A ganglion cyst is a noncancerous, fluid-filled lump that occurs near joints or tendons. The ganglion cyst grows out of a joint or the lining of a tendon. It most often develops in the hand or wrist, but it can also develop in the shoulder, elbow, hip, knee, ankle, or foot. The round or oval ganglion cyst can be the size of a pea or larger than a grape. Increased activity may enlarge the size of the cyst because more fluid starts to build up.  CAUSES It is not known what causes a ganglion cyst to grow. However, it may be related to:  Inflammation or irritation around the joint.  An injury.  Repetitive movements or overuse.  Arthritis. RISK FACTORS Risk factors include:  Being a woman.  Being age 32-50. SIGNS AND SYMPTOMS Symptoms may include:   A lump. This most often appears on the hand or wrist, but it can occur in other areas of the body.  Tingling.  Pain.  Numbness.  Muscle weakness.  Weak grip.  Less movement in a joint. DIAGNOSIS Ganglion cysts are most often diagnosed based on a physical exam. Your health  care provider will feel the lump and may shine a light alongside it. If it is a ganglion cyst, a light often shines through it. Your health care provider may order an X-ray, ultrasound, or MRI to rule out other conditions. TREATMENT Ganglion cysts usually go away on their own without treatment. If pain or other symptoms are involved, treatment may be needed. Treatment is also needed if the ganglion cyst limits your movement or if it gets infected. Treatment may include:  Wearing a brace or splint on your wrist or finger.  Taking anti-inflammatory medicine.  Draining fluid from the lump with a needle (aspiration).  Injecting a steroid into the joint.  Surgery to remove the ganglion cyst. HOME CARE INSTRUCTIONS  Do not press on the ganglion cyst, poke it with a needle, or hit it.  Take medicines only as directed by your health care provider.  Wear your brace or splint as directed by your health care provider.  Watch your ganglion cyst for any changes.  Keep all follow-up visits as directed by your health care provider. This is important. SEEK MEDICAL CARE IF:  Your ganglion cyst becomes larger or more painful.  You have increased redness, red streaks, or swelling.  You have pus coming from the lump.  You have weakness or numbness in the affected area.  You have a fever or chills.   This information is not intended to replace advice given to you by your health care provider. Make sure you discuss any questions you have with your health care provider.   Document Released: 01/13/2000 Document Revised: 02/05/2014 Document Reviewed: 06/30/2013 Elsevier Interactive Patient Education [employer].     I personally performed the services described in this documentation, which was scribed in my presence. The recorded information has been reviewed and considered, and addended by me as needed.

## 2015-06-01 NOTE — Patient Instructions (Addendum)
IF you received an x-ray today, you will receive an invoice from [facility]. Please contact [facility] at [phone] with questions or concerns regarding your invoice.   IF you received labwork today, you will receive an invoice from [employer]. Please contact [facility] at [phone] with questions or concerns regarding your invoice.   Our billing staff will not be able to assist you with questions regarding bills from these companies.  You will be contacted with the lab results as soon as they are available. The fastest way to get your results is to activate your My Chart account. Instructions are located on the last page of this paperwork. If you have not heard from [place] regarding the results in 2 weeks, please contact this office.    Continue  the 7.5mg  total dose of amlodipine for now. If you have lightheadedness or dizziness on that dose - return to check doses. Continue lisinopril/hydrochlorothiazide at same dose.   If you would like to have the cyst removed/fluid withdrawn - schedule visit as soon as you would like or can see me at walk in center at your convenience.   Return to the clinic or go to the nearest emergency room if any of your symptoms worsen or new symptoms occur.  Ganglion Cyst A ganglion cyst is a noncancerous, fluid-filled lump that occurs near joints or tendons. The ganglion cyst grows out of a joint or the lining of a tendon. It most often develops in the hand or wrist, but it can also develop in the shoulder, elbow, hip, knee, ankle, or foot. The round or oval ganglion cyst can be the size of a pea or larger than a grape. Increased activity may enlarge the size of the cyst because more fluid starts to build up.  CAUSES It is not known what causes a ganglion cyst to grow. However, it may be related to:  Inflammation or irritation around the joint.  An injury.  Repetitive movements or overuse.  Arthritis. RISK  FACTORS Risk factors include:  Being a woman.  Being age 37-50. SIGNS AND SYMPTOMS Symptoms may include:   A lump. This most often appears on the hand or wrist, but it can occur in other areas of the body.  Tingling.  Pain.  Numbness.  Muscle weakness.  Weak grip.  Less movement in a joint. DIAGNOSIS Ganglion cysts are most often diagnosed based on a physical exam. Your health care provider will feel the lump and may shine a light alongside it. If it is a ganglion cyst, a light often shines through it. Your health care provider may order an X-ray, ultrasound, or MRI to rule out other conditions. TREATMENT Ganglion cysts usually go away on their own without treatment. If pain or other symptoms are involved, treatment may be needed. Treatment is also needed if the ganglion cyst limits your movement or if it gets infected. Treatment may include:  Wearing a brace or splint on your wrist or finger.  Taking anti-inflammatory medicine.  Draining fluid from the lump with a needle (aspiration).  Injecting a steroid into the joint.  Surgery to remove the ganglion cyst. HOME CARE INSTRUCTIONS  Do not press on the ganglion cyst, poke it with a needle, or hit it.  Take medicines only as directed by your health care provider.  Wear your brace or splint as directed by your health care provider.  Watch your ganglion cyst for any changes.  Keep all follow-up visits as directed by  your health care provider. This is important. SEEK MEDICAL CARE IF:  Your ganglion cyst becomes larger or more painful.  You have increased redness, red streaks, or swelling.  You have pus coming from the lump.  You have weakness or numbness in the affected area.  You have a fever or chills.   This information is not intended to replace advice given to you by your health care provider. Make sure you discuss any questions you have with your health care provider.   Document Released: 01/13/2000  Document Revised: 02/05/2014 Document Reviewed: 06/30/2013 Elsevier Interactive Patient Education [employer].

## 2015-06-08 ENCOUNTER — Ambulatory Visit: Payer: Medicare Other | Admitting: Family Medicine

## 2015-06-16 ENCOUNTER — Telehealth: Payer: Self-pay

## 2015-06-16 NOTE — Addendum Note (Signed)
Addended by: [name] R on: 06/16/2015 08:29 AM   Modules accepted: Orders

## 2015-06-16 NOTE — Telephone Encounter (Signed)
Spoke to patient, relayed message per Dr. Carlota Raspberry. Pt knows to come back in for labs in 10-14 days

## 2015-06-16 NOTE — Telephone Encounter (Deleted)
Spoke to patient, relayed message per Dr. Carlota Raspberry. Pt knows to come back in for labs in 10-14 days

## 2015-06-16 NOTE — Telephone Encounter (Signed)
-----   Message from Wendie Agreste, MD sent at 06/16/2015  8:28 AM EDT ----- Call patient.  Electrolytes overall in normal range, except potassium slightly low at 3.3. Cholesterol borderline elevated.  Low potassium maay be related to one of his blood pressure medicines.  Eat potassium rich food such as banana each day and recheck levels in next 10-14 days at lab only visit. Let me know if there are any questions.

## 2015-06-30 ENCOUNTER — Other Ambulatory Visit (INDEPENDENT_AMBULATORY_CARE_PROVIDER_SITE_OTHER): Payer: Medicare Other | Admitting: *Deleted

## 2015-06-30 DIAGNOSIS — E876 Hypokalemia: Secondary | ICD-10-CM | POA: Diagnosis not present

## 2015-06-30 LAB — BASIC METABOLIC PANEL
BUN: 22 mg/dL (ref 7–25)
CHLORIDE: 100 mmol/L (ref 98–110)
CO2: 28 mmol/L (ref 20–31)
CREATININE: 1.16 mg/dL (ref 0.70–1.18)
Calcium: 9.1 mg/dL (ref 8.6–10.3)
Glucose, Bld: 102 mg/dL — ABNORMAL HIGH (ref 65–99)
Potassium: 3.6 mmol/L (ref 3.5–5.3)
SODIUM: 141 mmol/L (ref 135–146)

## 2015-07-28 ENCOUNTER — Ambulatory Visit (INDEPENDENT_AMBULATORY_CARE_PROVIDER_SITE_OTHER): Payer: Medicare Other | Admitting: Family Medicine

## 2015-07-28 ENCOUNTER — Encounter: Payer: Self-pay | Admitting: Family Medicine

## 2015-07-28 VITALS — Resp 16 | Ht 65.5 in | Wt 220.0 lb

## 2015-07-28 DIAGNOSIS — M67432 Ganglion, left wrist: Secondary | ICD-10-CM | POA: Diagnosis not present

## 2015-07-28 DIAGNOSIS — I10 Essential (primary) hypertension: Secondary | ICD-10-CM | POA: Diagnosis not present

## 2015-07-28 NOTE — Patient Instructions (Addendum)
IF you received an x-ray today, you will receive an invoice from [facility]. Please contact [facility] at [phone] with questions or concerns regarding your invoice.   IF you received labwork today, you will receive an invoice from [employer]. Please contact [facility] at [phone] with questions or concerns regarding your invoice.   Our billing staff will not be able to assist you with questions regarding bills from these companies.  You will be contacted with the lab results as soon as they are available. The fastest way to get your results is to activate your My Chart account. Instructions are located on the last page of this paperwork. If you have not heard from [place] regarding the results in 2 weeks, please contact this office.    As the cyst is improved - no treatment needed at this point. If the swelling increases again, we can try to drain the fluid as discussed.   No change in meds for now - recheck in 6 months for your blood pressure.   Ganglion Cyst A ganglion cyst is a noncancerous, fluid-filled lump that occurs near joints or tendons. The ganglion cyst grows out of a joint or the lining of a tendon. It most often develops in the hand or wrist, but it can also develop in the shoulder, elbow, hip, knee, ankle, or foot. The round or oval ganglion cyst can be the size of a pea or larger than a grape. Increased activity may enlarge the size of the cyst because more fluid starts to build up.  CAUSES It is not known what causes a ganglion cyst to grow. However, it may be related to:  Inflammation or irritation around the joint.  An injury.  Repetitive movements or overuse.  Arthritis. RISK FACTORS Risk factors include:  Being a woman.  Being age 78-50. SIGNS AND SYMPTOMS Symptoms may include:   A lump. This most often appears on the hand or wrist, but it can occur in other areas of the  body.  Tingling.  Pain.  Numbness.  Muscle weakness.  Weak grip.  Less movement in a joint. DIAGNOSIS Ganglion cysts are most often diagnosed based on a physical exam. Your health care provider will feel the lump and may shine a light alongside it. If it is a ganglion cyst, a light often shines through it. Your health care provider may order an X-ray, ultrasound, or MRI to rule out other conditions. TREATMENT Ganglion cysts usually go away on their own without treatment. If pain or other symptoms are involved, treatment may be needed. Treatment is also needed if the ganglion cyst limits your movement or if it gets infected. Treatment may include:  Wearing a brace or splint on your wrist or finger.  Taking anti-inflammatory medicine.  Draining fluid from the lump with a needle (aspiration).  Injecting a steroid into the joint.  Surgery to remove the ganglion cyst. HOME CARE INSTRUCTIONS  Do not press on the ganglion cyst, poke it with a needle, or hit it.  Take medicines only as directed by your health care provider.  Wear your brace or splint as directed by your health care provider.  Watch your ganglion cyst for any changes.  Keep all follow-up visits as directed by your health care provider. This is important. SEEK MEDICAL CARE IF:  Your ganglion cyst becomes larger or more painful.  You have increased redness, red streaks, or swelling.  You have pus coming from the lump.  You have  weakness or numbness in the affected area.  You have a fever or chills.   This information is not intended to replace advice given to you by your health care provider. Make sure you discuss any questions you have with your health care provider.   Document Released: 01/13/2000 Document Revised: 02/05/2014 Document Reviewed: 06/30/2013 Elsevier Interactive Patient Education [employer].

## 2015-07-28 NOTE — Progress Notes (Signed)
Subjective:  By signing my name below, I, Moises Blood, attest that this documentation has been prepared under the direction and in the presence of [name], MD. Electronically Signed: Moises Blood, [place]. 07/28/2015 , 10:52 AM .  Patient was seen in Room 25 .   Patient ID: Steven Vargas, male    DOB: 1935/03/08, 80 y.o.   MRN: [id number] Chief Complaint  Patient presents with  . Cyst    removal LEFT hand   HPI Steven Vargas is a 80 y.o. male He has a ganglion cyst of his left wrist. Noted at May 3rd office visit. Options discussed last visit, but would like this removed or aspirated.   He reports the cyst has improved. He denies any pain in the area.   Hypokalemia His initial potassium was 3.3 on May 3rd. Repeated testing after eating potassium-rich foods. Potassium was at 3.6 on June 1st.   He reports eating more potassium in his diet (bananas, spinach, etc).   HTN His BP was stable at last visit. He is on increased dose of amlodipine.   Patient Active Problem List   Diagnosis Date Noted  . Diverticulosis of colon (without mention of hemorrhage) 04/06/2011  . Benign neoplasm of colon 04/06/2011  . Diverticulosis of colon with hemorrhage 04/06/2011  . Diaphragmatic hernia without mention of obstruction or gangrene 04/06/2011  . Hyperglycemia 04/05/2011  . Syncope and collapse 04/05/2011  . Ankle fracture, lateral malleolus, closed 04/05/2011  . Acute posthemorrhagic anemia 04/05/2011  . Blood in stool 04/05/2011  . Acute GI bleeding 04/05/2011   Past Medical History  Diagnosis Date  . HLD (hyperlipidemia)   . Gastric ulcer     secondary to nsaid 10 years ago  . Hip pain, chronic     right  . GI bleed 04/04/2011  . Obesity (BMI 35.0-39.9 without comorbidity) ([place]) 03/2011  . Hypertension   . Stroke [facility])     2005   Past Surgical History  Procedure Laterality Date  . Tonsillectomy     No Known Allergies Prior to Admission medications   Medication Sig  Start Date End Date Taking? Authorizing Provider  amLODipine (NORVASC) 2.5 MG tablet Take 1 tablet (2.5 mg total) by mouth daily. 06/01/15   Wendie Agreste, MD  amLODipine (NORVASC) 5 MG tablet Take 1 tablet (5 mg total) by mouth daily. 06/01/15   Wendie Agreste, MD  lisinopril-hydrochlorothiazide (PRINZIDE,ZESTORETIC) 20-25 MG tablet Take 1 tablet by mouth daily. 06/01/15   Wendie Agreste, MD   Social History   Social History  . Marital Status: Married    Spouse Name: [name]  . Number of Children: [name]  . Years of Education: [name]   Occupational History  . Not on file.   Social History Main Topics  . Smoking status: Former Research scientist (life sciences)  . Smokeless tobacco: Never Used  . Alcohol Use: Yes     Comment: occasional  . Drug Use: No  . Sexual Activity: Not Currently   Other Topics Concern  . Not on file   Social History Narrative   Lives in [place], [place] with his wife and son. 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 Drinks 2-3 beers once every few weeks. Smoking 2 years ago. Used to smoke 2-3 packs per day before that for past many years. Never did illicit drugs. Has United health care Medicare.   Review of Systems  Constitutional: Negative for fatigue and unexpected weight change.  Eyes: Negative for visual disturbance.  Respiratory: Negative for cough, chest tightness and shortness of breath.   Cardiovascular: Negative for chest pain, palpitations and leg swelling.  Gastrointestinal: Negative for abdominal pain and blood in stool.  Neurological: Negative for dizziness, light-headedness and headaches.       Objective:   Physical Exam  Constitutional: He is oriented to person, place, and time. He appears well-developed and well-nourished.  HENT:  Head: Normocephalic and atraumatic.  Eyes: EOM are normal. Pupils are equal, round, and reactive to light.  Neck: No JVD present. Carotid bruit is not present.  Cardiovascular: Normal rate, regular rhythm and normal  heart sounds.   No murmur heard. Pulmonary/Chest: Effort normal and breath sounds normal. He has no rales.  Musculoskeletal: He exhibits no edema.  Neurological: He is alert and oriented to person, place, and time.  Skin: Skin is warm and dry.  Very small cystic structure over the dorsum of left wrist  Psychiatric: He has a normal mood and affect.  Vitals reviewed.   Filed Vitals:   07/28/15 0950  Resp: 16  Height: 5' 5.5" (1.664 m)  Weight: 220 lb (99.791 kg)  SpO2: 96%      Assessment & Plan:   Steven Vargas is a 80 y.o. male Essential hypertension  - improved last visit. Tolerating current meds and hypokalemia previously resolved.   Ganglion cyst of wrist, left  - now improved. Decided against any intervention at this point. If increases in size - return for possible aspiration.   No orders of the defined types were placed in this encounter.   Patient Instructions       IF you received an x-ray today, you will receive an invoice from [facility]. Please contact [facility] at [phone] with questions or concerns regarding your invoice.   IF you received labwork today, you will receive an invoice from [employer]. Please contact [facility] at [phone] with questions or concerns regarding your invoice.   Our billing staff will not be able to assist you with questions regarding bills from these companies.  You will be contacted with the lab results as soon as they are available. The fastest way to get your results is to activate your My Chart account. Instructions are located on the last page of this paperwork. If you have not heard from [place] regarding the results in 2 weeks, please contact this office.    As the cyst is improved - no treatment needed at this point. If the swelling increases again, we can try to drain the fluid as discussed.   No change in meds for now - recheck in 6 months for your blood pressure.    Ganglion Cyst A ganglion cyst is a noncancerous, fluid-filled lump that occurs near joints or tendons. The ganglion cyst grows out of a joint or the lining of a tendon. It most often develops in the hand or wrist, but it can also develop in the shoulder, elbow, hip, knee, ankle, or foot. The round or oval ganglion cyst can be the size of a pea or larger than a grape. Increased activity may enlarge the size of the cyst because more fluid starts to build up.  CAUSES It is not known what causes a ganglion cyst to grow. However, it may be related to:  Inflammation or irritation around the joint.  An injury.  Repetitive movements or overuse.  Arthritis. RISK FACTORS Risk factors include:  Being a woman.  Being age 90-50. SIGNS AND SYMPTOMS Symptoms may include:  A lump. This most often appears on the hand or wrist, but it can occur in other areas of the body.  Tingling.  Pain.  Numbness.  Muscle weakness.  Weak grip.  Less movement in a joint. DIAGNOSIS Ganglion cysts are most often diagnosed based on a physical exam. Your health care provider will feel the lump and may shine a light alongside it. If it is a ganglion cyst, a light often shines through it. Your health care provider may order an X-ray, ultrasound, or MRI to rule out other conditions. TREATMENT Ganglion cysts usually go away on their own without treatment. If pain or other symptoms are involved, treatment may be needed. Treatment is also needed if the ganglion cyst limits your movement or if it gets infected. Treatment may include:  Wearing a brace or splint on your wrist or finger.  Taking anti-inflammatory medicine.  Draining fluid from the lump with a needle (aspiration).  Injecting a steroid into the joint.  Surgery to remove the ganglion cyst. HOME CARE INSTRUCTIONS  Do not press on the ganglion cyst, poke it with a needle, or hit it.  Take medicines only as directed by your health care  provider.  Wear your brace or splint as directed by your health care provider.  Watch your ganglion cyst for any changes.  Keep all follow-up visits as directed by your health care provider. This is important. SEEK MEDICAL CARE IF:  Your ganglion cyst becomes larger or more painful.  You have increased redness, red streaks, or swelling.  You have pus coming from the lump.  You have weakness or numbness in the affected area.  You have a fever or chills.   This information is not intended to replace advice given to you by your health care provider. Make sure you discuss any questions you have with your health care provider.   Document Released: 01/13/2000 Document Revised: 02/05/2014 Document Reviewed: 06/30/2013 Elsevier Interactive Patient Education [employer].        I personally performed the services described in this documentation, which was scribed in my presence. The recorded information has been reviewed and considered, and addended by me as needed.   Signed,   [name], MD [facility].  07/28/2015 10:56 AM

## 2016-01-12 ENCOUNTER — Ambulatory Visit (INDEPENDENT_AMBULATORY_CARE_PROVIDER_SITE_OTHER): Payer: Medicare Other | Admitting: Family Medicine

## 2016-01-12 ENCOUNTER — Encounter: Payer: Self-pay | Admitting: Family Medicine

## 2016-01-12 VITALS — BP 120/88 | HR 109 | Temp 98.0°F | Resp 16 | Ht 65.25 in | Wt 218.8 lb

## 2016-01-12 DIAGNOSIS — R000 Tachycardia, unspecified: Secondary | ICD-10-CM | POA: Diagnosis not present

## 2016-01-12 DIAGNOSIS — I10 Essential (primary) hypertension: Secondary | ICD-10-CM | POA: Diagnosis not present

## 2016-01-12 DIAGNOSIS — E876 Hypokalemia: Secondary | ICD-10-CM

## 2016-01-12 MED ORDER — AMLODIPINE BESYLATE 2.5 MG PO TABS: 2.5000 mg | ORAL_TABLET | Freq: Every day | ORAL | 1 refills | Status: DC

## 2016-01-12 MED ORDER — LISINOPRIL-HYDROCHLOROTHIAZIDE 20-25 MG PO TABS: 1.0000 | ORAL_TABLET | Freq: Every day | ORAL | 1 refills | Status: DC

## 2016-01-12 MED ORDER — AMLODIPINE BESYLATE 5 MG PO TABS: 5.0000 mg | ORAL_TABLET | Freq: Every day | ORAL | 1 refills | Status: DC

## 2016-01-12 NOTE — Patient Instructions (Addendum)
  I will check a few tests for fast heart rate, but if you feel heart racing - return to discuss further. EKG ok today.  Return to the clinic or go to the nearest emergency room if any of your symptoms worsen or new symptoms occur.   No change in blood pressure meds today.    IF you received an x-ray today, you will receive an invoice from [facility]. Please contact [facility] at [phone] with questions or concerns regarding your invoice.   IF you received labwork today, you will receive an invoice from [place]. Please contact [facility] at [phone] with questions or concerns regarding your invoice.   Our billing staff will not be able to assist you with questions regarding bills from these companies.  You will be contacted with the lab results as soon as they are available. The fastest way to get your results is to activate your My Chart account. Instructions are located on the last page of this paperwork. If you have not heard from [place] regarding the results in 2 weeks, please contact this office.

## 2016-01-12 NOTE — Progress Notes (Signed)
x

## 2016-01-12 NOTE — Progress Notes (Signed)
By signing my name below I, Tereasa Coop, attest that this documentation has been prepared under the direction and in the presence of Wendie Agreste, MD. Electonically Signed. Tereasa Coop, Scribe 01/12/2016 at 12:00 PM  Subjective:    Patient ID: Steven Vargas, male    DOB: 12-19-1935, 80 y.o.   MRN: [id number]  Chief Complaint  Patient presents with  . Follow-up    6 MONTH - BLOOD PRESSURE    HPI Steven Vargas is a 80 y.o. male who presents to the [facility] for 6 month follow up reevaluation of HTN. Pt also has a history of HLD. Pt was last seen 07/28/15.  HTN This year pt was initially on 2.5mg  amlodipine, then increased to 5mg , then increased again to 7.5mg . Pt also taking lisinopril-HCTZ. At time of last visit on 07/28/15, pt's BP was 140/90 at last visit and was tolerating increased dose of amlodipine well. Pt denies checking his BP. Denies dizziness, lightheadedness, SOB, CP, palpitations, or Chest tightness. States he has one brief episode a day where his HR feels mildly rapid. Notices it when he sits or moves into a certain position. Denies any diaphoresis, nausea, weakness, or feeling bad in general when having episode of tachycardia.  Hypokalemia  Pt's potassium was 3.3 on 06/01/15, then was 3.6 on 06/30/15. Normalized on June 12th. Recommended potassium rich diet.   History of GI bleed with anemia in 2013 Hemoglobin normal last year. Today, pt denies dark tarry stool, blood in stool, vomiting, or abd pain.   States that he ate breakfast with coffee 4 hrs ago.    Patient Active Problem List   Diagnosis Date Noted  . Diverticulosis of colon (without mention of hemorrhage) 04/06/2011  . Benign neoplasm of colon 04/06/2011  . Diverticulosis of colon with hemorrhage 04/06/2011  . Diaphragmatic hernia without mention of obstruction or gangrene 04/06/2011  . Hyperglycemia 04/05/2011  . Syncope and collapse 04/05/2011  . Ankle fracture, lateral malleolus,  closed 04/05/2011  . Acute posthemorrhagic anemia 04/05/2011  . Blood in stool 04/05/2011  . Acute GI bleeding 04/05/2011   Past Medical History:  Diagnosis Date  . Gastric ulcer    secondary to nsaid 10 years ago  . GI bleed 04/04/2011  . Hip pain, chronic    right  . HLD (hyperlipidemia)   . Hypertension   . Obesity (BMI 35.0-39.9 without comorbidity) 03/2011  . Stroke [facility])    2005   Past Surgical History:  Procedure Laterality Date  . TONSILLECTOMY     No Known Allergies Prior to Admission medications   Medication Sig Start Date End Date Taking? Authorizing Provider  amLODipine (NORVASC) 2.5 MG tablet Take 1 tablet (2.5 mg total) by mouth daily. 06/01/15  Yes Wendie Agreste, MD  amLODipine (NORVASC) 5 MG tablet Take 1 tablet (5 mg total) by mouth daily. 06/01/15  Yes Wendie Agreste, MD  lisinopril-hydrochlorothiazide (PRINZIDE,ZESTORETIC) 20-25 MG tablet Take 1 tablet by mouth daily. 06/01/15  Yes Wendie Agreste, MD   Social History   Social History  . Marital status: Married    Spouse name: [name]  . Number of children: [name]  . Years of education: [name]   Occupational History  . Not on file.   Social History Main Topics  . Smoking status: Former Research scientist (life sciences)  . Smokeless tobacco: Never Used  . Alcohol use Yes     Comment: occasional  . Drug use: No  . Sexual activity: Not Currently  Other Topics Concern  . Not on file   Social History Narrative   Lives in [place], [place] with his wife and son. 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 Drinks 2-3 beers once every few weeks. Smoking 2 years ago. Used to smoke 2-3 packs per day before that for past many years. Never did illicit drugs. Has United health care Medicare.      Review of Systems  Constitutional: Negative for fatigue and unexpected weight change.  Eyes: Negative for visual disturbance.  Respiratory: Negative for cough, chest tightness and shortness of breath.   Cardiovascular: Negative  for chest pain, palpitations and leg swelling.       Positive for tachycardic episodes.   Gastrointestinal: Negative for abdominal pain, blood in stool and vomiting.  Neurological: Negative for dizziness, light-headedness and headaches.       Objective:   Physical Exam  Constitutional: He is oriented to person, place, and time. He appears well-developed and well-nourished.  HENT:  Head: Normocephalic and atraumatic.  Eyes: EOM are normal. Pupils are equal, round, and reactive to light.  Neck: No JVD present. Carotid bruit is not present.  Cardiovascular: Regular rhythm and normal heart sounds.  Tachycardia present.   No murmur heard. Pulmonary/Chest: Effort normal and breath sounds normal. He has no rales.  Musculoskeletal: He exhibits no edema.  Neurological: He is alert and oriented to person, place, and time.  Skin: Skin is warm and dry.  Psychiatric: He has a normal mood and affect.  Vitals reviewed.   Vitals:   01/12/16 1138  BP: 120/88  Pulse: (!) 109  Resp: 16  Temp: 98 F (36.7 C)  TempSrc: Oral  SpO2: 95%  Weight: 218 lb 12.8 oz (99.2 kg)  Height: 5' 5.25" (1.657 m)         Assessment & Plan:   Steven Vargas is a 80 y.o. male Essential hypertension - Plan: Basic metabolic panel, lisinopril-hydrochlorothiazide (PRINZIDE,ZESTORETIC) 20-25 MG tablet, amLODipine (NORVASC) 5 MG tablet, amLODipine (NORVASC) 2.5 MG tablet  -Stable. No med changes for now. RTC precautions if persistent episodes of tachycardia, or other new symptoms. Labs pending.  Hypokalemia - Plan: Basic metabolic panel  -Prior hypokalemia, repeat testing on BMP  Tachycardia - Plan: CBC, TSH, EKG 12-Lead  -Intermittent episodes, slightly tachycardic in office. No concerning findings on EKG. Check TSH, CBC with history of anemia, and RTC if symptoms persist or may need to see cardiology. ER precautions given.  Meds ordered this encounter  Medications  . lisinopril-hydrochlorothiazide  (PRINZIDE,ZESTORETIC) 20-25 MG tablet    Sig: Take 1 tablet by mouth daily.    Dispense:  90 tablet    Refill:  1  . amLODipine (NORVASC) 5 MG tablet    Sig: Take 1 tablet (5 mg total) by mouth daily.    Dispense:  90 tablet    Refill:  1  . amLODipine (NORVASC) 2.5 MG tablet    Sig: Take 1 tablet (2.5 mg total) by mouth daily.    Dispense:  90 tablet    Refill:  1   Patient Instructions    I will check a few tests for fast heart rate, but if you feel heart racing - return to discuss further. EKG ok today.  Return to the clinic or go to the nearest emergency room if any of your symptoms worsen or new symptoms occur.   No change in blood pressure meds today.    IF you received an x-ray today, you  will receive an invoice from [facility]. Please contact [facility] Radiology at [phone] with questions or concerns regarding your invoice.   IF you received labwork today, you will receive an invoice from [place]. Please contact [facility] at [phone] with questions or concerns regarding your invoice.   Our billing staff will not be able to assist you with questions regarding bills from these companies.  You will be contacted with the lab results as soon as they are available. The fastest way to get your results is to activate your My Chart account. Instructions are located on the last page of this paperwork. If you have not heard from [place] regarding the results in 2 weeks, please contact this office.       I personally performed the services described in this documentation, which was scribed in my presence. The recorded information has been reviewed and considered, and addended by me as needed.   Signed,   [name], MD [facility].  01/14/16 6:22 PM

## 2016-01-13 LAB — BASIC METABOLIC PANEL
BUN/Creatinine Ratio: 27 — ABNORMAL HIGH (ref 10–24)
BUN: 30 mg/dL — ABNORMAL HIGH (ref 8–27)
CALCIUM: 9.6 mg/dL (ref 8.6–10.2)
CO2: 28 mmol/L (ref 18–29)
CREATININE: 1.1 mg/dL (ref 0.76–1.27)
Chloride: 100 mmol/L (ref 96–106)
GFR calc Af Amer: 74 mL/min/{1.73_m2} (ref >59)
GFR, EST NON AFRICAN AMERICAN: 64 mL/min/{1.73_m2} (ref >59)
Glucose: 96 mg/dL (ref 65–99)
POTASSIUM: 3.4 mmol/L — AB (ref 3.5–5.2)
SODIUM: 146 mmol/L — AB (ref 134–144)

## 2016-01-13 LAB — CBC
HEMOGLOBIN: 15.6 g/dL (ref 13.0–17.7)
Hematocrit: 46.4 % (ref 37.5–51.0)
MCH: 27.8 pg (ref 26.6–33.0)
MCHC: 33.6 g/dL (ref 31.5–35.7)
MCV: 83 fL (ref 79–97)
PLATELETS: 261 10*3/uL (ref 150–379)
RBC: 5.62 x10E6/uL (ref 4.14–5.80)
RDW: 15 % (ref 12.3–15.4)
WBC: 10.3 10*3/uL (ref 3.4–10.8)

## 2016-01-13 LAB — TSH: TSH: 2.31 u[IU]/mL (ref 0.450–4.500)

## 2016-01-26 NOTE — Addendum Note (Signed)
Addended by: [name] R on: 01/26/2016 11:06 AM   Modules accepted: Orders

## 2016-02-21 ENCOUNTER — Other Ambulatory Visit: Payer: Medicare Other | Admitting: Family Medicine

## 2016-02-21 DIAGNOSIS — E876 Hypokalemia: Secondary | ICD-10-CM

## 2016-02-22 LAB — BASIC METABOLIC PANEL
BUN/Creatinine Ratio: 19 (ref 10–24)
BUN: 22 mg/dL (ref 8–27)
CALCIUM: 9.3 mg/dL (ref 8.6–10.2)
CHLORIDE: 96 mmol/L (ref 96–106)
CO2: 27 mmol/L (ref 18–29)
Creatinine, Ser: 1.14 mg/dL (ref 0.76–1.27)
GFR calc non Af Amer: 61 mL/min/{1.73_m2} (ref >59)
GFR, EST AFRICAN AMERICAN: 71 mL/min/{1.73_m2} (ref >59)
Glucose: 107 mg/dL — ABNORMAL HIGH (ref 65–99)
Potassium: 3.3 mmol/L — ABNORMAL LOW (ref 3.5–5.2)
Sodium: 142 mmol/L (ref 134–144)

## 2016-02-25 ENCOUNTER — Telehealth: Payer: Self-pay | Admitting: Radiology

## 2016-02-25 NOTE — Telephone Encounter (Signed)
-----   Message from Wendie Agreste, MD sent at 02/22/2016 10:31 PM EST ----- Call patient.  Potassium still somewhat low. If he is already eating a potassium-rich diet such as a banana everyday, then may need to start potassium supplement. Please let me know about his diet to determine if I need to send in the supplement.

## 2016-02-25 NOTE — Telephone Encounter (Signed)
Called, recording states person I called unable to receive calls at this time

## 2016-03-02 ENCOUNTER — Encounter: Payer: Self-pay | Admitting: *Deleted

## 2016-03-05 ENCOUNTER — Encounter: Payer: Self-pay | Admitting: Internal Medicine

## 2016-04-12 ENCOUNTER — Encounter: Payer: Self-pay | Admitting: Family Medicine

## 2016-04-12 ENCOUNTER — Ambulatory Visit (INDEPENDENT_AMBULATORY_CARE_PROVIDER_SITE_OTHER): Payer: Medicare Other | Admitting: Family Medicine

## 2016-04-12 VITALS — BP 140/80 | HR 75 | Temp 98.0°F | Resp 16 | Ht 65.0 in | Wt 217.6 lb

## 2016-04-12 DIAGNOSIS — I10 Essential (primary) hypertension: Secondary | ICD-10-CM | POA: Diagnosis not present

## 2016-04-12 DIAGNOSIS — Z13220 Encounter for screening for lipoid disorders: Secondary | ICD-10-CM | POA: Diagnosis not present

## 2016-04-12 DIAGNOSIS — Z23 Encounter for immunization: Secondary | ICD-10-CM | POA: Diagnosis not present

## 2016-04-12 DIAGNOSIS — E876 Hypokalemia: Secondary | ICD-10-CM | POA: Diagnosis not present

## 2016-04-12 DIAGNOSIS — R739 Hyperglycemia, unspecified: Secondary | ICD-10-CM

## 2016-04-12 DIAGNOSIS — Z0000 Encounter for general adult medical examination without abnormal findings: Secondary | ICD-10-CM | POA: Diagnosis not present

## 2016-04-12 MED ORDER — LISINOPRIL-HYDROCHLOROTHIAZIDE 20-25 MG PO TABS: 1.0000 | ORAL_TABLET | Freq: Every day | ORAL | 1 refills | Status: DC

## 2016-04-12 MED ORDER — AMLODIPINE BESYLATE 2.5 MG PO TABS: 2.5000 mg | ORAL_TABLET | Freq: Every day | ORAL | 1 refills | Status: DC

## 2016-04-12 MED ORDER — AMLODIPINE BESYLATE 5 MG PO TABS: 5.0000 mg | ORAL_TABLET | Freq: Every day | ORAL | 1 refills | Status: DC

## 2016-04-12 NOTE — Progress Notes (Signed)
By signing my name below, I, Mesha Guinyard, attest that this documentation has been prepared under the direction and in the presence of [name], MD.  Electronically Signed: Verlee Monte, Medical Scribe. 04/12/16. 11:30 AM.  Subjective:    Patient ID: Steven Vargas, male    DOB: 12/28/1935, 81 y.o.   MRN: [id number]  HPI Chief Complaint  Patient presents with  . Annual Exam    HPI Comments: Rachel Samples is a 81 y.o. male with a PMHx of HTN, and GI bleed who presents to the [facility] for his complete annual physical. Pt is fasting today. Pt quit smoking and drinking 5 years ago.  HTN: Takes norvasc 7.5 mg QD and lisinopril-HCTZ 20/25 mg QD. Pt is compliant with his medication and doesn't check his bp. Denies light-headedness, chest pain, chest tightness, dizziness, SOB, HA or other negative side effects. BP Readings from Last 3 Encounters:  04/12/16 140/80  01/12/16 120/88  06/01/15 140/90   GI Bleed: In 2013  Hypokalemia: Potassium 3.01 Feb 2016. Pt stopped eating bananas and spinach.  Hyperglycemia: Glucose 107 Jan 2018  Cancer Screening: Prostate CA: unknown if he has had prostate CA screening, but refused/declined today.  Colon CA: Colonoscopy March 2013 - 3 polyps removed with self limited diverticula bleed with severe diverticulosis. Repeat in 5 years planned.  Pt deferred colonoscopy for colon CA screening and states he doesn't need it.   Immunizations: Pt has not received his PNA, tetanus, or flu vaccine. Pt agrees to get his PNA vaccine and refuses tdap and flu vaccine.  There is no immunization history on file for this patient.  Vision: Pt is not followed by a ophthalmologist and states he doesn't need it. Denies blurry vision or trouble with glare.  Visual Acuity Screening   Right eye Left eye Both eyes  Without correction: 20/20-1 20/20-1 20/20-1  With correction:      Dentist: Has 1 tooth and he no longer is followed by a dentist;  states he doesn't need it. Pt doesn't have dentures and he chews his food with his tooth. Denies trouble swallowing.  Physical Activity/Exercise: Walks daily without chest pain.  Advance Directives: He does not have a advance directive but paper work was provided.  Fall Screening: Fall Risk  04/12/2016 01/12/2016 07/28/2015 06/01/2015 03/09/2015  Falls in the past year? No No No No No   Functional Status Survey: Is the patient deaf or have difficulty hearing?: No Does the patient have difficulty seeing, even when wearing glasses/contacts?: No Does the patient have difficulty concentrating, remembering, or making decisions?: No Does the patient have difficulty walking or climbing stairs?: No Does the patient have difficulty dressing or bathing?: No Does the patient have difficulty doing errands alone such as visiting a doctor's office or shopping?: No  Depression Screening: Depression screen [facility] 2/9 04/12/2016 01/12/2016 07/28/2015 06/01/2015 03/09/2015  Decreased Interest 0 0 0 0 0  Down, Depressed, Hopeless 0 0 0 0 0  PHQ - 2 Score 0 0 0 0 0    Patient Active Problem List   Diagnosis Date Noted  . Diverticulosis of colon (without mention of hemorrhage) 04/06/2011  . Benign neoplasm of colon 04/06/2011  . Diverticulosis of colon with hemorrhage 04/06/2011  . Diaphragmatic hernia without mention of obstruction or gangrene 04/06/2011  . Hyperglycemia 04/05/2011  . Syncope and collapse 04/05/2011  . Ankle fracture, lateral malleolus, closed 04/05/2011  . Acute posthemorrhagic anemia 04/05/2011  . Blood in stool 04/05/2011  .  Acute GI bleeding 04/05/2011   Past Medical History:  Diagnosis Date  . Gastric ulcer    secondary to nsaid 10 years ago  . GI bleed 04/04/2011  . Hip pain, chronic    right  . HLD (hyperlipidemia)   . Hypertension   . Obesity (BMI 35.0-39.9 without comorbidity) 03/2011  . Stroke [facility])    2005   Past Surgical History:  Procedure Laterality Date  .  TONSILLECTOMY     No Known Allergies Prior to Admission medications   Medication Sig Start Date End Date Taking? Authorizing Provider  amLODipine (NORVASC) 2.5 MG tablet Take 1 tablet (2.5 mg total) by mouth daily. 01/12/16   Wendie Agreste, MD  amLODipine (NORVASC) 5 MG tablet Take 1 tablet (5 mg total) by mouth daily. 01/12/16   Wendie Agreste, MD  lisinopril-hydrochlorothiazide (PRINZIDE,ZESTORETIC) 20-25 MG tablet Take 1 tablet by mouth daily. 01/12/16   Wendie Agreste, MD   Social History   Social History  . Marital status: Married    Spouse name: [name]  . Number of children: [name]  . Years of education: [name]   Occupational History  . Not on file.   Social History Main Topics  . Smoking status: Former Research scientist (life sciences)  . Smokeless tobacco: Never Used  . Alcohol use Yes     Comment: occasional  . Drug use: No  . Sexual activity: Not Currently   Other Topics Concern  . Not on file   Social History Narrative   Lives in [place], [place] with his wife and son. 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 Drinks 2-3 beers once every few weeks. Smoking 2 years ago. Used to smoke 2-3 packs per day before that for past many years. Never did illicit drugs. Has [employer] health care Medicare.   Review of Systems  13 point ROS was negative. Objective:  Physical Exam  Constitutional: He is oriented to person, place, and time. He appears well-developed and well-nourished.  HENT:  Head: Normocephalic and atraumatic.  Right Ear: External ear normal.  Left Ear: External ear normal.  Mouth/Throat: Oropharynx is clear and moist.  Thick cerumen R>L Single tooth lower mouth  Eyes: Conjunctivae and EOM are normal. Pupils are equal, round, and reactive to light.  Neck: Normal range of motion. Neck supple. No thyromegaly present.  Cardiovascular: Normal rate, regular rhythm, normal heart sounds and intact distal pulses.   Pulmonary/Chest: Effort normal and breath sounds normal. No  respiratory distress. He has no wheezes.  Abdominal: Soft. He exhibits no distension. There is no tenderness.  Genitourinary:  Genitourinary Comments: Prostate testing declined  Musculoskeletal: Normal range of motion. He exhibits no edema or tenderness.  Lymphadenopathy:    He has no cervical adenopathy.  Neurological: He is alert and oriented to person, place, and time. He has normal reflexes.  Skin: Skin is warm and dry.  Psychiatric: He has a normal mood and affect. His behavior is normal.  Vitals reviewed.   Vitals:   04/12/16 1024  BP: 140/80  Pulse: 75  Resp: 16  Temp: 98 F (36.7 C)  TempSrc: Oral  SpO2: 92%  Weight: 217 lb 9.6 oz (98.7 kg)  Height: 5\' 5"  (1.651 m)   Body mass index is 36.21 kg/m. Assessment & Plan:   Nashton Belson is a 81 y.o. male Medicare annual wellness visit, subsequent  - anticipatory guidance as below in AVS, screening labs if needed. Health maintenance items as above in HPI discussed/recommended as  applicable.  Potential risks of declining cancer screening were discussed. Additionally declines flu and tetanus vaccines. Did agree to pneumonia vaccination, first of 2 vaccines given today (prevnar).   - no concerning responses on depression, fall, or functional status screening. Any positive responses noted as above. Advanced directives discussed as in CHL.   Hyperglycemia - Plan: Comprehensive metabolic panel, Hemoglobin A1C  -Mild hyperglycemia on previous labs. Check A1c, repeat CMP.  Hypokalemia - Plan: Comprehensive metabolic panel  - Check CMP. Potassium-rich foods in the diet were discussed, especially with use of HCTZ.  Essential hypertension - Plan: amLODipine (NORVASC) 2.5 MG tablet, amLODipine (NORVASC) 5 MG tablet, lisinopril-hydrochlorothiazide (PRINZIDE,ZESTORETIC) 20-25 MG tablet  - Borderline, no changes at this point. Continue Norvasc 7.5 mg daily, lisinopril/HCT 20/25 mg daily. Labs pending.   Screening for hyperlipidemia - Plan:  Lipid panel  Need for prophylactic vaccination against Streptococcus pneumoniae (pneumococcus)  - Prevnar given  Meds ordered this encounter  Medications  . amLODipine (NORVASC) 2.5 MG tablet    Sig: Take 1 tablet (2.5 mg total) by mouth daily.    Dispense:  90 tablet    Refill:  1  . amLODipine (NORVASC) 5 MG tablet    Sig: Take 1 tablet (5 mg total) by mouth daily.    Dispense:  90 tablet    Refill:  1  . lisinopril-hydrochlorothiazide (PRINZIDE,ZESTORETIC) 20-25 MG tablet    Sig: Take 1 tablet by mouth daily.    Dispense:  90 tablet    Refill:  1   Patient Instructions   Let me know if you change your mind about cancer screening or colonoscopy and I can refer you.   1st of 2 pneumonia vaccines given today.  Can get 2nd one next visit in 6 months.   If you change your mind about flu or tetanus vaccines - let me know.   I do recommend visit with an eye care provider for exam and to screen for glaucoma.   Banana or other potassium rich food every day. I will recheck that level and blood sugar today.   Follow up in 6 months for blood pressure.    Keeping you healthy  Get these tests  Blood pressure- Have your blood pressure checked once a year by your healthcare provider.  Normal blood pressure is 120/80  Weight- Have your body mass index (BMI) calculated to screen for obesity.  BMI is a measure of body fat based on height and weight. You can also calculate your own BMI at [email or web address].  Cholesterol- Have your cholesterol checked every year.  Diabetes- Have your blood sugar checked regularly if you have high blood pressure, high cholesterol, have a family history of diabetes or if you are overweight.  Screening for Colon Cancer- Colonoscopy starting at age 19.  Screening may begin sooner depending on your family history and other health conditions. Follow up colonoscopy as directed by your Gastroenterologist.  Screening for Prostate Cancer- Both blood work  (PSA) and a rectal exam help screen for Prostate Cancer.  Screening begins at age 59 with African-American men and at age 35 with Caucasian men.  Screening may begin sooner depending on your family history.  Take these medicines  Aspirin- One aspirin daily can help prevent Heart disease and Stroke.  Flu shot- Every fall.  Tetanus- Every 10 years.  Zostavax- Once after the age of 16 to prevent Shingles.  Pneumonia shot- Once after the age of 59; if you are younger than 29, ask your healthcare  provider if you need a Pneumonia shot.  Take these steps  Don't smoke- If you do smoke, talk to your doctor about quitting.  For tips on how to quit, go to www.smokefree.gov or call 1-800-QUIT-NOW.  Be physically active- Exercise 5 days a week for at least 30 minutes.  If you are not already physically active start slow and gradually work up to 30 minutes of moderate physical activity.  Examples of moderate activity include walking briskly, mowing the yard, dancing, swimming, bicycling, etc.  Eat a healthy diet- Eat a variety of healthy food such as fruits, vegetables, low fat milk, low fat cheese, yogurt, lean meant, poultry, fish, beans, tofu, etc. For more information go to www.thenutritionsource.org  Drink alcohol in moderation- Limit alcohol intake to less than two drinks a day. Never drink and drive.  Dentist- Brush and floss twice daily; visit your dentist twice a year.  Depression- Your emotional health is as important as your physical health. If you're feeling down, or losing interest in things you would normally enjoy please talk to your healthcare provider.  Eye exam- Visit your eye doctor every year.  Safe sex- If you may be exposed to a sexually transmitted infection, use a condom.  Seat belts- Seat belts can save your life; always wear one.  Smoke/Carbon Monoxide detectors- These detectors need to be installed on the appropriate level of your home.  Replace batteries at least once  a year.  Skin cancer- When out in the sun, cover up and use sunscreen 15 SPF or higher.  Violence- If anyone is threatening you, please tell your healthcare provider.  Living Will/ Health care power of attorney- Speak with your healthcare provider and family.   IF you received an x-ray today, you will receive an invoice from [facility]. Please contact [facility] at [phone] with questions or concerns regarding your invoice.   IF you received labwork today, you will receive an invoice from [place]. Please contact [facility] at [phone] with questions or concerns regarding your invoice.   Our billing staff will not be able to assist you with questions regarding bills from these companies.  You will be contacted with the lab results as soon as they are available. The fastest way to get your results is to activate your My Chart account. Instructions are located on the last page of this paperwork. If you have not heard from [place] regarding the results in 2 weeks, please contact this office.       I personally performed the services described in this documentation, which was scribed in my presence. The recorded information has been reviewed and considered for accuracy and completeness, addended by me as needed, and agree with information above.  Signed,   [name], MD [facility].  04/12/16 2:47 PM

## 2016-04-12 NOTE — Patient Instructions (Addendum)
Let me know if you change your mind about cancer screening or colonoscopy and I can refer you.   1st of 2 pneumonia vaccines given today.  Can get 2nd one next visit in 6 months.   If you change your mind about flu or tetanus vaccines - let me know.   I do recommend visit with an eye care provider for exam and to screen for glaucoma.   Banana or other potassium rich food every day. I will recheck that level and blood sugar today.   Follow up in 6 months for blood pressure.    Keeping you healthy  Get these tests  Blood pressure- Have your blood pressure checked once a year by your healthcare provider.  Normal blood pressure is 120/80  Weight- Have your body mass index (BMI) calculated to screen for obesity.  BMI is a measure of body fat based on height and weight. You can also calculate your own BMI at [email or web address].  Cholesterol- Have your cholesterol checked every year.  Diabetes- Have your blood sugar checked regularly if you have high blood pressure, high cholesterol, have a family history of diabetes or if you are overweight.  Screening for Colon Cancer- Colonoscopy starting at age 45.  Screening may begin sooner depending on your family history and other health conditions. Follow up colonoscopy as directed by your Gastroenterologist.  Screening for Prostate Cancer- Both blood work (PSA) and a rectal exam help screen for Prostate Cancer.  Screening begins at age 40 with African-American men and at age 85 with Caucasian men.  Screening may begin sooner depending on your family history.  Take these medicines  Aspirin- One aspirin daily can help prevent Heart disease and Stroke.  Flu shot- Every fall.  Tetanus- Every 10 years.  Zostavax- Once after the age of 54 to prevent Shingles.  Pneumonia shot- Once after the age of 31; if you are younger than 51, ask your healthcare provider if you need a Pneumonia shot.  Take these steps  Don't smoke- If you do smoke,  talk to your doctor about quitting.  For tips on how to quit, go to www.smokefree.gov or call 1-800-QUIT-NOW.  Be physically active- Exercise 5 days a week for at least 30 minutes.  If you are not already physically active start slow and gradually work up to 30 minutes of moderate physical activity.  Examples of moderate activity include walking briskly, mowing the yard, dancing, swimming, bicycling, etc.  Eat a healthy diet- Eat a variety of healthy food such as fruits, vegetables, low fat milk, low fat cheese, yogurt, lean meant, poultry, fish, beans, tofu, etc. For more information go to www.thenutritionsource.org  Drink alcohol in moderation- Limit alcohol intake to less than two drinks a day. Never drink and drive.  Dentist- Brush and floss twice daily; visit your dentist twice a year.  Depression- Your emotional health is as important as your physical health. If you're feeling down, or losing interest in things you would normally enjoy please talk to your healthcare provider.  Eye exam- Visit your eye doctor every year.  Safe sex- If you may be exposed to a sexually transmitted infection, use a condom.  Seat belts- Seat belts can save your life; always wear one.  Smoke/Carbon Monoxide detectors- These detectors need to be installed on the appropriate level of your home.  Replace batteries at least once a year.  Skin cancer- When out in the sun, cover up and use sunscreen 15 SPF or higher.  Violence- If  anyone is threatening you, please tell your healthcare provider.  Living Will/ Health care power of attorney- Speak with your healthcare provider and family.   IF you received an x-ray today, you will receive an invoice from [facility]. Please contact [facility] at [phone] with questions or concerns regarding your invoice.   IF you received labwork today, you will receive an invoice from [place]. Please contact [facility] at [phone] with questions or  concerns regarding your invoice.   Our billing staff will not be able to assist you with questions regarding bills from these companies.  You will be contacted with the lab results as soon as they are available. The fastest way to get your results is to activate your My Chart account. Instructions are located on the last page of this paperwork. If you have not heard from [place] regarding the results in 2 weeks, please contact this office.

## 2016-04-13 LAB — LIPID PANEL
Chol/HDL Ratio: 3.8 ratio units (ref 0.0–5.0)
Cholesterol, Total: 226 mg/dL — ABNORMAL HIGH (ref 100–199)
HDL: 60 mg/dL (ref >39)
LDL CALC: 137 mg/dL — AB (ref 0–99)
TRIGLYCERIDES: 144 mg/dL (ref 0–149)
VLDL Cholesterol Cal: 29 mg/dL (ref 5–40)

## 2016-04-13 LAB — COMPREHENSIVE METABOLIC PANEL
ALBUMIN: 4.4 g/dL (ref 3.5–4.8)
ALT: 11 IU/L (ref 0–44)
AST: 14 IU/L (ref 0–40)
Albumin/Globulin Ratio: 1.6 (ref 1.2–2.2)
Alkaline Phosphatase: 66 IU/L (ref 39–117)
BILIRUBIN TOTAL: 0.4 mg/dL (ref 0.0–1.2)
BUN / CREAT RATIO: 17 (ref 10–24)
BUN: 18 mg/dL (ref 8–27)
CHLORIDE: 96 mmol/L (ref 96–106)
CO2: 27 mmol/L (ref 18–29)
CREATININE: 1.06 mg/dL (ref 0.76–1.27)
Calcium: 9.2 mg/dL (ref 8.6–10.2)
GFR calc Af Amer: 77 mL/min/{1.73_m2} (ref >59)
GFR calc non Af Amer: 67 mL/min/{1.73_m2} (ref >59)
Globulin, Total: 2.8 g/dL (ref 1.5–4.5)
Glucose: 109 mg/dL — ABNORMAL HIGH (ref 65–99)
POTASSIUM: 3.2 mmol/L — AB (ref 3.5–5.2)
SODIUM: 141 mmol/L (ref 134–144)
TOTAL PROTEIN: 7.2 g/dL (ref 6.0–8.5)

## 2016-04-13 LAB — HEMOGLOBIN A1C
ESTIMATED AVERAGE GLUCOSE: 128 mg/dL
Hgb A1c MFr Bld: 6.1 % — ABNORMAL HIGH (ref 4.8–5.6)

## 2016-04-17 ENCOUNTER — Other Ambulatory Visit: Payer: Self-pay | Admitting: Family Medicine

## 2016-04-17 ENCOUNTER — Telehealth: Payer: Self-pay | Admitting: Emergency Medicine

## 2016-04-17 MED ORDER — POTASSIUM CHLORIDE 20 MEQ PO PACK: 20.0000 meq | PACK | Freq: Every day | ORAL | 0 refills | Status: DC

## 2016-04-17 NOTE — Telephone Encounter (Signed)
-----   Message from Wendie Agreste, MD sent at 04/17/2016  8:24 AM EDT ----- Call patient.   Potassium was again low, and at that level needs to take potassium supplement. I sent in a prescription for potassium, 1 pill once per day. Recheck in the next 2 weeks for repeat testing, and to decide if long term use needed.   cholesterol slightly elevated, blood sugar in prediabetes level. Watch diet, walking for exercise, and recheck levels in the next 6 months.

## 2016-10-16 ENCOUNTER — Other Ambulatory Visit: Payer: Self-pay | Admitting: Family Medicine

## 2016-10-16 DIAGNOSIS — I10 Essential (primary) hypertension: Secondary | ICD-10-CM

## 2016-10-18 ENCOUNTER — Encounter: Payer: Self-pay | Admitting: Family Medicine

## 2016-10-18 ENCOUNTER — Ambulatory Visit (INDEPENDENT_AMBULATORY_CARE_PROVIDER_SITE_OTHER): Payer: Medicare Other | Admitting: Family Medicine

## 2016-10-18 VITALS — BP 142/89 | HR 96 | Temp 98.6°F | Resp 16 | Ht 65.75 in | Wt 208.0 lb

## 2016-10-18 DIAGNOSIS — E876 Hypokalemia: Secondary | ICD-10-CM | POA: Diagnosis not present

## 2016-10-18 DIAGNOSIS — E785 Hyperlipidemia, unspecified: Secondary | ICD-10-CM | POA: Diagnosis not present

## 2016-10-18 DIAGNOSIS — Z23 Encounter for immunization: Secondary | ICD-10-CM | POA: Diagnosis not present

## 2016-10-18 DIAGNOSIS — I10 Essential (primary) hypertension: Secondary | ICD-10-CM

## 2016-10-18 DIAGNOSIS — R7303 Prediabetes: Secondary | ICD-10-CM | POA: Diagnosis not present

## 2016-10-18 MED ORDER — AMLODIPINE BESYLATE 2.5 MG PO TABS: 2.5000 mg | ORAL_TABLET | Freq: Every day | ORAL | 1 refills | Status: DC

## 2016-10-18 MED ORDER — AMLODIPINE BESYLATE 5 MG PO TABS: 5.0000 mg | ORAL_TABLET | Freq: Every day | ORAL | 1 refills | Status: DC

## 2016-10-18 MED ORDER — LISINOPRIL-HYDROCHLOROTHIAZIDE 20-25 MG PO TABS: 1.0000 | ORAL_TABLET | Freq: Every day | ORAL | 0 refills | Status: DC

## 2016-10-18 NOTE — Progress Notes (Signed)
Subjective:  By signing my name below, I, Steven Vargas, attest that this documentation has been prepared under the direction and in the presence of Steven Ray, MD. Electronically Signed: Moises Vargas, [place]. 10/18/2016 , 12:02 PM .  Patient was seen in Room 12 .   Patient ID: Steven Vargas, male    DOB: [birth year], 81 y.o.   MRN: [id number] Chief Complaint  Patient presents with  . Annual Exam   HPI Steven Vargas is a 81 y.o. male  Patient was initially here for an annual exam, but he did have a physical / Medicare annual wellness visit in March. He hasn't taken his medications yet today.   HTN He is on Norvasc 7.5mg  QD and lisinopril-HCTZ 20-25mg  QD. He denies lightheadedness, dizziness, shortness of breath, chest tightness, or chest pain.   He hasn't been checking his BP at home.   Hypokalemia His potassium was 3.2 in March. He was started on potassium supplement. He was advised to recheck in 2 weeks to see if more supplement was needed. He has not returned for repeat Vargas work.   Patient states he never returned for Vargas work because his supplement showed up late. He initially took about 10 days straight, then stopped, and then restarted 2 weeks later. He's been taking it intermittently. In the past week, he's taken 3 tablets. He's been trying to increase intake of spinach.   Lab Results  Component Value Date   CREATININE 1.06 04/12/2016   Wt Readings from Last 3 Encounters:  10/18/16 208 lb (94.3 kg)  04/12/16 217 lb 9.6 oz (98.7 kg)  01/12/16 218 lb 12.8 oz (99.2 kg)    Hyperglycemia His glucose was 107 in Jan. He is pre-diabetic by A1C of 6.1 in March.   Hyperlipidemia Lab Results  Component Value Date   CHOL 226 (H) 04/12/2016   HDL 60 04/12/2016   LDLCALC 137 (H) 04/12/2016   TRIG 144 04/12/2016   CHOLHDL 3.8 04/12/2016   Lab Results  Component Value Date   ALT 11 04/12/2016   AST 14 04/12/2016   ALKPHOS 66 04/12/2016   BILITOT 0.4 04/12/2016   He  was recommended increase in exercise and improved diet. His weight has decreased as above. He is not on a statin.   Patient Active Problem List   Diagnosis Date Noted  . Diverticulosis of colon (without mention of hemorrhage) 04/06/2011  . Benign neoplasm of colon 04/06/2011  . Diverticulosis of colon with hemorrhage 04/06/2011  . Diaphragmatic hernia without mention of obstruction or gangrene 04/06/2011  . Hyperglycemia 04/05/2011  . Syncope and collapse 04/05/2011  . Ankle fracture, lateral malleolus, closed 04/05/2011  . Acute posthemorrhagic anemia 04/05/2011  . Vargas in stool 04/05/2011  . Acute GI bleeding 04/05/2011   Past Medical History:  Diagnosis Date  . Gastric ulcer    secondary to nsaid 10 years ago  . GI bleed 04/04/2011  . Hip pain, chronic    right  . HLD (hyperlipidemia)   . Hypertension   . Obesity (BMI 35.0-39.9 without comorbidity) 03/2011  . Stroke [facility])    2005   Past Surgical History:  Procedure Laterality Date  . TONSILLECTOMY     No Known Allergies Prior to Admission medications   Medication Sig Start Date End Date Taking? Authorizing Provider  amLODipine (NORVASC) 2.5 MG tablet Take 1 tablet (2.5 mg total) by mouth daily. 04/12/16  Yes Wendie Agreste, MD  amLODipine (NORVASC) 5 MG tablet Take 1 tablet (5 mg total)  by mouth daily. 04/12/16  Yes Wendie Agreste, MD  lisinopril-hydrochlorothiazide (PRINZIDE,ZESTORETIC) 20-25 MG tablet TAKE 1 TABLET BY MOUTH  DAILY 10/16/16  Yes Wendie Agreste, MD  potassium chloride (KLOR-CON) 20 MEQ packet Take 20 mEq by mouth daily. 04/17/16  Yes Wendie Agreste, MD   Social History   Social History  . Marital status: Married    Spouse name: [name]  . Number of children: [name]  . Years of education: [name]   Occupational History  . Not on file.   Social History Main Topics  . Smoking status: Former Research scientist (life sciences)  . Smokeless tobacco: Never Used  . Alcohol use Yes     Comment: occasional  . Drug use: No  . Sexual  activity: Not Currently   Other Topics Concern  . Not on file   Social History Narrative   Lives in [place], [place] with his wife and son. 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 Drinks 2-3 beers once every few weeks. Smoking 2 years ago. Used to smoke 2-3 packs per day before that for past many years. Never did illicit drugs. Has [employer] health care Medicare.   Review of Systems  Constitutional: Negative for fatigue and unexpected weight change.  Eyes: Negative for visual disturbance.  Respiratory: Negative for cough, chest tightness and shortness of breath.   Cardiovascular: Negative for chest pain, palpitations and leg swelling.  Gastrointestinal: Negative for abdominal pain and Vargas in stool.  Neurological: Negative for dizziness, light-headedness and headaches.        Objective:   Physical Exam  Constitutional: He is oriented to person, place, and time. He appears well-developed and well-nourished.  HENT:  Head: Normocephalic and atraumatic.  Eyes: Pupils are equal, round, and reactive to light. EOM are normal.  Neck: No JVD present. Carotid bruit is not present.  Cardiovascular: Normal rate, regular rhythm and normal heart sounds.   No murmur heard. Pulmonary/Chest: Effort normal and breath sounds normal. He has no rales.  Musculoskeletal: He exhibits no edema.  Neurological: He is alert and oriented to person, place, and time.  Skin: Skin is warm and dry.  Psychiatric: He has a normal mood and affect.  Vitals reviewed.   Vitals:   10/18/16 1118  BP: (!) 142/89  Pulse: 96  Resp: 16  Temp: 98.6 F (37 C)  TempSrc: Oral  SpO2: 95%  Weight: 208 lb (94.3 kg)  Height: 5' 5.75" (1.67 m)   (He hasn't taken his medications today)    Assessment & Plan:    Steven Vargas is a 81 y.o. male Essential hypertension - Plan: Comprehensive metabolic panel, amLODipine (NORVASC) 2.5 MG tablet, amLODipine (NORVASC) 5 MG tablet, lisinopril-hydrochlorothiazide  (PRINZIDE,ZESTORETIC) 20-25 MG tablet  - Elevated in office, but has not yet taken medication. Monitor home BPs, return if over 130/80 consistently  Need for prophylactic vaccination and inoculation against influenza - Plan: Flu Vaccine QUAD 36+ mos IM  Prediabetes - Plan: Hemoglobin A1c  -Diet/exercise discussed, check A1c  Hypokalemia - Plan: Comprehensive metabolic panel  -Likely will need supplement, but will check level on CMP.  Hyperlipidemia, unspecified hyperlipidemia type - Plan: Comprehensive metabolic panel, Lipid panel  -Check labs to determine if statin needed.   Meds ordered this encounter  Medications  . amLODipine (NORVASC) 2.5 MG tablet    Sig: Take 1 tablet (2.5 mg total) by mouth daily.    Dispense:  90 tablet    Refill:  1  . amLODipine (NORVASC) 5  MG tablet    Sig: Take 1 tablet (5 mg total) by mouth daily.    Dispense:  90 tablet    Refill:  1  . lisinopril-hydrochlorothiazide (PRINZIDE,ZESTORETIC) 20-25 MG tablet    Sig: Take 1 tablet by mouth daily.    Dispense:  90 tablet    Refill:  0   Patient Instructions   Keep a record of your Vargas pressures outside of the office and if those readings are over 140/90, please call so we can adjust your medication.  Continue diet changes and activity/low intensity exercise to help with weight loss and your Vargas sugars. See information on prediabetes below.  Cholesterol was slightly elevated previously, I am rechecking that again today.  We will recheck potassium today to decide if daily supplement is needed.   Recheck in 6 months, sooner if needed  Prediabetes Prediabetes is the condition of having a Vargas sugar (Vargas glucose) level that is higher than it should be, but not high enough for you to be diagnosed with type 2 diabetes. Having prediabetes puts you at risk for developing type 2 diabetes (type 2 diabetes mellitus). Prediabetes may be called impaired glucose tolerance or impaired fasting  glucose. Prediabetes usually does not cause symptoms. Your health care provider can diagnose this condition with Vargas tests. You may be tested for prediabetes if you are overweight and if you have at least one other risk factor for prediabetes. Risk factors for prediabetes include:  Having a family member with type 2 diabetes.  Being overweight or obese.  Being older than age 58.  Being of American-Indian, African-American, Hispanic/Latino, or Asian/Pacific Islander descent.  Having an inactive (sedentary) lifestyle.  Having a history of gestational diabetes or polycystic ovarian syndrome (PCOS).  Having low levels of good cholesterol (HDL-C) or high levels of Vargas fats (triglycerides).  Having high Vargas pressure.  What is Vargas glucose and how is Vargas glucose measured?  Vargas glucose refers to the amount of glucose in your bloodstream. Glucose comes from eating foods that contain sugars and starches (carbohydrates) that the body breaks down into glucose. Your Vargas glucose level may be measured in mg/dL (milligrams per deciliter) or mmol/L (millimoles per liter).Your Vargas glucose may be checked with one or more of the following Vargas tests:  A fasting Vargas glucose (FBG) test. You will not be allowed to eat (you will fast) for at least 8 hours before a Vargas sample is taken. ? A normal range for FBG is 70-100 mg/dl (3.9-5.6 mmol/L).  An A1c (hemoglobin A1c) Vargas test. This test provides information about Vargas glucose control over the previous 2?35months.  An oral glucose tolerance test (OGTT). This test measures your Vargas glucose twice: ? After fasting. This is your baseline level. ? Two hours after you drink a beverage that contains glucose.  You may be diagnosed with prediabetes:  If your FBG is 100?125 mg/dL (5.6-6.9 mmol/L).  If your A1c level is 5.7?6.4%.  If your OGGT result is 140?199 mg/dL (7.8-11 mmol/L).  These Vargas tests may be repeated to confirm your  diagnosis. What happens if Vargas glucose is too high? The pancreas produces a hormone (insulin) that helps move glucose from the bloodstream into cells. When cells in the body do not respond properly to insulin that the body makes (insulin resistance), excess glucose builds up in the Vargas instead of going into cells. As a result, high Vargas glucose (hyperglycemia) can develop, which can cause many complications. This is a symptom of prediabetes. What  can happen if Vargas glucose stays higher than normal for a long time? Having high Vargas glucose for a long time is dangerous. Too much glucose in your Vargas can damage your nerves and Vargas vessels. Long-term damage can lead to complications from diabetes, which may include:  Heart disease.  Stroke.  Blindness.  Kidney disease.  Depression.  Poor circulation in the feet and legs, which could lead to surgical removal (amputation) in severe cases.  How can prediabetes be prevented from turning into type 2 diabetes?  To help prevent type 2 diabetes, take the following actions:  Be physically active. ? Do moderate-intensity physical activity for at least 30 minutes on at least 5 days of the week, or as much as told by your health care provider. This could be brisk walking, biking, or water aerobics. ? Ask your health care provider what activities are safe for you. A mix of physical activities may be best, such as walking, swimming, cycling, and strength training.  Lose weight as told by your health care provider. ? Losing 5-7% of your body weight can reverse insulin resistance. ? Your health care provider can determine how much weight loss is best for you and can help you lose weight safely.  Follow a healthy meal plan. This includes eating lean proteins, complex carbohydrates, fresh fruits and vegetables, low-fat dairy products, and healthy fats. ? Follow instructions from your health care provider about eating or drinking  restrictions. ? Make an appointment to see a diet and nutrition specialist (registered dietitian) to help you create a healthy eating plan that is right for you.  Do not smoke or use any tobacco products, such as cigarettes, chewing tobacco, and e-cigarettes. If you need help quitting, ask your health care provider.  Take over-the-counter and prescription medicines as told by your health care provider. You may be prescribed medicines that help lower the risk of type 2 diabetes.  This information is not intended to replace advice given to you by your health care provider. Make sure you discuss any questions you have with your health care provider. Document Released: 05/09/2015 Document Revised: 06/23/2015 Document Reviewed: 03/08/2015 Elsevier Interactive Patient Education  2018 [employer].    IF you received an x-Vargas today, you will receive an invoice from [facility]. Please contact [facility] at [phone] with questions or concerns regarding your invoice.   IF you received labwork today, you will receive an invoice from [facility]. Please contact [facility] at [phone] with questions or concerns regarding your invoice.   Our billing staff will not be able to assist you with questions regarding bills from these companies.  You will be contacted with the lab results as soon as they are available. The fastest way to get your results is to activate your My Chart account. Instructions are located on the last page of this paperwork. If you have not heard from [place] regarding the results in 2 weeks, please contact this office.        I personally performed the services described in this documentation, which was scribed in my presence. The recorded information has been reviewed and considered for accuracy and completeness, addended by me as needed, and agree with information above.  Signed,   Steven Ray, MD [facility].   10/21/16 12:23 PM

## 2016-10-18 NOTE — Patient Instructions (Addendum)
Keep a record of your blood pressures outside of the office and if those readings are over 140/90, please call so we can adjust your medication.  Continue diet changes and activity/low intensity exercise to help with weight loss and your blood sugars. See information on prediabetes below.  Cholesterol was slightly elevated previously, I am rechecking that again today.  We will recheck potassium today to decide if daily supplement is needed.   Recheck in 6 months, sooner if needed  Prediabetes Prediabetes is the condition of having a blood sugar (blood glucose) level that is higher than it should be, but not high enough for you to be diagnosed with type 2 diabetes. Having prediabetes puts you at risk for developing type 2 diabetes (type 2 diabetes mellitus). Prediabetes may be called impaired glucose tolerance or impaired fasting glucose. Prediabetes usually does not cause symptoms. Your health care provider can diagnose this condition with blood tests. You may be tested for prediabetes if you are overweight and if you have at least one other risk factor for prediabetes. Risk factors for prediabetes include:  Having a family member with type 2 diabetes.  Being overweight or obese.  Being older than age 79.  Being of American-Indian, African-American, Hispanic/Latino, or Asian/Pacific Islander descent.  Having an inactive (sedentary) lifestyle.  Having a history of gestational diabetes or polycystic ovarian syndrome (PCOS).  Having low levels of good cholesterol (HDL-C) or high levels of blood fats (triglycerides).  Having high blood pressure.  What is blood glucose and how is blood glucose measured?  Blood glucose refers to the amount of glucose in your bloodstream. Glucose comes from eating foods that contain sugars and starches (carbohydrates) that the body breaks down into glucose. Your blood glucose level may be measured in mg/dL (milligrams per deciliter) or mmol/L (millimoles  per liter).Your blood glucose may be checked with one or more of the following blood tests:  A fasting blood glucose (FBG) test. You will not be allowed to eat (you will fast) for at least 8 hours before a blood sample is taken. ? A normal range for FBG is 70-100 mg/dl (3.9-5.6 mmol/L).  An A1c (hemoglobin A1c) blood test. This test provides information about blood glucose control over the previous 2?61months.  An oral glucose tolerance test (OGTT). This test measures your blood glucose twice: ? After fasting. This is your baseline level. ? Two hours after you drink a beverage that contains glucose.  You may be diagnosed with prediabetes:  If your FBG is 100?125 mg/dL (5.6-6.9 mmol/L).  If your A1c level is 5.7?6.4%.  If your OGGT result is 140?199 mg/dL (7.8-11 mmol/L).  These blood tests may be repeated to confirm your diagnosis. What happens if blood glucose is too high? The pancreas produces a hormone (insulin) that helps move glucose from the bloodstream into cells. When cells in the body do not respond properly to insulin that the body makes (insulin resistance), excess glucose builds up in the blood instead of going into cells. As a result, high blood glucose (hyperglycemia) can develop, which can cause many complications. This is a symptom of prediabetes. What can happen if blood glucose stays higher than normal for a long time? Having high blood glucose for a long time is dangerous. Too much glucose in your blood can damage your nerves and blood vessels. Long-term damage can lead to complications from diabetes, which may include:  Heart disease.  Stroke.  Blindness.  Kidney disease.  Depression.  Poor circulation in the feet  and legs, which could lead to surgical removal (amputation) in severe cases.  How can prediabetes be prevented from turning into type 2 diabetes?  To help prevent type 2 diabetes, take the following actions:  Be physically active. ? Do  moderate-intensity physical activity for at least 30 minutes on at least 5 days of the week, or as much as told by your health care provider. This could be brisk walking, biking, or water aerobics. ? Ask your health care provider what activities are safe for you. A mix of physical activities may be best, such as walking, swimming, cycling, and strength training.  Lose weight as told by your health care provider. ? Losing 5-7% of your body weight can reverse insulin resistance. ? Your health care provider can determine how much weight loss is best for you and can help you lose weight safely.  Follow a healthy meal plan. This includes eating lean proteins, complex carbohydrates, fresh fruits and vegetables, low-fat dairy products, and healthy fats. ? Follow instructions from your health care provider about eating or drinking restrictions. ? Make an appointment to see a diet and nutrition specialist (registered dietitian) to help you create a healthy eating plan that is right for you.  Do not smoke or use any tobacco products, such as cigarettes, chewing tobacco, and e-cigarettes. If you need help quitting, ask your health care provider.  Take over-the-counter and prescription medicines as told by your health care provider. You may be prescribed medicines that help lower the risk of type 2 diabetes.  This information is not intended to replace advice given to you by your health care provider. Make sure you discuss any questions you have with your health care provider. Document Released: 05/09/2015 Document Revised: 06/23/2015 Document Reviewed: 03/08/2015 Elsevier Interactive Patient Education  2018 [employer].    IF you received an x-ray today, you will receive an invoice from [facility]. Please contact [facility] Radiology at [phone] with questions or concerns regarding your invoice.   IF you received labwork today, you will receive an invoice from [place]. Please contact  [facility] at [phone] with questions or concerns regarding your invoice.   Our billing staff will not be able to assist you with questions regarding bills from these companies.  You will be contacted with the lab results as soon as they are available. The fastest way to get your results is to activate your My Chart account. Instructions are located on the last page of this paperwork. If you have not heard from [place] regarding the results in 2 weeks, please contact this office.

## 2016-10-19 LAB — HEMOGLOBIN A1C
Est. average glucose Bld gHb Est-mCnc: 128 mg/dL
Hgb A1c MFr Bld: 6.1 % — ABNORMAL HIGH (ref 4.8–5.6)

## 2016-10-19 LAB — LIPID PANEL
CHOLESTEROL TOTAL: 218 mg/dL — AB (ref 100–199)
Chol/HDL Ratio: 3.6 ratio (ref 0.0–5.0)
HDL: 61 mg/dL (ref >39)
LDL Calculated: 132 mg/dL — ABNORMAL HIGH (ref 0–99)
TRIGLYCERIDES: 126 mg/dL (ref 0–149)
VLDL CHOLESTEROL CAL: 25 mg/dL (ref 5–40)

## 2016-10-19 LAB — COMPREHENSIVE METABOLIC PANEL
ALK PHOS: 76 IU/L (ref 39–117)
ALT: 14 IU/L (ref 0–44)
AST: 15 IU/L (ref 0–40)
Albumin/Globulin Ratio: 1.5 (ref 1.2–2.2)
Albumin: 4.3 g/dL (ref 3.5–4.8)
BILIRUBIN TOTAL: 0.4 mg/dL (ref 0.0–1.2)
BUN/Creatinine Ratio: 14 (ref 10–24)
BUN: 17 mg/dL (ref 8–27)
CHLORIDE: 100 mmol/L (ref 96–106)
CO2: 23 mmol/L (ref 20–29)
Calcium: 9 mg/dL (ref 8.6–10.2)
Creatinine, Ser: 1.18 mg/dL (ref 0.76–1.27)
GFR calc Af Amer: 67 mL/min/{1.73_m2} (ref >59)
GFR calc non Af Amer: 58 mL/min/{1.73_m2} — ABNORMAL LOW (ref >59)
GLUCOSE: 104 mg/dL — AB (ref 65–99)
Globulin, Total: 2.9 g/dL (ref 1.5–4.5)
Potassium: 4.1 mmol/L (ref 3.5–5.2)
Sodium: 142 mmol/L (ref 134–144)
Total Protein: 7.2 g/dL (ref 6.0–8.5)

## 2016-12-04 ENCOUNTER — Other Ambulatory Visit: Payer: Self-pay

## 2016-12-04 NOTE — Patient Outreach (Signed)
[facility]) Care Management  12/04/2016  Steven Vargas [birth year] [id number]   Medication Adherence call to [name] patient is showing past due under [place] health Care Ins.on lisinopril/hctz 20/25 spoke to patient he said he still has medication until the end of the month patient is still taking one tablet at day,he said he will order at the end of the month.   [place] Management Direct Dial [phone]  Fax [phone] [email or web address]

## 2017-03-05 ENCOUNTER — Other Ambulatory Visit: Payer: Self-pay

## 2017-03-05 ENCOUNTER — Encounter: Payer: Self-pay | Admitting: Family Medicine

## 2017-03-05 ENCOUNTER — Ambulatory Visit: Payer: Medicare Other | Admitting: Family Medicine

## 2017-03-05 VITALS — BP 130/88 | HR 84 | Temp 98.1°F | Resp 16 | Ht 65.75 in | Wt 213.0 lb

## 2017-03-05 DIAGNOSIS — E876 Hypokalemia: Secondary | ICD-10-CM | POA: Diagnosis not present

## 2017-03-05 DIAGNOSIS — R7303 Prediabetes: Secondary | ICD-10-CM

## 2017-03-05 DIAGNOSIS — I10 Essential (primary) hypertension: Secondary | ICD-10-CM

## 2017-03-05 LAB — COMPREHENSIVE METABOLIC PANEL
A/G RATIO: 1.6 (ref 1.2–2.2)
ALT: 15 IU/L (ref 0–44)
AST: 16 IU/L (ref 0–40)
Albumin: 4.3 g/dL (ref 3.5–4.8)
Alkaline Phosphatase: 75 IU/L (ref 39–117)
BUN/Creatinine Ratio: 17 (ref 10–24)
BUN: 17 mg/dL (ref 8–27)
Bilirubin Total: 0.5 mg/dL (ref 0.0–1.2)
CALCIUM: 9 mg/dL (ref 8.6–10.2)
CHLORIDE: 104 mmol/L (ref 96–106)
CO2: 24 mmol/L (ref 20–29)
Creatinine, Ser: 0.99 mg/dL (ref 0.76–1.27)
GFR, EST AFRICAN AMERICAN: 83 mL/min/{1.73_m2} (ref >59)
GFR, EST NON AFRICAN AMERICAN: 72 mL/min/{1.73_m2} (ref >59)
Globulin, Total: 2.7 g/dL (ref 1.5–4.5)
Glucose: 99 mg/dL (ref 65–99)
POTASSIUM: 4.6 mmol/L (ref 3.5–5.2)
Sodium: 146 mmol/L — ABNORMAL HIGH (ref 134–144)
TOTAL PROTEIN: 7 g/dL (ref 6.0–8.5)

## 2017-03-05 LAB — HEMOGLOBIN A1C
ESTIMATED AVERAGE GLUCOSE: 126 mg/dL
Hgb A1c MFr Bld: 6 % — ABNORMAL HIGH (ref 4.8–5.6)

## 2017-03-05 MED ORDER — AMLODIPINE BESYLATE 5 MG PO TABS: 5.0000 mg | ORAL_TABLET | Freq: Every day | ORAL | 1 refills | Status: DC

## 2017-03-05 NOTE — Patient Instructions (Addendum)
  I will restart just the amlodipine at 5mg  once per day for now. Check your blood pressure once daily - use an upper arm machine. If readings are over 140/90 - call and we can add in other meds.   Otherwise follow up in next 3-4 weeks to discuss meds further.   Let me know if there are questions in the meantime.    IF you received an x-ray today, you will receive an invoice from [facility]. Please contact [facility] at [phone] with questions or concerns regarding your invoice.   IF you received labwork today, you will receive an invoice from [facility]. Please contact [facility] at [phone] with questions or concerns regarding your invoice.   Our billing staff will not be able to assist you with questions regarding bills from these companies.  You will be contacted with the lab results as soon as they are available. The fastest way to get your results is to activate your My Chart account. Instructions are located on the last page of this paperwork. If you have not heard from [place] regarding the results in 2 weeks, please contact this office.

## 2017-03-05 NOTE — Progress Notes (Signed)
Subjective:  By signing my name below, I, Steven Vargas, attest that this documentation has been prepared under the direction and in the presence of Steven Agreste, MD Electronically Signed: Ladene Vargas, ED Scribe 03/05/2017 at 11:11 AM.   Patient ID: Steven Vargas, male    DOB: 1935/05/15, 82 y.o.   MRN: [id number]  Chief Complaint  Patient presents with  . Chronic Conditions    follow-up   . Medication Refill    all meds    HPI  Steven Vargas is a 82 y.o. male who presents to Primary Care at [facility] for f/u. Pt is fasting at this visit.  HTN Norvasc 7.5 mg qd and Lisinopril-HCTZ 20-25 mg. Potassium 20 mEq qd. --- Pt states that he ran out of Lisinopril-HCTZ at the beginning of Nov and ran out of Norvasc 2 days ago. He does not check his BP outside of the office. Denies cp, sob, lightheadedness, dizziness, HA, blood in stools, melena. BP Readings from Last 3 Encounters:  03/05/17 130/88  10/18/16 (!) 142/89  04/12/16 140/80   Hyperglycemia Lab Results  Component Value Date   HGBA1C 6.1 (H) 10/18/2016   Wt Readings from Last 3 Encounters:  03/05/17 213 lb (96.6 kg)  10/18/16 208 lb (94.3 kg)  04/12/16 217 lb 9.6 oz (98.7 kg)  Denies polydipsia, urinary frequency, dizziness.  Hypokalemia  Was taking a potassium supplement intermittently at last visit. Potassium was normal at 4.1 in Sept. --- He ran out of his potassium pill but has been taking V8 and eating spinach.  Patient Active Problem List   Diagnosis Date Noted  . Diverticulosis of colon (without mention of hemorrhage) 04/06/2011  . Benign neoplasm of colon 04/06/2011  . Diverticulosis of colon with hemorrhage 04/06/2011  . Diaphragmatic hernia without mention of obstruction or gangrene 04/06/2011  . Hyperglycemia 04/05/2011  . Syncope and collapse 04/05/2011  . Ankle fracture, lateral malleolus, closed 04/05/2011  . Acute posthemorrhagic anemia 04/05/2011  . Blood in stool 04/05/2011  . Acute GI bleeding  04/05/2011   Past Medical History:  Diagnosis Date  . Gastric ulcer    secondary to nsaid 10 years ago  . GI bleed 04/04/2011  . Hip pain, chronic    right  . HLD (hyperlipidemia)   . Hypertension   . Obesity (BMI 35.0-39.9 without comorbidity) 03/2011  . Stroke [facility])    2005   Past Surgical History:  Procedure Laterality Date  . TONSILLECTOMY     No Known Allergies Prior to Admission medications   Medication Sig Start Date End Date Taking? Authorizing Provider  amLODipine (NORVASC) 2.5 MG tablet Take 1 tablet (2.5 mg total) by mouth daily. 10/18/16   Steven Agreste, MD  amLODipine (NORVASC) 5 MG tablet Take 1 tablet (5 mg total) by mouth daily. 10/18/16   Steven Agreste, MD  lisinopril-hydrochlorothiazide (PRINZIDE,ZESTORETIC) 20-25 MG tablet Take 1 tablet by mouth daily. 10/18/16   Steven Agreste, MD  potassium chloride (KLOR-CON) 20 MEQ packet Take 20 mEq by mouth daily. 04/17/16   Steven Agreste, MD   Social History   Socioeconomic History  . Marital status: Married    Spouse name: Not on file  . Number of children: Not on file  . Years of education: Not on file  . Highest education level: Not on file  Social Needs  . Financial resource strain: Not on file  . Food insecurity - worry: Not on file  . Food insecurity - inability: Not on file  .  Transportation needs - medical: Not on file  . Transportation needs - non-medical: Not on file  Occupational History  . Not on file  Tobacco Use  . Smoking status: Former Research scientist (life sciences)  . Smokeless tobacco: Never Used  Substance and Sexual Activity  . Alcohol use: Yes    Comment: occasional  . Drug use: No  . Sexual activity: Not Currently  Other Topics Concern  . Not on file  Social History Narrative   Lives in [place], [place] with his wife and son. 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 Drinks 2-3 beers once every few weeks. Smoking 2 years ago. Used to smoke 2-3 packs per day before that for past  many years. Never did illicit drugs. Has United health care Medicare.   Review of Systems  Constitutional: Negative for fatigue and unexpected weight change.  Eyes: Negative for visual disturbance.  Respiratory: Negative for cough, chest tightness and shortness of breath.   Cardiovascular: Negative for chest pain, palpitations and leg swelling.  Gastrointestinal: Negative for abdominal pain and blood in stool.  Endocrine: Negative for polydipsia and polyuria.  Genitourinary: Negative for frequency.  Neurological: Negative for dizziness, light-headedness and headaches.      Objective:   Physical Exam  Constitutional: He is oriented to person, place, and time. He appears well-developed and well-nourished.  HENT:  Head: Normocephalic and atraumatic.  Eyes: EOM are normal. Pupils are equal, round, and reactive to light.  Neck: No JVD present. Carotid bruit is not present.  Cardiovascular: Normal rate, regular rhythm and normal heart sounds.  No murmur heard. Pulmonary/Chest: Effort normal and breath sounds normal. He has no rales.  Musculoskeletal: He exhibits no edema.  Neurological: He is alert and oriented to person, place, and time.  Skin: Skin is warm and dry.  Psychiatric: He has a normal mood and affect.  Vitals reviewed.  Vitals:   03/05/17 1043  BP: 130/88  Pulse: 84  Resp: 16  Temp: 98.1 F (36.7 C)  TempSrc: Oral  SpO2: 95%  Weight: 213 lb (96.6 kg)  Height: 5' 5.75" (1.67 m)      Assessment & Plan:   Steven Vargas is a 82 y.o. male Prediabetes - Plan: Hemoglobin A1c  - Recheck A1c, watch diet, activity.  Essential hypertension - Plan: amLODipine (NORVASC) 5 MG tablet  -Reading today actually looks okay off medicines for 2 days, and was only on amlodipine at that time. Likely will not need lisinopril HCTZ, and will restart at 5 mg amlodipine once per day with close monitoring outside of office. If over 140/90, advised to call to change regimen. Either way  follow-up in 3-4 weeks. Understanding expressed.   Hypokalemia - Plan: Comprehensive metabolic panel  -Previously controlled. Likely will be normal again as off lisinopril HCTZ.  Recheck 1 month  Meds ordered this encounter  Medications  . amLODipine (NORVASC) 5 MG tablet    Sig: Take 1 tablet (5 mg total) by mouth daily.    Dispense:  90 tablet    Refill:  1   Patient Instructions    I will restart just the amlodipine at 5mg  once per day for now. Check your blood pressure once daily - use an upper arm machine. If readings are over 140/90 - call and we can add in other meds.   Otherwise follow up in next 3-4 weeks to discuss meds further.   Let me know if there are questions in the meantime.    IF you  received an x-ray today, you will receive an invoice from [facility]. Please contact [facility] at [phone] with questions or concerns regarding your invoice.   IF you received labwork today, you will receive an invoice from [place]. Please contact [facility] at [phone] with questions or concerns regarding your invoice.   Our billing staff will not be able to assist you with questions regarding bills from these companies.  You will be contacted with the lab results as soon as they are available. The fastest way to get your results is to activate your My Chart account. Instructions are located on the last page of this paperwork. If you have not heard from [place] regarding the results in 2 weeks, please contact this office.       I personally performed the services described in this documentation, which was scribed in my presence. The recorded information has been reviewed and considered for accuracy and completeness, addended by me as needed, and agree with information above.  Signed,   [name], MD Primary Care at [place].  03/05/17 11:48 AM

## 2017-03-17 ENCOUNTER — Other Ambulatory Visit: Payer: Self-pay | Admitting: Family Medicine

## 2017-03-17 DIAGNOSIS — E870 Hyperosmolality and hypernatremia: Secondary | ICD-10-CM

## 2017-03-17 NOTE — Progress Notes (Signed)
Lab only visit for repeat BMP

## 2017-03-19 ENCOUNTER — Encounter: Payer: Self-pay | Admitting: *Deleted

## 2017-04-04 ENCOUNTER — Ambulatory Visit: Payer: Medicare Other | Admitting: Family Medicine

## 2017-04-04 ENCOUNTER — Other Ambulatory Visit: Payer: Self-pay

## 2017-04-04 ENCOUNTER — Encounter: Payer: Self-pay | Admitting: Family Medicine

## 2017-04-04 VITALS — BP 168/112 | HR 78 | Temp 98.2°F | Resp 18 | Ht 65.75 in | Wt 213.4 lb

## 2017-04-04 DIAGNOSIS — I10 Essential (primary) hypertension: Secondary | ICD-10-CM

## 2017-04-04 MED ORDER — LISINOPRIL-HYDROCHLOROTHIAZIDE 10-12.5 MG PO TABS: 1.0000 | ORAL_TABLET | Freq: Every day | ORAL | 1 refills | Status: DC

## 2017-04-04 NOTE — Progress Notes (Addendum)
Subjective:  This chart was scribed for Wendie Agreste, MD by Tamsen Roers, at [place] at [facility].  This patient was seen in room 9 and the patient's care was started at 11:34 AM.   Chief Complaint  Patient presents with  . Hypertension    follow up      Patient ID: Steven Vargas, male    DOB: [birth year], 82 y.o.   MRN: [id number]  HPI HPI Comments: Steven Vargas is a 82 y.o. male who presents to Primary Care at [facility] for a follow up of hypertension. I just saw him February 5th.  He had been out of his Lisinopril HCTZ since November and Norvasc two days prior, but blood pressure looked okay at 130/88. Previously he had been on lisinopril hctz 20-25 mg that was held at last visit.  Decided to restart only amilodipine at 5 mg QD with close monitoring. He had a history of hypokalemia but that was normal at last visit.  Sodium was borderline at 146. ---- Patient is getting home blood pressure readings ranging from low of 129/107 but usual readings are in the high 140's to the 160's-170's. Hear rate ranging around 60's to 80's. Patient has been compliant with his medications. Patient states that he feels "fine". Denies chest pains, SOB, lightheadedness/dizziness.   Prediabetes: A1C stable at last visit.  Lab Results  Component Value Date   HGBA1C 6.0 (H) 03/05/2017   Wt Readings from Last 3 Encounters:  04/04/17 213 lb 6.4 oz (96.8 kg)  03/05/17 213 lb (96.6 kg)  10/18/16 208 lb (94.3 kg)   Patient Active Problem List   Diagnosis Date Noted  . Diverticulosis of colon (without mention of hemorrhage) 04/06/2011  . Benign neoplasm of colon 04/06/2011  . Diverticulosis of colon with hemorrhage 04/06/2011  . Diaphragmatic hernia without mention of obstruction or gangrene 04/06/2011  . Hyperglycemia 04/05/2011  . Syncope and collapse 04/05/2011  . Ankle fracture, lateral malleolus, closed 04/05/2011  . Acute posthemorrhagic anemia 04/05/2011  . Blood in stool 04/05/2011  .  Acute GI bleeding 04/05/2011   Past Medical History:  Diagnosis Date  . Gastric ulcer    secondary to nsaid 10 years ago  . GI bleed 04/04/2011  . Hip pain, chronic    right  . HLD (hyperlipidemia)   . Hypertension   . Obesity (BMI 35.0-39.9 without comorbidity) 03/2011  . Stroke [facility])    2005   Past Surgical History:  Procedure Laterality Date  . TONSILLECTOMY     No Known Allergies Prior to Admission medications   Medication Sig Start Date End Date Taking? Authorizing Provider  amLODipine (NORVASC) 5 MG tablet Take 1 tablet (5 mg total) by mouth daily. 03/05/17   Wendie Agreste, MD   Social History   Socioeconomic History  . Marital status: Married    Spouse name: Not on file  . Number of children: Not on file  . Years of education: Not on file  . Highest education level: Not on file  Social Needs  . Financial resource strain: Not on file  . Food insecurity - worry: Not on file  . Food insecurity - inability: Not on file  . Transportation needs - medical: Not on file  . Transportation needs - non-medical: Not on file  Occupational History  . Not on file  Tobacco Use  . Smoking status: Former Research scientist (life sciences)  . Smokeless tobacco: Never Used  Substance and Sexual Activity  . Alcohol use: Yes  Comment: occasional  . Drug use: No  . Sexual activity: Not Currently  Other Topics Concern  . Not on file  Social History Narrative   Lives in [place], [place] with his wife and son. 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 Drinks 2-3 beers once every few weeks. Smoking 2 years ago. Used to smoke 2-3 packs per day before that for past many years. Never did illicit drugs. Has United health care Medicare.      Review of Systems  Constitutional: Negative for fatigue and unexpected weight change.  Eyes: Negative for visual disturbance.  Respiratory: Negative for cough, chest tightness and shortness of breath.   Cardiovascular: Negative for chest pain,  palpitations and leg swelling.  Gastrointestinal: Negative for abdominal pain and blood in stool.  Neurological: Negative for dizziness, light-headedness and headaches.       Objective:   Physical Exam  Constitutional: He is oriented to person, place, and time. He appears well-developed and well-nourished.  HENT:  Head: Normocephalic and atraumatic.  Eyes: EOM are normal. Pupils are equal, round, and reactive to light.  Neck: No JVD present. Carotid bruit is not present.  Cardiovascular: Normal rate, regular rhythm and normal heart sounds.  No murmur heard. Pulmonary/Chest: Effort normal and breath sounds normal. He has no rales.  Musculoskeletal: He exhibits no edema.  Neurological: He is alert and oriented to person, place, and time.  Skin: Skin is warm and dry.  Psychiatric: He has a normal mood and affect.  Vitals reviewed.  Vitals:   04/04/17 1044  BP: 140/80  Pulse: 78  Resp: 18  Temp: 98.2 F (36.8 C)  TempSrc: Oral  SpO2: 93%  Weight: 213 lb 6.4 oz (96.8 kg)  Height: 5' 5.75" (1.67 m)       Assessment & Plan:   Steven Vargas is a 82 y.o. male Essential hypertension - Plan: DISCONTINUED: lisinopril-hydrochlorothiazide (PRINZIDE,ZESTORETIC) 10-12.5 MG tablet  -Based on home readings initially planned to restart lisinopril/HCTZ in addition to his 5mg  amlodipine. However reading in office appears to be borderline elevated and much lower than his home reading today. He will return this afternoon with his home blood pressure machine to check against our readings to see if he is obtaining accurate readings. After that we'll decide on medication changes.  Return with home blood pressure monitor, and repeat testing in office, persistent elevated blood pressures, and consistent with his home monitor. Will restart lisinopril HCTZ at lower dose of 10/12.5, continue amlodipine, home monitoring with RTC precautions. Recheck 6 weeks with repeat lab work    Meds ordered this  encounter  Medications  . DISCONTD: lisinopril-hydrochlorothiazide (PRINZIDE,ZESTORETIC) 10-12.5 MG tablet    Sig: Take 1 tablet by mouth daily.    Dispense:  90 tablet    Refill:  1   Patient Instructions    Continue amlodipine 5 mg once per day. Based on your elevated readings at home, will restart lisinopril/HCTZ combination pill, but at half of the strength that you previously took. Continue to monitor your blood pressure readings and return in 6 weeks. At that time we will check some electrolytes/blood work as well.   If you are getting lightheaded, dizzy, or other new side effects of that medication, return sooner. Let me know if there are questions.    IF you received an x-ray today, you will receive an invoice from [facility]. Please contact [facility] at [phone] with questions or concerns regarding your invoice.   IF you  received labwork today, you will receive an invoice from [place]. Please contact [facility] at [phone] with questions or concerns regarding your invoice.   Our billing staff will not be able to assist you with questions regarding bills from these companies.  You will be contacted with the lab results as soon as they are available. The fastest way to get your results is to activate your My Chart account. Instructions are located on the last page of this paperwork. If you have not heard from [place] regarding the results in 2 weeks, please contact this office.      I personally performed the services described in this documentation, which was scribed in my presence. The recorded information has been reviewed and considered for accuracy and completeness, addended by me as needed, and agree with information above.  Signed,   [name], MD Primary Care at [place].  04/04/17 11:50 AM

## 2017-04-04 NOTE — Patient Instructions (Addendum)
Restart lower dose lisinopril/HCT and continue amlodipine 5mg  per day.  recheck in 6 weeks.   IF you received an x-ray today, you will receive an invoice from [facility]. Please contact [facility] at [phone] with questions or concerns regarding your invoice.   IF you received labwork today, you will receive an invoice from [place]. Please contact [facility] at [phone] with questions or concerns regarding your invoice.   Our billing staff will not be able to assist you with questions regarding bills from these companies.  You will be contacted with the lab results as soon as they are available. The fastest way to get your results is to activate your My Chart account. Instructions are located on the last page of this paperwork. If you have not heard from [place] regarding the results in 2 weeks, please contact this office.

## 2017-04-04 NOTE — Progress Notes (Signed)
Pt returned with home BP machine.  BP rechecked with manual cuff by RN - 168/112 and 180/112.  With machine in 195/119.  All checked in left arm while pt sitting.  Provider notified and will contact pt.

## 2017-04-06 ENCOUNTER — Telehealth: Payer: Self-pay

## 2017-04-06 NOTE — Telephone Encounter (Signed)
Provider, please see 04/04/17 progress note from Anon Raices, [place] and advise.

## 2017-04-06 NOTE — Telephone Encounter (Signed)
Called pt - 195/110, but down to 789 systolic now. No chest pains, dizziness, blurry vision, HA. Wanted to know the plan from last visit - reviewed plan. Starting lisinopril HCTZ 10/12.5, continue amlodipine 5 g daily. Recheck 6 weeks  I ordered lisinopril/HCTZ 2 days ago, but from mail order. With some persistent elevated readings, I offered local Rx in the meantime but he declined.  Advised to take 2 of amlodipine every day until new med arrives, then switch back to 1 per day. If blood pressure remains over 160/100 at home in the next few days, advised to call me or return for office visit. If any new symptoms including any headache, chest pain, dyspnea, lightheadedness, weakness, needs to be seen right away. Understanding expressed

## 2017-04-06 NOTE — Telephone Encounter (Signed)
Copied from [place] [phone]. Topic: Quick Communication - Other Results >> Apr 04, 2017  1:53 PM Scherrie Gerlach wrote: Pt states he missed a call from the office, but I do not see a message.  Pt states Dr Nyoka Cowden was to call him and let him know what to do.

## 2017-04-15 ENCOUNTER — Ambulatory Visit: Payer: Medicare Other

## 2017-04-15 ENCOUNTER — Encounter: Payer: Medicare Other | Admitting: Family Medicine

## 2017-05-16 ENCOUNTER — Ambulatory Visit: Payer: Medicare Other | Admitting: Family Medicine

## 2017-05-16 ENCOUNTER — Encounter: Payer: Self-pay | Admitting: Family Medicine

## 2017-05-16 DIAGNOSIS — I10 Essential (primary) hypertension: Secondary | ICD-10-CM | POA: Diagnosis not present

## 2017-05-16 MED ORDER — AMLODIPINE BESYLATE 10 MG PO TABS: 10.0000 mg | ORAL_TABLET | Freq: Every day | ORAL | 1 refills | Status: DC

## 2017-05-16 NOTE — Progress Notes (Signed)
Subjective:  By signing my name below, I, Essence Howell, attest that this documentation has been prepared under the direction and in the presence of Wendie Agreste, MD Electronically Signed: Ladene Artist, ED Scribe 05/16/2017 at 10:36 AM.   Patient ID: Steven Vargas, male    DOB: 1935/04/22, 82 y.o.   MRN: [id number]  Chief Complaint  Patient presents with  . Chronic Conditions    F/u and medication check   HPI Steven Vargas is a 83 y.o. male who presents to Primary Care at [facility] for f/u on HTN.  HTN See last OV 3/7. Verified with home meter. Restarted lisinopril-HCTZ 10-12.5 mg. Continued amlodipine 7.5 mg. He has been on anywhere from 5-7.5 mg of amlodipine. - Pt has been checking his BP at home with morning systolic readings in the 740C, occasionally 153 at night. Denies leg swelling, lightheadedness, dizziness, cp, sob. He has noticed that it takes him 1.5+ hours to mow his yard due to fatigue. H/o stroke at age 68. States he has cut back on salt intake since stroke. BP Readings from Last 3 Encounters:  05/16/17 (!) 142/88  04/04/17 (!) 168/112  03/05/17 130/88   Patient Active Problem List   Diagnosis Date Noted  . Diverticulosis of colon (without mention of hemorrhage) 04/06/2011  . Benign neoplasm of colon 04/06/2011  . Diverticulosis of colon with hemorrhage 04/06/2011  . Diaphragmatic hernia without mention of obstruction or gangrene 04/06/2011  . Hyperglycemia 04/05/2011  . Syncope and collapse 04/05/2011  . Ankle fracture, lateral malleolus, closed 04/05/2011  . Acute posthemorrhagic anemia 04/05/2011  . Blood in stool 04/05/2011  . Acute GI bleeding 04/05/2011   Past Medical History:  Diagnosis Date  . Gastric ulcer    secondary to nsaid 10 years ago  . GI bleed 04/04/2011  . Hip pain, chronic    right  . HLD (hyperlipidemia)   . Hypertension   . Obesity (BMI 35.0-39.9 without comorbidity) 03/2011  . Stroke [facility])    2005   Past Surgical History:  Procedure  Laterality Date  . TONSILLECTOMY     No Known Allergies Prior to Admission medications   Medication Sig Start Date End Date Taking? Authorizing Provider  amLODipine (NORVASC) 5 MG tablet Take 1 tablet (5 mg total) by mouth daily. 03/05/17   Wendie Agreste, MD  lisinopril-hydrochlorothiazide (PRINZIDE,ZESTORETIC) 10-12.5 MG tablet Take 1 tablet by mouth daily. 04/04/17   Wendie Agreste, MD   Social History   Socioeconomic History  . Marital status: Married    Spouse name: Not on file  . Number of children: Not on file  . Years of education: Not on file  . Highest education level: Not on file  Occupational History  . Not on file  Social Needs  . Financial resource strain: Not on file  . Food insecurity:    Worry: Not on file    Inability: Not on file  . Transportation needs:    Medical: Not on file    Non-medical: Not on file  Tobacco Use  . Smoking status: Former Research scientist (life sciences)  . Smokeless tobacco: Never Used  Substance and Sexual Activity  . Alcohol use: Yes    Comment: occasional  . Drug use: No  . Sexual activity: Not Currently  Lifestyle  . Physical activity:    Days per week: Not on file    Minutes per session: Not on file  . Stress: Not on file  Relationships  . Social connections:    Talks  on phone: Not on file    Gets together: Not on file    Attends religious service: Not on file    Active member of club or organization: Not on file    Attends meetings of clubs or organizations: Not on file    Relationship status: Not on file  . Intimate partner violence:    Fear of current or ex partner: Not on file    Emotionally abused: Not on file    Physically abused: Not on file    Forced sexual activity: Not on file  Other Topics Concern  . Not on file  Social History Narrative   Lives in [place], [place] with his wife and son. 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 Drinks 2-3 beers once every few weeks. Smoking 2 years ago. Used to smoke 2-3  packs per day before that for past many years. Never did illicit drugs. Has United health care Medicare.   Review of Systems  Constitutional: Negative for fatigue and unexpected weight change.  Eyes: Negative for visual disturbance.  Respiratory: Negative for cough, chest tightness and shortness of breath.   Cardiovascular: Negative for chest pain, palpitations and leg swelling.  Gastrointestinal: Negative for abdominal pain and blood in stool.  Neurological: Negative for dizziness, light-headedness and headaches.      Objective:   Physical Exam  Constitutional: He is oriented to person, place, and time. He appears well-developed and well-nourished.  HENT:  Head: Normocephalic and atraumatic.  Eyes: Pupils are equal, round, and reactive to light. EOM are normal.  Neck: No JVD present. Carotid bruit is not present.  Cardiovascular: Normal rate, regular rhythm and normal heart sounds.  No murmur heard. Pulmonary/Chest: Effort normal and breath sounds normal. He has no rales.  Musculoskeletal: He exhibits no edema.  Neurological: He is alert and oriented to person, place, and time.  Skin: Skin is warm and dry.  Psychiatric: He has a normal mood and affect.  Vitals reviewed.  Vitals:   05/16/17 1010 05/16/17 1037  BP: (!) 154/92 (!) 142/88  Pulse: 85   Temp: 98.8 F (37.1 C)   TempSrc: Oral   SpO2: 94%   Weight: 212 lb 9.6 oz (96.4 kg)   Height: 5\' 6"  (1.676 m)       Assessment & Plan:   Steven Vargas is a 82 y.o. male Essential hypertension - Plan: Basic metabolic panel, amLODipine (NORVASC) 10 MG tablet  -Improved, but not quite at goal for his history of stroke although that was many years ago.  Would like to see him in the 130s to 140 maximum, and reports some 150s at home currently.  -Increase amlodipine to 10 mg daily, continue lisinopril HCTZ same dose, check BMP with recent start of lisinopril HCTZ.  Recheck in 2 months, sooner if needed.  -Stressed importance of rest  with physical activity and maintain hydration. Meds ordered this encounter  Medications  . amLODipine (NORVASC) 10 MG tablet    Sig: Take 1 tablet (10 mg total) by mouth daily.    Dispense:  90 tablet    Refill:  1   Patient Instructions    Blood pressure looks better today, but would like it just a little bit lower.  I changed the amlodipine to 10 mg pill once per day.  Continue the lisinopril HCTZ combination pill at the same dose.  You will now be on 2 pills (with 3 total medicines).  If any lightheadedness, dizziness, or new side  effects at the new dose of medication, return right away.  Otherwise follow-up in the next 1 to 2 months and can recheck blood sugar at that time along with blood pressure.  When you are outside mowing the lawn, take a break every 30 minutes and make sure you rest and drink water.  If any chest pains, lightheadedness or dizziness with exercise, return right away.  Thank you for coming in today.   IF you received an x-ray today, you will receive an invoice from [facility]. Please contact [facility] Radiology at [phone] with questions or concerns regarding your invoice.   IF you received labwork today, you will receive an invoice from [place]. Please contact [facility] at [phone] with questions or concerns regarding your invoice.   Our billing staff will not be able to assist you with questions regarding bills from these companies.  You will be contacted with the lab results as soon as they are available. The fastest way to get your results is to activate your My Chart account. Instructions are located on the last page of this paperwork. If you have not heard from [place] regarding the results in 2 weeks, please contact this office.        I personally performed the services described in this documentation, which was scribed in my presence. The recorded information has been reviewed and considered for accuracy and completeness, addended by me  as needed, and agree with information above.  Signed,   [name], MD Primary Care at [place].  05/16/17 10:45 AM

## 2017-05-16 NOTE — Patient Instructions (Addendum)
  Blood pressure looks better today, but would like it just a little bit lower.  I changed the amlodipine to 10 mg pill once per day.  Continue the lisinopril HCTZ combination pill at the same dose.  You will now be on 2 pills (with 3 total medicines).  If any lightheadedness, dizziness, or new side effects at the new dose of medication, return right away.  Otherwise follow-up in the next 1 to 2 months and can recheck blood sugar at that time along with blood pressure.  When you are outside mowing the lawn, take a break every 30 minutes and make sure you rest and drink water.  If any chest pains, lightheadedness or dizziness with exercise, return right away.  Thank you for coming in today.   IF you received an x-ray today, you will receive an invoice from [facility]. Please contact [facility]iology at [phone] with questions or concerns regarding your invoice.   IF you received labwork today, you will receive an invoice from [facility]. Please contact [facility] at [phone] with questions or concerns regarding your invoice.   Our billing staff will not be able to assist you with questions regarding bills from these companies.  You will be contacted with the lab results as soon as they are available. The fastest way to get your results is to activate your My Chart account. Instructions are located on the last page of this paperwork. If you have not heard from [place] regarding the results in 2 weeks, please contact this office.

## 2017-05-17 LAB — BASIC METABOLIC PANEL
BUN/Creatinine Ratio: 17 (ref 10–24)
BUN: 19 mg/dL (ref 8–27)
CALCIUM: 9.2 mg/dL (ref 8.6–10.2)
CO2: 28 mmol/L (ref 20–29)
Chloride: 102 mmol/L (ref 96–106)
Creatinine, Ser: 1.09 mg/dL (ref 0.76–1.27)
GFR calc Af Amer: 74 mL/min/{1.73_m2} (ref >59)
GFR calc non Af Amer: 64 mL/min/{1.73_m2} (ref >59)
GLUCOSE: 108 mg/dL — AB (ref 65–99)
Potassium: 3.6 mmol/L (ref 3.5–5.2)
Sodium: 143 mmol/L (ref 134–144)

## 2017-06-03 ENCOUNTER — Encounter: Payer: Self-pay | Admitting: *Deleted

## 2017-07-18 ENCOUNTER — Ambulatory Visit: Payer: Medicare Other | Admitting: Family Medicine

## 2017-09-14 ENCOUNTER — Other Ambulatory Visit: Payer: Self-pay | Admitting: Family Medicine

## 2017-09-14 DIAGNOSIS — I10 Essential (primary) hypertension: Secondary | ICD-10-CM

## 2017-09-16 NOTE — Telephone Encounter (Signed)
amlodipine refill Last Refill:05/16/17 # 90 1 RF Last OV: 05/06/17 PCP: Dr Carlota Raspberry Pharmacy:[facility] [phone] [address]  Harley Alto refill Last Refill:04/04/17 # 9 1 RF Last OV: 04/04/17

## 2017-10-17 ENCOUNTER — Other Ambulatory Visit: Payer: Self-pay

## 2017-10-17 ENCOUNTER — Encounter: Payer: Self-pay | Admitting: Family Medicine

## 2017-10-17 ENCOUNTER — Ambulatory Visit: Payer: Medicare Other | Admitting: Family Medicine

## 2017-10-17 VITALS — BP 129/83 | HR 104 | Temp 97.8°F | Ht 66.0 in | Wt 206.2 lb

## 2017-10-17 DIAGNOSIS — Z23 Encounter for immunization: Secondary | ICD-10-CM | POA: Diagnosis not present

## 2017-10-17 DIAGNOSIS — R7303 Prediabetes: Secondary | ICD-10-CM

## 2017-10-17 DIAGNOSIS — I10 Essential (primary) hypertension: Secondary | ICD-10-CM

## 2017-10-17 DIAGNOSIS — E785 Hyperlipidemia, unspecified: Secondary | ICD-10-CM | POA: Diagnosis not present

## 2017-10-17 MED ORDER — AMLODIPINE BESYLATE 10 MG PO TABS: 10.0000 mg | ORAL_TABLET | Freq: Every day | ORAL | 2 refills | Status: DC

## 2017-10-17 MED ORDER — LISINOPRIL-HYDROCHLOROTHIAZIDE 10-12.5 MG PO TABS: 1.0000 | ORAL_TABLET | Freq: Every day | ORAL | 2 refills | Status: DC

## 2017-10-17 NOTE — Progress Notes (Signed)
Subjective:  By signing my name below, I, Moises Blood, attest that this documentation has been prepared under the direction and in the presence of [name], MD. Electronically Signed: Moises Blood, [place]. 10/17/2017 , 11:45 AM .  Patient was seen in Room 2 .   Patient ID: Steven Vargas, male    DOB: 1935-09-16, 82 y.o.   MRN: [id number] Chief Complaint  Patient presents with  . Diabetes    f/u  . Hypertension    Med refill (he dropped some in the sink by accident)   HPI Steven Vargas is a 82 y.o. male Here for follow up.   Pre-Diabetes Lab Results  Component Value Date   HGBA1C 6.0 (H) 03/05/2017   He's diet controlled; not taking any medications for this.   Wt Readings from Last 3 Encounters:  10/17/17 206 lb 3.2 oz (93.5 kg)  05/16/17 212 lb 9.6 oz (96.4 kg)  04/04/17 213 lb 6.4 oz (96.8 kg)    HTN BP Readings from Last 3 Encounters:  10/17/17 129/83  05/16/17 (!) 142/88  04/04/17 (!) 168/112   Lab Results  Component Value Date   CREATININE 1.09 05/16/2017   He takes Amlodipine 10 mg and Lisinopril-HCTZ 10-12.5 mg qd.   Home BP readings: most recently high of 154/98 but usually running in the 120s-130s/80s-90s. When he was on amlodipine 10 mg qd and Lisinopril-HCTZ 10-12.5 mg qd, his August readings ranged from 108/77 up to 151/94. He spilled some Lisinopril-HCTZ into his sink by accident about a month ago, and switched to "3 pills" unsure which medications. He denies chest pain, shortness of breath, lightheadedness, or dizziness. When he cuts the grass now with current regime, he doesn't feel as fatigued. He used to have to stop after 15 minutes feeling fatigue, but now able to continue for about an hour without difficulties.   Hyperlipidemia He's currently not on medications.   Lab Results  Component Value Date   CHOL 218 (H) 10/18/2016   HDL 61 10/18/2016   LDLCALC 132 (H) 10/18/2016   TRIG 126 10/18/2016   CHOLHDL 3.6 10/18/2016   Lab Results    Component Value Date   ALT 15 03/05/2017   AST 16 03/05/2017   ALKPHOS 75 03/05/2017   BILITOT 0.5 03/05/2017    Health maintenance He agrees to flu vaccine and pneumovax today. Prevnar received in March 2018.    Patient Active Problem List   Diagnosis Date Noted  . Diverticulosis of colon (without mention of hemorrhage) 04/06/2011  . Benign neoplasm of colon 04/06/2011  . Diverticulosis of colon with hemorrhage 04/06/2011  . Diaphragmatic hernia without mention of obstruction or gangrene 04/06/2011  . Hyperglycemia 04/05/2011  . Syncope and collapse 04/05/2011  . Ankle fracture, lateral malleolus, closed 04/05/2011  . Acute posthemorrhagic anemia 04/05/2011  . Blood in stool 04/05/2011  . Acute GI bleeding 04/05/2011   Past Medical History:  Diagnosis Date  . Gastric ulcer    secondary to nsaid 10 years ago  . GI bleed 04/04/2011  . Hip pain, chronic    right  . HLD (hyperlipidemia)   . Hypertension   . Obesity (BMI 35.0-39.9 without comorbidity) 03/2011  . Stroke [facility])    2005   Past Surgical History:  Procedure Laterality Date  . TONSILLECTOMY     No Known Allergies Prior to Admission medications   Medication Sig Start Date End Date Taking? Authorizing Provider  amLODipine (NORVASC) 10 MG tablet TAKE 1 TABLET BY MOUTH  DAILY 09/16/17  Wendie Agreste, MD  lisinopril-hydrochlorothiazide (PRINZIDE,ZESTORETIC) 10-12.5 MG tablet Take 1 tablet by mouth daily. 04/04/17   Wendie Agreste, MD  lisinopril-hydrochlorothiazide (PRINZIDE,ZESTORETIC) 10-12.5 MG tablet TAKE 1 TABLET BY MOUTH  DAILY 09/16/17   Wendie Agreste, MD   Social History   Socioeconomic History  . Marital status: Married    Spouse name: Not on file  . Number of children: Not on file  . Years of education: Not on file  . Highest education level: Not on file  Occupational History  . Not on file  Social Needs  . Financial resource strain: Not on file  . Food insecurity:    Worry: Not on file     Inability: Not on file  . Transportation needs:    Medical: Not on file    Non-medical: Not on file  Tobacco Use  . Smoking status: Former Research scientist (life sciences)  . Smokeless tobacco: Never Used  Substance and Sexual Activity  . Alcohol use: Yes    Comment: occasional  . Drug use: No  . Sexual activity: Not Currently  Lifestyle  . Physical activity:    Days per week: Not on file    Minutes per session: Not on file  . Stress: Not on file  Relationships  . Social connections:    Talks on phone: Not on file    Gets together: Not on file    Attends religious service: Not on file    Active member of club or organization: Not on file    Attends meetings of clubs or organizations: Not on file    Relationship status: Not on file  . Intimate partner violence:    Fear of current or ex partner: Not on file    Emotionally abused: Not on file    Physically abused: Not on file    Forced sexual activity: Not on file  Other Topics Concern  . Not on file  Social History Narrative   Lives in [place], [place] with his wife and son. 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 Drinks 2-3 beers once every few weeks. Smoking 2 years ago. Used to smoke 2-3 packs per day before that for past many years. Never did illicit drugs. Has United health care Medicare.   Review of Systems  Constitutional: Negative for fatigue and unexpected weight change.  Eyes: Negative for visual disturbance.  Respiratory: Negative for cough, chest tightness and shortness of breath.   Cardiovascular: Negative for chest pain, palpitations and leg swelling.  Gastrointestinal: Negative for abdominal pain and blood in stool.  Neurological: Negative for dizziness, light-headedness and headaches.       Objective:   Physical Exam  Constitutional: He is oriented to person, place, and time. He appears well-developed and well-nourished.  HENT:  Head: Normocephalic and atraumatic.  Eyes: Pupils are equal, round, and  reactive to light. EOM are normal.  Neck: No JVD present. Carotid bruit is not present.  Cardiovascular: Normal rate, regular rhythm and normal heart sounds.  No murmur heard. Pulmonary/Chest: Effort normal and breath sounds normal. He has no rales.  Abdominal: There is no tenderness.  Musculoskeletal: He exhibits no edema.  Neurological: He is alert and oriented to person, place, and time.  Skin: Skin is warm and dry.  Psychiatric: He has a normal mood and affect.  Vitals reviewed.   Vitals:   10/17/17 1055  BP: 129/83  Pulse: (!) 104  Temp: 97.8 F (36.6 C)  TempSrc: Oral  SpO2: 95%  Weight: 206 lb 3.2 oz (93.5 kg)  Height: 5\' 6"  (1.676 m)       Assessment & Plan:    Steven Vargas is a 82 y.o. male Essential hypertension - Plan: amLODipine (NORVASC) 10 MG tablet, lisinopril-hydrochlorothiazide (PRINZIDE,ZESTORETIC) 10-12.5 MG tablet  -Appears to overall be stable on current regimen of amlodipine and lisinopril HCTZ combo.  Continue same dose.  Check labs.  Hyperlipidemia, unspecified hyperlipidemia type - Plan: Comprehensive metabolic panel, Lipid panel  -Check labs, consider ASCVD risk but at age 4 discuss risks and benefits with statins.  Prediabetes - Plan: Hemoglobin A1c  -Previously overall stable, recheck A1c.  Flu vaccine need - Plan: Flu vaccine HIGH DOSE PF (Fluzone High dose)  Need for prophylactic vaccination against Streptococcus pneumoniae (pneumococcus) - Plan: Pneumococcal polysaccharide vaccine 23-valent greater than or equal to 2yo subcutaneous/IM given   Meds ordered this encounter  Medications  . amLODipine (NORVASC) 10 MG tablet    Sig: Take 1 tablet (10 mg total) by mouth daily.    Dispense:  90 tablet    Refill:  2  . lisinopril-hydrochlorothiazide (PRINZIDE,ZESTORETIC) 10-12.5 MG tablet    Sig: Take 1 tablet by mouth daily.    Dispense:  90 tablet    Refill:  2   Patient Instructions     Continue the amlodipine 10 mg once per day and  the lisinopril HCTZ combo pill once per day.  If your home readings are staying in the 140s to 150s or above 90 consistently come see me so we can change those medications.   I will check your cholesterol again today and we can determine if medication is needed.   I am also going to check your diabetes test today.   Flu vaccine and pneumonia vaccines will be given today.   Follow-up in 6 months and we can recheck some lab work at that time.    How to Take Your Blood Pressure You can take your blood pressure at home with a machine. You may need to check your blood pressure at home:  To check if you have high blood pressure (hypertension).  To check your blood pressure over time.  To make sure your blood pressure medicine is working.  Supplies needed: You will need a blood pressure machine, or monitor. You can buy one at a drugstore or online. When choosing one:  Choose one with an arm cuff.  Choose one that wraps around your upper arm. Only one finger should fit between your arm and the cuff.  Do not choose one that measures your blood pressure from your wrist or finger.  Your doctor can suggest a monitor. How to prepare Avoid these things for 30 minutes before checking your blood pressure:  Drinking caffeine.  Drinking alcohol.  Eating.  Smoking.  Exercising.  Five minutes before checking your blood pressure:  Pee.  Sit in a dining chair. Avoid sitting in a soft couch or armchair.  Be quiet. Do not talk.  How to take your blood pressure Follow the instructions that came with your machine. If you have a digital blood pressure monitor, these may be the instructions: 1. Sit up straight. 2. Place your feet on the floor. Do not cross your ankles or legs. 3. Rest your left arm at the level of your heart. You may rest it on a table, desk, or chair. 4. Pull up your shirt sleeve. 5. Wrap the blood pressure cuff around the upper part of your left arm. The cuff should  be  1 inch (2.5 cm) above your elbow. It is best to wrap the cuff around bare skin. 6. Fit the cuff snugly around your arm. You should be able to place only one finger between the cuff and your arm. 7. Put the cord inside the groove of your elbow. 8. Press the power button. 9. Sit quietly while the cuff fills with air and loses air. 10. Write down the numbers on the screen. 11. Wait 2-3 minutes and then repeat steps 1-10.  What do the numbers mean? Two numbers make up your blood pressure. The first number is called systolic pressure. The second is called diastolic pressure. An example of a blood pressure reading is "120 over 80" (or 120/80). If you are an adult and do not have a medical condition, use this guide to find out if your blood pressure is normal: Normal  First number: below 120.  Second number: below 80. Elevated  First number: 120-129.  Second number: below 80. Hypertension stage 1  First number: 130-139.  Second number: 80-89. Hypertension stage 2  First number: 140 or above.  Second number: 84 or above. Your blood pressure is above normal even if only the top or bottom number is above normal. Follow these instructions at home:  Check your blood pressure as often as your doctor tells you to.  Take your monitor to your next doctor's appointment. Your doctor will: ? Make sure you are using it correctly. ? Make sure it is working right.  Make sure you understand what your blood pressure numbers should be.  Tell your doctor if your medicines are causing side effects. Contact a doctor if:  Your blood pressure keeps being high. Get help right away if:  Your first blood pressure number is higher than 180.  Your second blood pressure number is higher than 120. This information is not intended to replace advice given to you by your health care provider. Make sure you discuss any questions you have with your health care provider. Document Released: 12/29/2007  Document Revised: 12/14/2015 Document Reviewed: 06/24/2015 Elsevier Interactive Patient Education  [employer].   If you have lab work done today you will be contacted with your lab results within the next 2 weeks.  If you have not heard from [place] then please contact us. The fastest way to get your results is to register for My Chart.   IF you received an x-ray today, you will receive an invoice from [facility]. Please contact [facility] Radiology at [phone] with questions or concerns regarding your invoice.   IF you received labwork today, you will receive an invoice from [facility]. Please contact [facility] at [phone] with questions or concerns regarding your invoice.   Our billing staff will not be able to assist you with questions regarding bills from these companies.  You will be contacted with the lab results as soon as they are available. The fastest way to get your results is to activate your My Chart account. Instructions are located on the last page of this paperwork. If you have not heard from [place] regarding the results in 2 weeks, please contact this office.       I personally performed the services described in this documentation, which was scribed in my presence. The recorded information has been reviewed and considered for accuracy and completeness, addended by me as needed, and agree with information above.  Signed,   [name], MD Primary Care at [place].  10/17/17 10:31 PM

## 2017-10-17 NOTE — Patient Instructions (Addendum)
Continue the amlodipine 10 mg once per day and the lisinopril HCTZ combo pill once per day.  If your home readings are staying in the 140s to 150s or above 90 consistently come see me so we can change those medications.   I will check your cholesterol again today and we can determine if medication is needed.   I am also going to check your diabetes test today.   Flu vaccine and pneumonia vaccines will be given today.   Follow-up in 6 months and we can recheck some lab work at that time.    How to Take Your Blood Pressure You can take your blood pressure at home with a machine. You may need to check your blood pressure at home:  To check if you have high blood pressure (hypertension).  To check your blood pressure over time.  To make sure your blood pressure medicine is working.  Supplies needed: You will need a blood pressure machine, or monitor. You can buy one at a drugstore or online. When choosing one:  Choose one with an arm cuff.  Choose one that wraps around your upper arm. Only one finger should fit between your arm and the cuff.  Do not choose one that measures your blood pressure from your wrist or finger.  Your doctor can suggest a monitor. How to prepare Avoid these things for 30 minutes before checking your blood pressure:  Drinking caffeine.  Drinking alcohol.  Eating.  Smoking.  Exercising.  Five minutes before checking your blood pressure:  Pee.  Sit in a dining chair. Avoid sitting in a soft couch or armchair.  Be quiet. Do not talk.  How to take your blood pressure Follow the instructions that came with your machine. If you have a digital blood pressure monitor, these may be the instructions: 1. Sit up straight. 2. Place your feet on the floor. Do not cross your ankles or legs. 3. Rest your left arm at the level of your heart. You may rest it on a table, desk, or chair. 4. Pull up your shirt sleeve. 5. Wrap the blood pressure cuff around  the upper part of your left arm. The cuff should be 1 inch (2.5 cm) above your elbow. It is best to wrap the cuff around bare skin. 6. Fit the cuff snugly around your arm. You should be able to place only one finger between the cuff and your arm. 7. Put the cord inside the groove of your elbow. 8. Press the power button. 9. Sit quietly while the cuff fills with air and loses air. 10. Write down the numbers on the screen. 11. Wait 2-3 minutes and then repeat steps 1-10.  What do the numbers mean? Two numbers make up your blood pressure. The first number is called systolic pressure. The second is called diastolic pressure. An example of a blood pressure reading is "120 over 80" (or 120/80). If you are an adult and do not have a medical condition, use this guide to find out if your blood pressure is normal: Normal  First number: below 120.  Second number: below 80. Elevated  First number: 120-129.  Second number: below 80. Hypertension stage 1  First number: 130-139.  Second number: 80-89. Hypertension stage 2  First number: 140 or above.  Second number: 68 or above. Your blood pressure is above normal even if only the top or bottom number is above normal. Follow these instructions at home:  Check your blood pressure as  often as your doctor tells you to.  Take your monitor to your next doctor's appointment. Your doctor will: ? Make sure you are using it correctly. ? Make sure it is working right.  Make sure you understand what your blood pressure numbers should be.  Tell your doctor if your medicines are causing side effects. Contact a doctor if:  Your blood pressure keeps being high. Get help right away if:  Your first blood pressure number is higher than 180.  Your second blood pressure number is higher than 120. This information is not intended to replace advice given to you by your health care provider. Make sure you discuss any questions you have with your health  care provider. Document Released: 12/29/2007 Document Revised: 12/14/2015 Document Reviewed: 06/24/2015 Elsevier Interactive Patient Education  [employer].   If you have lab work done today you will be contacted with your lab results within the next 2 weeks.  If you have not heard from [place] then please contact us. The fastest way to get your results is to register for My Chart.   IF you received an x-ray today, you will receive an invoice from [facility]. Please contact [facility] at [phone] with questions or concerns regarding your invoice.   IF you received labwork today, you will receive an invoice from [place]. Please contact [facility] at [phone] with questions or concerns regarding your invoice.   Our billing staff will not be able to assist you with questions regarding bills from these companies.  You will be contacted with the lab results as soon as they are available. The fastest way to get your results is to activate your My Chart account. Instructions are located on the last page of this paperwork. If you have not heard from [place] regarding the results in 2 weeks, please contact this office.

## 2017-10-18 LAB — COMPREHENSIVE METABOLIC PANEL
ALBUMIN: 4.6 g/dL (ref 3.5–4.7)
ALT: 26 IU/L (ref 0–44)
AST: 19 IU/L (ref 0–40)
Albumin/Globulin Ratio: 1.8 (ref 1.2–2.2)
Alkaline Phosphatase: 88 IU/L (ref 39–117)
BUN / CREAT RATIO: 14 (ref 10–24)
BUN: 15 mg/dL (ref 8–27)
Bilirubin Total: 0.5 mg/dL (ref 0.0–1.2)
CALCIUM: 9.4 mg/dL (ref 8.6–10.2)
CO2: 21 mmol/L (ref 20–29)
Chloride: 103 mmol/L (ref 96–106)
Creatinine, Ser: 1.06 mg/dL (ref 0.76–1.27)
GFR calc Af Amer: 76 mL/min/{1.73_m2} (ref >59)
GFR, EST NON AFRICAN AMERICAN: 66 mL/min/{1.73_m2} (ref >59)
GLUCOSE: 106 mg/dL — AB (ref 65–99)
Globulin, Total: 2.5 g/dL (ref 1.5–4.5)
Potassium: 3.7 mmol/L (ref 3.5–5.2)
Sodium: 144 mmol/L (ref 134–144)
Total Protein: 7.1 g/dL (ref 6.0–8.5)

## 2017-10-18 LAB — LIPID PANEL
CHOL/HDL RATIO: 3.3 ratio (ref 0.0–5.0)
Cholesterol, Total: 221 mg/dL — ABNORMAL HIGH (ref 100–199)
HDL: 68 mg/dL (ref >39)
LDL CALC: 128 mg/dL — AB (ref 0–99)
Triglycerides: 127 mg/dL (ref 0–149)
VLDL CHOLESTEROL CAL: 25 mg/dL (ref 5–40)

## 2017-10-18 LAB — HEMOGLOBIN A1C
Est. average glucose Bld gHb Est-mCnc: 128 mg/dL
HEMOGLOBIN A1C: 6.1 % — AB (ref 4.8–5.6)

## 2017-10-29 ENCOUNTER — Encounter: Payer: Self-pay | Admitting: *Deleted

## 2017-11-06 ENCOUNTER — Telehealth: Payer: Self-pay | Admitting: Family Medicine

## 2017-11-06 DIAGNOSIS — E785 Hyperlipidemia, unspecified: Secondary | ICD-10-CM

## 2017-11-06 NOTE — Telephone Encounter (Signed)
Copied from [place] [id number]. Topic: Quick Communication - See Telephone Encounter >> Nov 06, 2017 10:45 AM Sheran Luz wrote: CRM for notification. See Telephone encounter for: 11/06/17.  Pt called stating that at his last visit, Dr Carlota Raspberry recommended pt start a new blood pressure medication and to call office when/if pt wanted to start. Pt could not remember name of medication but would like to try it. Please advise.

## 2017-11-07 NOTE — Telephone Encounter (Signed)
Dr Greene please advise. Dgaddy, CMA 

## 2017-11-08 NOTE — Telephone Encounter (Signed)
More info please.  Readings looked okay last visit. Option of change in medications discussed if higher readings but recommend an office visit to discuss those changes.  Is he having higher readings or other issues with his medications ?    This was on his AVS last visit: "Continue the amlodipine 10 mg once per day and the lisinopril HCTZ combo pill once per day.  If your home readings are staying in the 140s to 150s or above 90 consistently come see me so we can change those medications."

## 2017-11-13 MED ORDER — PRAVASTATIN SODIUM 20 MG PO TABS: 20.0000 mg | ORAL_TABLET | Freq: Every day | ORAL | 1 refills | Status: DC

## 2017-11-13 NOTE — Telephone Encounter (Signed)
Spoke with pt.  Message incorrect.  Pt was calling to agree to take a statin for cholesterol. Pt is taking both HTN meds.  BP readings usually 098-286 systolic daily. Please call to [facility]

## 2017-11-13 NOTE — Telephone Encounter (Signed)
Thanks. Med sent.

## 2018-04-17 ENCOUNTER — Ambulatory Visit (INDEPENDENT_AMBULATORY_CARE_PROVIDER_SITE_OTHER): Payer: Medicare Other | Admitting: Family Medicine

## 2018-04-17 ENCOUNTER — Encounter: Payer: Self-pay | Admitting: Family Medicine

## 2018-04-17 ENCOUNTER — Other Ambulatory Visit: Payer: Self-pay

## 2018-04-17 VITALS — BP 130/82 | HR 104 | Temp 98.1°F | Resp 14 | Ht 60.0 in | Wt 209.8 lb

## 2018-04-17 DIAGNOSIS — I10 Essential (primary) hypertension: Secondary | ICD-10-CM | POA: Diagnosis not present

## 2018-04-17 DIAGNOSIS — R739 Hyperglycemia, unspecified: Secondary | ICD-10-CM | POA: Diagnosis not present

## 2018-04-17 DIAGNOSIS — E785 Hyperlipidemia, unspecified: Secondary | ICD-10-CM | POA: Diagnosis not present

## 2018-04-17 MED ORDER — LISINOPRIL-HYDROCHLOROTHIAZIDE 10-12.5 MG PO TABS: 1.0000 | ORAL_TABLET | Freq: Every day | ORAL | 2 refills | Status: DC

## 2018-04-17 MED ORDER — AMLODIPINE BESYLATE 10 MG PO TABS: 10.0000 mg | ORAL_TABLET | Freq: Every day | ORAL | 2 refills | Status: DC

## 2018-04-17 NOTE — Patient Instructions (Addendum)
   Will avoid statin medicine for now.  I will check cholesterol levels again today to determine if medication is needed.  If you have any return of lip swelling that can be associated with lisinopril so stop that medicine right away and let me know if that occurs.   Recheck in 6 months.   If you have lab work done today you will be contacted with your lab results within the next 2 weeks.  If you have not heard from [place] then please contact us. The fastest way to get your results is to register for My Chart.   IF you received an x-ray today, you will receive an invoice from [facility]. Please contact [facility] at [phone] with questions or concerns regarding your invoice.   IF you received labwork today, you will receive an invoice from [place]. Please contact [facility] at [phone] with questions or concerns regarding your invoice.   Our billing staff will not be able to assist you with questions regarding bills from these companies.  You will be contacted with the lab results as soon as they are available. The fastest way to get your results is to activate your My Chart account. Instructions are located on the last page of this paperwork. If you have not heard from [place] regarding the results in 2 weeks, please contact this office.

## 2018-04-17 NOTE — Progress Notes (Signed)
Subjective:    Patient ID: Steven Vargas, male    DOB: 03-Jan-1936, 83 y.o.   MRN: [id number]  HPI Steven Vargas is a 83 y.o. male Presents today for: Chief Complaint  Patient presents with  . Hypertension    last visit here 10/17/2017 in Oct he was put on Pravastain 20mg  but had to stop this medication due to it made my lips and jaw swell and back pain. So restarted the lisinopril/hctz 10-12.5 mg   . prediabetes    f/u last A1c on 10/17/17 was 6.1   Hypertension: BP Readings from Last 3 Encounters:  04/17/18 130/82  10/17/17 129/83  05/16/17 (!) 142/88   Lab Results  Component Value Date   CREATININE 1.06 10/17/2017  Continued on amlodipine 10 mg, lisinopril HCTZ 10/12.5 mg at last visit. Home readings: 120s to 130s primarily over 80s.  Rare 140, rare 683, on systolic.  Diastolics 41D to 62I on occasion.  Hyperlipidemia:  Lab Results  Component Value Date   CHOL 221 (H) 10/17/2017   HDL 68 10/17/2017   LDLCALC 128 (H) 10/17/2017   TRIG 127 10/17/2017   CHOLHDL 3.3 10/17/2017   Lab Results  Component Value Date   ALT 26 10/17/2017   AST 19 10/17/2017   ALKPHOS 88 10/17/2017   BILITOT 0.5 10/17/2017  The ASCVD Risk score Mikey Bussing DC Jr., et al., 2013) failed to calculate for the following reasons:   The 2013 ASCVD risk score is only valid for ages 57 to 74 Statin started in October, pravastatin 20 mg daily.  Felt like jaw swelling, R lip swelling, back pain after starting med for 2 days, then stopped. Symptoms resolved when stopped pravastatin.  No return of lip swelling or above symptoms.   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 No fast food.  No soda, sweet tea. Some green tea.    Patient Active Problem List   Diagnosis Date Noted  .  Diverticulosis of colon (without mention of hemorrhage) 04/06/2011  . Benign neoplasm of colon 04/06/2011  . Diverticulosis of colon with hemorrhage 04/06/2011  . Diaphragmatic hernia without mention of obstruction or gangrene 04/06/2011  . Hyperglycemia 04/05/2011  . Syncope and collapse 04/05/2011  . Ankle fracture, lateral malleolus, closed 04/05/2011  . Acute posthemorrhagic anemia 04/05/2011  . Blood in stool 04/05/2011  . Acute GI bleeding 04/05/2011   Past Medical History:  Diagnosis Date  . Gastric ulcer    secondary to nsaid 10 years ago  . GI bleed 04/04/2011  . Hip pain, chronic    right  . HLD (hyperlipidemia)   . Hypertension   . Obesity (BMI 35.0-39.9 without comorbidity) 03/2011  . Stroke [facility])    2005   Past Surgical History:  Procedure Laterality Date  . TONSILLECTOMY     No Known Allergies Prior to Admission medications   Medication Sig Start Date End Date Taking? Authorizing Provider  amLODipine (NORVASC) 10 MG tablet Take 1 tablet (10 mg total) by mouth daily. 10/17/17  Yes Wendie Agreste, MD  lisinopril-hydrochlorothiazide (PRINZIDE,ZESTORETIC) 10-12.5 MG tablet Take 1 tablet by mouth daily. 10/17/17  Yes Wendie Agreste, MD   Social History   Socioeconomic History  . Marital status: Married  Spouse name: Not on file  . Number of children: Not on file  . Years of education: Not on file  . Highest education level: Not on file  Occupational History  . Not on file  Social Needs  . Financial resource strain: Not on file  . Food insecurity:    Worry: Not on file    Inability: Not on file  . Transportation needs:    Medical: Not on file    Non-medical: Not on file  Tobacco Use  . Smoking status: Former Research scientist (life sciences)  . Smokeless tobacco: Never Used  Substance and Sexual Activity  . Alcohol use: Yes    Comment: occasional  . Drug use: No  . Sexual activity: Not Currently  Lifestyle  . Physical activity:    Days per week: Not on file    Minutes  per session: Not on file  . Stress: Not on file  Relationships  . Social connections:    Talks on phone: Not on file    Gets together: Not on file    Attends religious service: Not on file    Active member of club or organization: Not on file    Attends meetings of clubs or organizations: Not on file    Relationship status: Not on file  . Intimate partner violence:    Fear of current or ex partner: Not on file    Emotionally abused: Not on file    Physically abused: Not on file    Forced sexual activity: Not on file  Other Topics Concern  . Not on file  Social History Narrative   Lives in [place], [place] with his wife and son. 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 Drinks 2-3 beers once every few weeks. Smoking 2 years ago. Used to smoke 2-3 packs per day before that for past many years. Never did illicit drugs. Has [employer] health care Medicare.    Review of Systems  Constitutional: Negative for fatigue and unexpected weight change.  Eyes: Negative for visual disturbance.  Respiratory: Negative for cough, chest tightness and shortness of breath.   Cardiovascular: Negative for chest pain, palpitations and leg swelling.  Gastrointestinal: Negative for abdominal pain and blood in stool.  Neurological: Negative for dizziness, light-headedness and headaches.   Per [facility].      Objective:   Physical Exam Vitals signs reviewed.  Constitutional:      Appearance: He is well-developed.  HENT:     Head: Normocephalic and atraumatic.  Eyes:     Pupils: Pupils are equal, round, and reactive to light.  Neck:     Vascular: No carotid bruit or JVD.  Cardiovascular:     Rate and Rhythm: Normal rate and regular rhythm.     Heart sounds: Normal heart sounds. No murmur.  Pulmonary:     Effort: Pulmonary effort is normal.     Breath sounds: Normal breath sounds. No rales.  Skin:    General: Skin is warm and dry.  Neurological:     Mental Status: He is alert and  oriented to person, place, and time.    Vitals:   04/17/18 1133  BP: 130/82  Pulse: (!) 104  Resp: 14  Temp: 98.1 F (36.7 C)  TempSrc: Oral  SpO2: 96%  Weight: 209 lb 12.8 oz (95.2 kg)  Height: 5' (1.524 m)          Assessment & Plan:   Steven Vargas is a 83 y.o. male Hyperglycemia - Plan:  Hemoglobin A1c  -Watch diet, activity, check A1c.  Essential hypertension - Plan: amLODipine (NORVASC) 10 MG tablet, lisinopril-hydrochlorothiazide (PRINZIDE,ZESTORETIC) 10-12.5 MG tablet, Comprehensive metabolic panel  -Stable on current regimen.  History as above episode of lip swelling but noted with statin.  Would typically be more concerned with ACE inhibitor in the symptoms, but only isolated event and has been on ACE since that time without difficulty.  Cautioned if any recurrent symptoms will need to stop lisinopril and change to different med.  Understanding expressed.  No changes for now  Hyperlipidemia, unspecified hyperlipidemia type - Plan: Lipid Panel  -Possible reaction to statin as above.  Hold on statin at this time, check lipid panel.  Based on age would likely not repeat statin given potential prior reaction  Meds ordered this encounter  Medications  . amLODipine (NORVASC) 10 MG tablet    Sig: Take 1 tablet (10 mg total) by mouth daily.    Dispense:  90 tablet    Refill:  2  . lisinopril-hydrochlorothiazide (PRINZIDE,ZESTORETIC) 10-12.5 MG tablet    Sig: Take 1 tablet by mouth daily.    Dispense:  90 tablet    Refill:  2   Patient Instructions     Will avoid statin medicine for now.  I will check cholesterol levels again today to determine if medication is needed.  If you have any return of lip swelling that can be associated with lisinopril so stop that medicine right away and let me know if that occurs.   Recheck in 6 months.   If you have lab work done today you will be contacted with your lab results within the next 2 weeks.  If you have not heard from [place]  then please contact us. The fastest way to get your results is to register for My Chart.   IF you received an x-ray today, you will receive an invoice from [facility]. Please contact [facility] at [phone] with questions or concerns regarding your invoice.   IF you received labwork today, you will receive an invoice from [place]. Please contact [facility] at [phone] with questions or concerns regarding your invoice.   Our billing staff will not be able to assist you with questions regarding bills from these companies.  You will be contacted with the lab results as soon as they are available. The fastest way to get your results is to activate your My Chart account. Instructions are located on the last page of this paperwork. If you have not heard from [place] regarding the results in 2 weeks, please contact this office.       Signed,   [name], MD [facility].  04/20/18 11:46 AM

## 2018-04-18 LAB — LIPID PANEL
Chol/HDL Ratio: 3.6 ratio (ref 0.0–5.0)
Cholesterol, Total: 214 mg/dL — ABNORMAL HIGH (ref 100–199)
HDL: 60 mg/dL (ref >39)
LDL Calculated: 131 mg/dL — ABNORMAL HIGH (ref 0–99)
Triglycerides: 117 mg/dL (ref 0–149)
VLDL Cholesterol Cal: 23 mg/dL (ref 5–40)

## 2018-04-18 LAB — COMPREHENSIVE METABOLIC PANEL
ALT: 18 IU/L (ref 0–44)
AST: 17 IU/L (ref 0–40)
Albumin/Globulin Ratio: 1.5 (ref 1.2–2.2)
Albumin: 4.4 g/dL (ref 3.7–4.7)
Alkaline Phosphatase: 79 IU/L (ref 39–117)
BUN/Creatinine Ratio: 17 (ref 10–24)
BUN: 19 mg/dL (ref 8–27)
Bilirubin Total: 0.3 mg/dL (ref 0.0–1.2)
CO2: 24 mmol/L (ref 20–29)
CREATININE: 1.1 mg/dL (ref 0.76–1.27)
Calcium: 9.7 mg/dL (ref 8.6–10.2)
Chloride: 102 mmol/L (ref 96–106)
GFR calc Af Amer: 73 mL/min/{1.73_m2} (ref >59)
GFR calc non Af Amer: 63 mL/min/{1.73_m2} (ref >59)
GLOBULIN, TOTAL: 2.9 g/dL (ref 1.5–4.5)
Glucose: 113 mg/dL — ABNORMAL HIGH (ref 65–99)
Potassium: 4.4 mmol/L (ref 3.5–5.2)
SODIUM: 143 mmol/L (ref 134–144)
Total Protein: 7.3 g/dL (ref 6.0–8.5)

## 2018-04-18 LAB — HEMOGLOBIN A1C
Est. average glucose Bld gHb Est-mCnc: 131 mg/dL
HEMOGLOBIN A1C: 6.2 % — AB (ref 4.8–5.6)

## 2018-04-21 ENCOUNTER — Encounter: Payer: Self-pay | Admitting: *Deleted

## 2018-08-26 ENCOUNTER — Telehealth: Payer: Self-pay | Admitting: *Deleted

## 2018-08-26 NOTE — Telephone Encounter (Signed)
Schedule AWV.  

## 2018-09-05 ENCOUNTER — Telehealth: Payer: Self-pay | Admitting: Family Medicine

## 2018-09-05 NOTE — Telephone Encounter (Signed)
Pt called back in regards to scheduling AWV. But pt states he is not interested in it.

## 2018-10-20 ENCOUNTER — Ambulatory Visit (INDEPENDENT_AMBULATORY_CARE_PROVIDER_SITE_OTHER): Payer: Medicare Other | Admitting: Family Medicine

## 2018-10-20 ENCOUNTER — Encounter: Payer: Self-pay | Admitting: Family Medicine

## 2018-10-20 ENCOUNTER — Other Ambulatory Visit: Payer: Self-pay

## 2018-10-20 VITALS — BP 129/78 | HR 100 | Temp 98.8°F | Wt 204.4 lb

## 2018-10-20 DIAGNOSIS — Z23 Encounter for immunization: Secondary | ICD-10-CM | POA: Diagnosis not present

## 2018-10-20 DIAGNOSIS — R7303 Prediabetes: Secondary | ICD-10-CM

## 2018-10-20 DIAGNOSIS — I10 Essential (primary) hypertension: Secondary | ICD-10-CM

## 2018-10-20 DIAGNOSIS — E785 Hyperlipidemia, unspecified: Secondary | ICD-10-CM | POA: Diagnosis not present

## 2018-10-20 MED ORDER — AMLODIPINE BESYLATE 10 MG PO TABS: 10.0000 mg | ORAL_TABLET | Freq: Every day | ORAL | 2 refills | Status: DC

## 2018-10-20 MED ORDER — LISINOPRIL-HYDROCHLOROTHIAZIDE 10-12.5 MG PO TABS: 1.0000 | ORAL_TABLET | Freq: Every day | ORAL | 2 refills | Status: DC

## 2018-10-20 NOTE — Patient Instructions (Addendum)
   No med changes. Keep up the good work as weight is a little better today. Watch portion sizes, but no other new diet recommendations for now. Thanks for coming in today and take care!  If you have lab work done today you will be contacted with your lab results within the next 2 weeks.  If you have not heard from [place] then please contact us. The fastest way to get your results is to register for My Chart.   IF you received an x-ray today, you will receive an invoice from [facility]. Please contact [facility] at [phone] with questions or concerns regarding your invoice.   IF you received labwork today, you will receive an invoice from [place]. Please contact [facility] at [phone] with questions or concerns regarding your invoice.   Our billing staff will not be able to assist you with questions regarding bills from these companies.  You will be contacted with the lab results as soon as they are available. The fastest way to get your results is to activate your My Chart account. Instructions are located on the last page of this paperwork. If you have not heard from [place] regarding the results in 2 weeks, please contact this office.

## 2018-10-20 NOTE — Progress Notes (Signed)
Subjective:    Patient ID: Steven Vargas, male    DOB: 1935-04-09, 83 y.o.   MRN: [id number]  HPI Steven Vargas is a 83 y.o. male Presents today for: Chief Complaint  Patient presents with  . Hypertension    6 month f/u on chronic medical conditions, labs, and med review. BP has been running average of 100/61-147/94   Hypertension: BP Readings from Last 3 Encounters:  10/20/18 129/78  04/17/18 130/82  10/17/17 129/83   Lab Results  Component Value Date   CREATININE 1.10 04/17/2018  Lisinopril HCTZ 10/12.5 mg daily, amlodipine 10 mg daily.  Overall is been controlled at home with readings of 100-147/61-94. No new side effects of meds. No face/lip swelling.   Prediabetes Weight has improved since last visit, not on medications. LDL 131 in March, but thought to have possible allergy to statin where he felt like his jaw and lips were swelling, back pain after starting medication for 2 days.  Symptoms resolved with stopping pravastatin.   Wt Readings from Last 3 Encounters:  10/20/18 204 lb 6.4 oz (92.7 kg)  04/17/18 209 lb 12.8 oz (95.2 kg)  10/17/17 206 lb 3.2 oz (93.5 kg)   Lab Results  Component Value Date   HGBA1C 6.2 (H) 04/17/2018   Exercise around the home - yardwork. cleaning around the home.     Patient Active Problem List   Diagnosis Date Noted  . Diverticulosis of colon (without mention of hemorrhage) 04/06/2011  . Benign neoplasm of colon 04/06/2011  . Diverticulosis of colon with hemorrhage 04/06/2011  . Diaphragmatic hernia without mention of obstruction or gangrene 04/06/2011  . Hyperglycemia 04/05/2011  . Syncope and collapse 04/05/2011  . Ankle fracture, lateral malleolus, closed 04/05/2011  . Acute posthemorrhagic anemia 04/05/2011  . Blood in stool 04/05/2011  . Acute GI bleeding 04/05/2011   Past Medical History:  Diagnosis Date  . Gastric ulcer    secondary to nsaid 10 years ago  . GI bleed 04/04/2011  . Hip pain, chronic    right  . HLD  (hyperlipidemia)   . Hypertension   . Obesity (BMI 35.0-39.9 without comorbidity) 03/2011  . Stroke [facility])    2005   Past Surgical History:  Procedure Laterality Date  . TONSILLECTOMY     No Known Allergies Prior to Admission medications   Medication Sig Start Date End Date Taking? Authorizing Provider  amLODipine (NORVASC) 10 MG tablet Take 1 tablet (10 mg total) by mouth daily. 04/17/18  Yes Wendie Agreste, MD  lisinopril-hydrochlorothiazide (PRINZIDE,ZESTORETIC) 10-12.5 MG tablet Take 1 tablet by mouth daily. 04/17/18  Yes Wendie Agreste, MD   Social History   Socioeconomic History  . Marital status: Married    Spouse name: Not on file  . Number of children: Not on file  . Years of education: Not on file  . Highest education level: Not on file  Occupational History  . Not on file  Social Needs  . Financial resource strain: Not on file  . Food insecurity    Worry: Not on file    Inability: Not on file  . Transportation needs    Medical: Not on file    Non-medical: Not on file  Tobacco Use  . Smoking status: Former Research scientist (life sciences)  . Smokeless tobacco: Never Used  Substance and Sexual Activity  . Alcohol use: Yes    Comment: occasional  . Drug use: No  . Sexual activity: Not Currently  Lifestyle  . Physical activity  Days per week: Not on file    Minutes per session: Not on file  . Stress: Not on file  Relationships  . Social Herbalist on phone: Not on file    Gets together: Not on file    Attends religious service: Not on file    Active member of club or organization: Not on file    Attends meetings of clubs or organizations: Not on file    Relationship status: Not on file  . Intimate partner violence    Fear of current or ex partner: Not on file    Emotionally abused: Not on file    Physically abused: Not on file    Forced sexual activity: Not on file  Other Topics Concern  . Not on file  Social History Narrative   Lives in [place], [place] with his wife and son. 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 Drinks 2-3 beers once every few weeks. Smoking 2 years ago. Used to smoke 2-3 packs per day before that for past many years. Never did illicit drugs. Has United health care Medicare.    Review of Systems  Constitutional: Negative for fatigue and unexpected weight change.  Eyes: Negative for visual disturbance.  Respiratory: Negative for cough, chest tightness and shortness of breath.   Cardiovascular: Negative for chest pain, palpitations and leg swelling.  Gastrointestinal: Negative for abdominal pain and blood in stool.  Neurological: Negative for dizziness, light-headedness and headaches.      Objective:   Physical Exam Vitals signs reviewed.  Constitutional:      Appearance: He is well-developed.     Comments: Overweight.   HENT:     Head: Normocephalic and atraumatic.  Eyes:     Pupils: Pupils are equal, round, and reactive to light.  Neck:     Vascular: No carotid bruit or JVD.  Cardiovascular:     Rate and Rhythm: Normal rate and regular rhythm.     Heart sounds: Normal heart sounds. No murmur.  Pulmonary:     Effort: Pulmonary effort is normal.     Breath sounds: Normal breath sounds. No rales.  Skin:    General: Skin is warm and dry.  Neurological:     Mental Status: He is alert and oriented to person, place, and time.    Vitals:   10/20/18 1017  BP: 129/78  Pulse: 100  Temp: 98.8 F (37.1 C)  TempSrc: Oral  SpO2: 96%  Weight: 204 lb 6.4 oz (92.7 kg)       Assessment & Plan:   Steven Vargas is a 83 y.o. male Essential hypertension - Plan: Comprehensive metabolic panel, lisinopril-hydrochlorothiazide (ZESTORETIC) 10-12.5 MG tablet, amLODipine (NORVASC) 10 MG tablet  - stable. No change in meds. Labs pending.   Hyperlipidemia, unspecified hyperlipidemia type - Plan: Lipid Panel  -Intolerant to statin.  Will recheck levels to determine if improvement with weight change, but at  his age would not recommend starting any medication at this time.  Prediabetes - Plan: Hemoglobin A1c  -Commended on 5 pound weight loss.  Portion control discussed, recheck 6 months.  Need for prophylactic vaccination and inoculation against influenza - Plan: Flu Vaccine QUAD High Dose(Fluad)   Meds ordered this encounter  Medications  . lisinopril-hydrochlorothiazide (ZESTORETIC) 10-12.5 MG tablet    Sig: Take 1 tablet by mouth daily.    Dispense:  90 tablet    Refill:  2  . amLODipine (NORVASC) 10 MG tablet  Sig: Take 1 tablet (10 mg total) by mouth daily.    Dispense:  90 tablet    Refill:  2   Patient Instructions     No med changes. Keep up the good work as weight is a little better today. Watch portion sizes, but no other new diet recommendations for now. Thanks for coming in today and take care!  If you have lab work done today you will be contacted with your lab results within the next 2 weeks.  If you have not heard from [place] then please contact us. The fastest way to get your results is to register for My Chart.   IF you received an x-ray today, you will receive an invoice from [facility]. Please contact [facility] Radiology at [phone] with questions or concerns regarding your invoice.   IF you received labwork today, you will receive an invoice from [place]. Please contact [facility] at [phone] with questions or concerns regarding your invoice.   Our billing staff will not be able to assist you with questions regarding bills from these companies.  You will be contacted with the lab results as soon as they are available. The fastest way to get your results is to activate your My Chart account. Instructions are located on the last page of this paperwork. If you have not heard from [place] regarding the results in 2 weeks, please contact this office.       Signed,   [name], MD [facility].  10/20/18  10:52 AM

## 2018-10-21 LAB — COMPREHENSIVE METABOLIC PANEL
ALT: 11 IU/L (ref 0–44)
AST: 10 IU/L (ref 0–40)
Albumin/Globulin Ratio: 1.6 (ref 1.2–2.2)
Albumin: 4.2 g/dL (ref 3.6–4.6)
Alkaline Phosphatase: 83 IU/L (ref 39–117)
BUN/Creatinine Ratio: 20 (ref 10–24)
BUN: 23 mg/dL (ref 8–27)
Bilirubin Total: 0.6 mg/dL (ref 0.0–1.2)
CO2: 26 mmol/L (ref 20–29)
Calcium: 9.2 mg/dL (ref 8.6–10.2)
Chloride: 101 mmol/L (ref 96–106)
Creatinine, Ser: 1.17 mg/dL (ref 0.76–1.27)
GFR calc Af Amer: 67 mL/min/{1.73_m2} (ref >59)
GFR calc non Af Amer: 58 mL/min/{1.73_m2} — ABNORMAL LOW (ref >59)
Globulin, Total: 2.6 g/dL (ref 1.5–4.5)
Glucose: 102 mg/dL — ABNORMAL HIGH (ref 65–99)
Potassium: 3.7 mmol/L (ref 3.5–5.2)
Sodium: 142 mmol/L (ref 134–144)
Total Protein: 6.8 g/dL (ref 6.0–8.5)

## 2018-10-21 LAB — LIPID PANEL
Chol/HDL Ratio: 3.5 ratio (ref 0.0–5.0)
Cholesterol, Total: 207 mg/dL — ABNORMAL HIGH (ref 100–199)
HDL: 60 mg/dL (ref >39)
LDL Chol Calc (NIH): 125 mg/dL — ABNORMAL HIGH (ref 0–99)
Triglycerides: 122 mg/dL (ref 0–149)
VLDL Cholesterol Cal: 22 mg/dL (ref 5–40)

## 2018-10-21 LAB — HEMOGLOBIN A1C
Est. average glucose Bld gHb Est-mCnc: 126 mg/dL
Hgb A1c MFr Bld: 6 % — ABNORMAL HIGH (ref 4.8–5.6)

## 2018-10-28 ENCOUNTER — Encounter: Payer: Self-pay | Admitting: Radiology

## 2018-10-29 ENCOUNTER — Telehealth: Payer: Self-pay | Admitting: *Deleted

## 2018-10-29 NOTE — Telephone Encounter (Signed)
Patient declined AWV 

## 2019-02-25 ENCOUNTER — Telehealth: Payer: Self-pay | Admitting: *Deleted

## 2019-02-25 NOTE — Telephone Encounter (Signed)
Patient declined AWV 

## 2019-04-20 ENCOUNTER — Encounter: Payer: Self-pay | Admitting: Family Medicine

## 2019-04-20 ENCOUNTER — Ambulatory Visit (INDEPENDENT_AMBULATORY_CARE_PROVIDER_SITE_OTHER): Payer: Medicare Other | Admitting: Family Medicine

## 2019-04-20 ENCOUNTER — Other Ambulatory Visit: Payer: Self-pay

## 2019-04-20 VITALS — BP 148/86 | HR 102 | Temp 97.9°F | Ht 60.0 in | Wt 209.0 lb

## 2019-04-20 DIAGNOSIS — R7303 Prediabetes: Secondary | ICD-10-CM

## 2019-04-20 DIAGNOSIS — I10 Essential (primary) hypertension: Secondary | ICD-10-CM | POA: Diagnosis not present

## 2019-04-20 DIAGNOSIS — E785 Hyperlipidemia, unspecified: Secondary | ICD-10-CM

## 2019-04-20 DIAGNOSIS — Z7189 Other specified counseling: Secondary | ICD-10-CM | POA: Diagnosis not present

## 2019-04-20 MED ORDER — LISINOPRIL-HYDROCHLOROTHIAZIDE 10-12.5 MG PO TABS: 1.0000 | ORAL_TABLET | Freq: Every day | ORAL | 2 refills | Status: DC

## 2019-04-20 MED ORDER — AMLODIPINE BESYLATE 10 MG PO TABS: 10.0000 mg | ORAL_TABLET | Freq: Every day | ORAL | 2 refills | Status: DC

## 2019-04-20 NOTE — Patient Instructions (Addendum)
  Cut back on juices - some are ok, but those can be high. Can eat actual fruits which may lessen amount of sugars you take in. Water is best throughout the day - especially as it warms it.  Keep a record of your blood pressures outside of the office back on meds. If remains over 140/90 - schedule another visit.   Thanks for coming in today and stay safe.   I do recommend the covid vaccine, please let me know if there are more questions.  COVID-19 Vaccine Information can be found at: ShippingScam.co.uk For questions related to vaccine distribution or appointments, please email [email or web address] or call [phone].    If you have lab work done today you will be contacted with your lab results within the next 2 weeks.  If you have not heard from [place] then please contact us. The fastest way to get your results is to register for My Chart.   IF you received an x-ray today, you will receive an invoice from [facility]. Please contact [facility] Radiology at [phone] with questions or concerns regarding your invoice.   IF you received labwork today, you will receive an invoice from [place]. Please contact [facility] at [phone] with questions or concerns regarding your invoice.   Our billing staff will not be able to assist you with questions regarding bills from these companies.  You will be contacted with the lab results as soon as they are available. The fastest way to get your results is to activate your My Chart account. Instructions are located on the last page of this paperwork. If you have not heard from [place] regarding the results in 2 weeks, please contact this office.

## 2019-04-20 NOTE — Progress Notes (Signed)
Subjective:  Patient ID: Steven Vargas, male    DOB: [birth year]  Age: 84 y.o. MRN: [id number]  CC:  Chief Complaint  Patient presents with  . Hypertension    Follow-up. pt hasn't had any issues with BP at home. pt checks BP daily and brought a list of recordede BP's.  pt dosn't express any physical symptoms of hypertension. pt hasn't taken BP medication in about 10 days; due to need for refill.    HPI Steven Vargas presents for   Hypertension: Off both meds past 10 days due to need for refills.  Previously tolerating combination of lisinopril HCTZ 10/12.5 mg with amlodipine 10 mg daily. Feels better than last year.  Home readings on meds. 106-144/74-100, no side effects on this combo.   BP Readings from Last 3 Encounters:  04/20/19 (!) 148/86  10/20/18 129/78  04/17/18 130/82   Lab Results  Component Value Date   CREATININE 1.17 10/20/2018   Prediabetes Min walking in winter, leg exercises daily.  No fast food. No soda, drinks juices - apple or cranberry. Few cups per day. V8 drink.   Wt Readings from Last 3 Encounters:  04/20/19 209 lb (94.8 kg)  10/20/18 204 lb 6.4 oz (92.7 kg)  04/17/18 209 lb 12.8 oz (95.2 kg)   Lab Results  Component Value Date   HGBA1C 6.0 (H) 10/20/2018   Hyperlipidemia: Mild hyperlipidemia as below.  Not currently on statin.  Plan on diet/exercise changes.  HDL looked good at 60. Lab Results  Component Value Date   CHOL 207 (H) 10/20/2018   HDL 60 10/20/2018   LDLCALC 125 (H) 10/20/2018   TRIG 122 10/20/2018   CHOLHDL 3.5 10/20/2018   Lab Results  Component Value Date   ALT 11 10/20/2018   AST 10 10/20/2018   ALKPHOS 83 10/20/2018   BILITOT 0.6 10/20/2018       History Patient Active Problem List   Diagnosis Date Noted  . Diverticulosis of colon (without mention of hemorrhage) 04/06/2011  . Benign neoplasm of colon 04/06/2011  . Diverticulosis of colon with hemorrhage 04/06/2011  . Diaphragmatic hernia without mention of  obstruction or gangrene 04/06/2011  . Hyperglycemia 04/05/2011  . Syncope and collapse 04/05/2011  . Ankle fracture, lateral malleolus, closed 04/05/2011  . Acute posthemorrhagic anemia 04/05/2011  . Blood in stool 04/05/2011  . Acute GI bleeding 04/05/2011   Past Medical History:  Diagnosis Date  . Gastric ulcer    secondary to nsaid 10 years ago  . GI bleed 04/04/2011  . Hip pain, chronic    right  . HLD (hyperlipidemia)   . Hypertension   . Obesity (BMI 35.0-39.9 without comorbidity) 03/2011  . Stroke [facility])    2005   Past Surgical History:  Procedure Laterality Date  . TONSILLECTOMY     Allergies  Allergen Reactions  . Pravastatin Swelling and Other (See Comments)    Reported jaw and lip swelling along with myalgias in the back with pravastatin.  Symptoms resolved with stopping.    Prior to Admission medications   Medication Sig Start Date End Date Taking? Authorizing Provider  amLODipine (NORVASC) 10 MG tablet Take 1 tablet (10 mg total) by mouth daily. 10/20/18   Wendie Agreste, MD  lisinopril-hydrochlorothiazide (ZESTORETIC) 10-12.5 MG tablet Take 1 tablet by mouth daily. 10/20/18   Wendie Agreste, MD   Social History   Socioeconomic History  . Marital status: Married    Spouse name: Not on file  . Number of  children: Not on file  . Years of education: Not on file  . Highest education level: Not on file  Occupational History  . Not on file  Tobacco Use  . Smoking status: Former Research scientist (life sciences)  . Smokeless tobacco: Never Used  Substance and Sexual Activity  . Alcohol use: Yes    Comment: occasional  . Drug use: No  . Sexual activity: Not Currently  Other Topics Concern  . Not on file  Social History Narrative   Lives in [place], [place] with his wife and son. 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 Drinks 2-3 beers once every few weeks. Smoking 2 years ago. Used to smoke 2-3 packs per day before that for past many years. Never did illicit  drugs. Has United health care Medicare.   Social Determinants of Health   Financial Resource Strain:   . Difficulty of Paying Living Expenses:   Food Insecurity:   . Worried About Charity fundraiser in the Last Year:   . Arboriculturist in the Last Year:   Transportation Needs:   . Film/video editor (Medical):   Marland Kitchen Lack of Transportation (Non-Medical):   Physical Activity:   . Days of Exercise per Week:   . Minutes of Exercise per Session:   Stress:   . Feeling of Stress :   Social Connections:   . Frequency of Communication with Friends and Family:   . Frequency of Social Gatherings with Friends and Family:   . Attends Religious Services:   . Active Member of Clubs or Organizations:   . Attends Archivist Meetings:   Marland Kitchen Marital Status:   Intimate Partner Violence:   . Fear of Current or Ex-Partner:   . Emotionally Abused:   Marland Kitchen Physically Abused:   . Sexually Abused:     Review of Systems  Constitutional: Negative for fatigue and unexpected weight change.  Eyes: Negative for visual disturbance.  Respiratory: Negative for cough, chest tightness and shortness of breath.   Cardiovascular: Negative for chest pain, palpitations and leg swelling.  Gastrointestinal: Negative for abdominal pain and blood in stool.  Neurological: Negative for dizziness, light-headedness and headaches.     Objective:   Vitals:   04/20/19 1037 04/20/19 1040  BP: (!) 154/80 (!) 148/86  Pulse: (!) 102   Temp: 97.9 F (36.6 C)   TempSrc: Temporal   SpO2: 94%   Weight: 209 lb (94.8 kg)   Height: 5' (1.524 m)      Physical Exam Vitals reviewed.  Constitutional:      Appearance: He is well-developed.  HENT:     Head: Normocephalic and atraumatic.  Eyes:     Pupils: Pupils are equal, round, and reactive to light.  Neck:     Vascular: No carotid bruit or JVD.  Cardiovascular:     Rate and Rhythm: Normal rate and regular rhythm.     Heart sounds: Normal heart sounds. No  murmur.  Pulmonary:     Effort: Pulmonary effort is normal.     Breath sounds: Normal breath sounds. No rales.  Skin:    General: Skin is warm and dry.  Neurological:     Mental Status: He is alert and oriented to person, place, and time.        Assessment & Plan:  Steven Vargas is a 84 y.o. male . Prediabetes - Plan: Hemoglobin A1c  -decrease juices, more water, anticipate improved readings when more walking/exercise.  Check A1c.  Essential hypertension - Plan: amLODipine (NORVASC) 10 MG tablet, lisinopril-hydrochlorothiazide (ZESTORETIC) 10-12.5 MG tablet, Lipid panel, Comprehensive metabolic panel  -Stable on home readings when he was on medication, elevated in office off meds.  Asymptomatic.  Restart previous regimen with RTC precautions.  Labs pending  Hyperlipidemia, unspecified hyperlipidemia type  -Not on statin, diet changes discussed as above, anticipate improved exercise with improved weather.  Repeat labs  Counseled about COVID-19 virus infection  Questions were answered regarding COVID-19 vaccine, as well as recommendations for him to have that vaccine based on age and increased risk for complications.  Understanding expressed.  Resource provided for scheduling.  Meds ordered this encounter  Medications  . amLODipine (NORVASC) 10 MG tablet    Sig: Take 1 tablet (10 mg total) by mouth daily.    Dispense:  90 tablet    Refill:  2  . lisinopril-hydrochlorothiazide (ZESTORETIC) 10-12.5 MG tablet    Sig: Take 1 tablet by mouth daily.    Dispense:  90 tablet    Refill:  2   Patient Instructions    Cut back on juices - some are ok, but those can be high. Can eat actual fruits which may lessen amount of sugars you take in. Water is best throughout the day - especially as it warms it.  Keep a record of your blood pressures outside of the office back on meds. If remains over 140/90 - schedule another visit.   Thanks for coming in today and stay safe.   I do recommend  the covid vaccine, please let me know if there are more questions.  COVID-19 Vaccine Information can be found at: ShippingScam.co.uk For questions related to vaccine distribution or appointments, please email [email or web address] or call [phone].    If you have lab work done today you will be contacted with your lab results within the next 2 weeks.  If you have not heard from [place] then please contact us. The fastest way to get your results is to register for My Chart.   IF you received an x-ray today, you will receive an invoice from [facility] Radiology. Please contact [facility] Radiology at [phone] with questions or concerns regarding your invoice.   IF you received labwork today, you will receive an invoice from [place]. Please contact [facility] at [phone] with questions or concerns regarding your invoice.   Our billing staff will not be able to assist you with questions regarding bills from these companies.  You will be contacted with the lab results as soon as they are available. The fastest way to get your results is to activate your My Chart account. Instructions are located on the last page of this paperwork. If you have not heard from [place] regarding the results in 2 weeks, please contact this office.         Signed, [name], MD [facility]

## 2019-04-21 ENCOUNTER — Ambulatory Visit: Payer: Medicare Other | Admitting: Family Medicine

## 2019-04-21 LAB — HEMOGLOBIN A1C
Est. average glucose Bld gHb Est-mCnc: 131 mg/dL
Hgb A1c MFr Bld: 6.2 % — ABNORMAL HIGH (ref 4.8–5.6)

## 2019-04-21 LAB — COMPREHENSIVE METABOLIC PANEL
ALT: 18 IU/L (ref 0–44)
AST: 18 IU/L (ref 0–40)
Albumin/Globulin Ratio: 1.5 (ref 1.2–2.2)
Albumin: 4.4 g/dL (ref 3.6–4.6)
Alkaline Phosphatase: 87 IU/L (ref 39–117)
BUN/Creatinine Ratio: 16 (ref 10–24)
BUN: 17 mg/dL (ref 8–27)
Bilirubin Total: 0.4 mg/dL (ref 0.0–1.2)
CO2: 24 mmol/L (ref 20–29)
Calcium: 9.1 mg/dL (ref 8.6–10.2)
Chloride: 106 mmol/L (ref 96–106)
Creatinine, Ser: 1.05 mg/dL (ref 0.76–1.27)
GFR calc Af Amer: 77 mL/min/{1.73_m2} (ref >59)
GFR calc non Af Amer: 66 mL/min/{1.73_m2} (ref >59)
Globulin, Total: 2.9 g/dL (ref 1.5–4.5)
Glucose: 99 mg/dL (ref 65–99)
Potassium: 3.7 mmol/L (ref 3.5–5.2)
Sodium: 145 mmol/L — ABNORMAL HIGH (ref 134–144)
Total Protein: 7.3 g/dL (ref 6.0–8.5)

## 2019-04-21 LAB — LIPID PANEL
Chol/HDL Ratio: 3.4 ratio (ref 0.0–5.0)
Cholesterol, Total: 228 mg/dL — ABNORMAL HIGH (ref 100–199)
HDL: 67 mg/dL (ref >39)
LDL Chol Calc (NIH): 137 mg/dL — ABNORMAL HIGH (ref 0–99)
Triglycerides: 137 mg/dL (ref 0–149)
VLDL Cholesterol Cal: 24 mg/dL (ref 5–40)

## 2019-05-04 ENCOUNTER — Encounter: Payer: Self-pay | Admitting: Radiology

## 2019-05-29 ENCOUNTER — Ambulatory Visit: Payer: Medicare Other | Attending: Internal Medicine

## 2019-05-29 DIAGNOSIS — Z23 Encounter for immunization: Secondary | ICD-10-CM

## 2019-05-29 NOTE — Progress Notes (Signed)
   Covid-19 Vaccination Clinic  Name:  Almedin Gerlt    MRN: [id number] DOB: 1935/02/08  05/29/2019  Mr. Wallman was observed post Covid-19 immunization for 15 minutes without incident. He was provided with Vaccine Information Sheet and instruction to access the V-Safe system.   Mr. Bolerjack was instructed to call 911 with any severe reactions post vaccine: Marland Kitchen Difficulty breathing  . Swelling of face and throat  . A fast heartbeat  . A bad rash all over body  . Dizziness and weakness   Immunizations Administered    Name Date Dose VIS Date Route   Pfizer COVID-19 Vaccine 05/29/2019 12:13 PM 0.3 mL 03/25/2018 Intramuscular   Manufacturer: [place]   Lot: [id number]   [place]: [id number]

## 2019-06-22 ENCOUNTER — Ambulatory Visit: Payer: Medicare Other | Attending: Internal Medicine

## 2019-06-22 DIAGNOSIS — Z23 Encounter for immunization: Secondary | ICD-10-CM

## 2019-06-22 NOTE — Progress Notes (Signed)
   Covid-19 Vaccination Clinic  Name:  Steven Vargas    MRN: [id number] DOB: 01-31-1935  06/22/2019  [name] was observed post Covid-19 immunization for 15 minutes without incident. He was provided with Vaccine Information Sheet and instruction to access the V-Safe system.   [name] was instructed to call 911 with any severe reactions post vaccine: Marland Kitchen Difficulty breathing  . Swelling of face and throat  . A fast heartbeat  . A bad rash all over body  . Dizziness and weakness   Immunizations Administered    Name Date Dose VIS Date Route   Pfizer COVID-19 Vaccine 06/22/2019 11:54 AM 0.3 mL 03/25/2018 Intramuscular   Manufacturer: [place]   Lot: [id number]   [place]: [id number]

## 2019-10-21 ENCOUNTER — Encounter: Payer: Self-pay | Admitting: Family Medicine

## 2019-10-21 ENCOUNTER — Ambulatory Visit (INDEPENDENT_AMBULATORY_CARE_PROVIDER_SITE_OTHER): Payer: Medicare Other | Admitting: Family Medicine

## 2019-10-21 ENCOUNTER — Other Ambulatory Visit: Payer: Self-pay

## 2019-10-21 VITALS — BP 156/92 | HR 101 | Temp 98.2°F | Ht 60.0 in | Wt 199.2 lb

## 2019-10-21 DIAGNOSIS — E785 Hyperlipidemia, unspecified: Secondary | ICD-10-CM | POA: Diagnosis not present

## 2019-10-21 DIAGNOSIS — R7303 Prediabetes: Secondary | ICD-10-CM

## 2019-10-21 DIAGNOSIS — I10 Essential (primary) hypertension: Secondary | ICD-10-CM

## 2019-10-21 MED ORDER — LISINOPRIL-HYDROCHLOROTHIAZIDE 10-12.5 MG PO TABS: 1.0000 | ORAL_TABLET | Freq: Every day | ORAL | 2 refills | Status: DC

## 2019-10-21 MED ORDER — AMLODIPINE BESYLATE 10 MG PO TABS: 10.0000 mg | ORAL_TABLET | Freq: Every day | ORAL | 2 refills | Status: DC

## 2019-10-21 NOTE — Progress Notes (Signed)
Subjective:  Patient ID: Steven Vargas, male    DOB: 1935-02-09  Age: 84 y.o. MRN: [id number]  CC:  Chief Complaint  Patient presents with  . Follow-up    on hypertension,hyperlipidemia, and prediabetes. pt reourts he has been out of his amlodipine since AUG. pt states since running out the pt reports his BP has been running high.PT reports no physical symptoms of these conditions since last OV. pt has brought a recored of the past 4 days readings are above normal range.PT isn't fasting.    HPI Steven Vargas presents for   prediabetes: Diet changes discussed in March with avoidance of large amounts of juices, whole fruits preferred. Still drinking some juice, no soda, sweet tea or fast food.  Has loose tooth - would like to have pulled. No regular dentist.   Wt Readings from Last 3 Encounters:  10/21/19 199 lb 3.2 oz (90.4 kg)  04/20/19 209 lb (94.8 kg)  10/20/18 204 lb 6.4 oz (92.7 kg)    Lab Results  Component Value Date   HGBA1C 6.2 (H) 04/20/2019   HGBA1C 6.0 (H) 10/20/2018   HGBA1C 6.2 (H) 04/17/2018   Lab Results  Component Value Date   LDLCALC 137 (H) 04/20/2019   CREATININE 1.05 04/20/2019    . Hypertension: Last seen in March.  Off meds at that time for 10 days, had run out of refills.  Previously tolerated combination of lisinopril HCTZ 10/12.5 mg with additional amlodipine 10 mg daily. Did not call for refill.  Home readings on medications at that time had ranged from 106-144/74-100. He has been out of amlodipine for the past approximate 1 month, and as such has had some elevated readings but no new symptoms. Stomach irritation last week, feels better now.  Decreased appetite with some weight loss during the illness but is back to normal now - "better than normal" Home readings: Past 4 days, 171/108, 168/97, 167/92, 159/92, 157/103, 170/97, 138/89. Home readings 120s 130s on amlodipine BP Readings from Last 3 Encounters:  10/21/19 (!) 156/92  04/20/19 (!) 148/86    10/20/18 129/78   Lab Results  Component Value Date   CREATININE 1.05 04/20/2019   Hyperlipidemia: Mild elevation previously, planned on diet/exercise changes, he did have good HDL reading at 67.oatmeal and coffee this morning.  Lab Results  Component Value Date   CHOL 228 (H) 04/20/2019   HDL 67 04/20/2019   LDLCALC 137 (H) 04/20/2019   TRIG 137 04/20/2019   CHOLHDL 3.4 04/20/2019   Lab Results  Component Value Date   ALT 18 04/20/2019   AST 18 04/20/2019   ALKPHOS 87 04/20/2019   BILITOT 0.4 04/20/2019    Had covid vaccine.    History Patient Active Problem List   Diagnosis Date Noted  . Diverticulosis of colon (without mention of hemorrhage) 04/06/2011  . Benign neoplasm of colon 04/06/2011  . Diverticulosis of colon with hemorrhage 04/06/2011  . Diaphragmatic hernia without mention of obstruction or gangrene 04/06/2011  . Hyperglycemia 04/05/2011  . Syncope and collapse 04/05/2011  . Ankle fracture, lateral malleolus, closed 04/05/2011  . Acute posthemorrhagic anemia 04/05/2011  . Blood in stool 04/05/2011  . Acute GI bleeding 04/05/2011   Past Medical History:  Diagnosis Date  . Gastric ulcer    secondary to nsaid 10 years ago  . GI bleed 04/04/2011  . Hip pain, chronic    right  . HLD (hyperlipidemia)   . Hypertension   . Obesity (BMI 35.0-39.9 without comorbidity) 03/2011  .  Stroke [facility])    2005   Past Surgical History:  Procedure Laterality Date  . TONSILLECTOMY     Allergies  Allergen Reactions  . Pravastatin Swelling and Other (See Comments)    Reported jaw and lip swelling along with myalgias in the back with pravastatin.  Symptoms resolved with stopping.    Prior to Admission medications   Medication Sig Start Date End Date Taking? Authorizing Provider  lisinopril-hydrochlorothiazide (ZESTORETIC) 10-12.5 MG tablet Take 1 tablet by mouth daily. 04/20/19  Yes Wendie Agreste, MD  amLODipine (NORVASC) 10 MG tablet Take 1 tablet (10 mg total)  by mouth daily. Patient not taking: Reported on 10/21/2019 04/20/19   Wendie Agreste, MD   Social History   Socioeconomic History  . Marital status: Married    Spouse name: Not on file  . Number of children: Not on file  . Years of education: Not on file  . Highest education level: Not on file  Occupational History  . Not on file  Tobacco Use  . Smoking status: Former Research scientist (life sciences)  . Smokeless tobacco: Never Used  Substance and Sexual Activity  . Alcohol use: Yes    Comment: occasional  . Drug use: No  . Sexual activity: Not Currently  Other Topics Concern  . Not on file  Social History Narrative   Lives in [place], [place] with his wife and son. 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 Drinks 2-3 beers once every few weeks. Smoking 2 years ago. Used to smoke 2-3 packs per day before that for past many years. Never did illicit drugs. Has United health care Medicare.   Social Determinants of Health   Financial Resource Strain:   . Difficulty of Paying Living Expenses: Not on file  Food Insecurity:   . Worried About Charity fundraiser in the Last Year: Not on file  . Ran Out of Food in the Last Year: Not on file  Transportation Needs:   . Lack of Transportation (Medical): Not on file  . Lack of Transportation (Non-Medical): Not on file  Physical Activity:   . Days of Exercise per Week: Not on file  . Minutes of Exercise per Session: Not on file  Stress:   . Feeling of Stress : Not on file  Social Connections:   . Frequency of Communication with Friends and Family: Not on file  . Frequency of Social Gatherings with Friends and Family: Not on file  . Attends Religious Services: Not on file  . Active Member of Clubs or Organizations: Not on file  . Attends Archivist Meetings: Not on file  . Marital Status: Not on file  Intimate Partner Violence:   . Fear of Current or Ex-Partner: Not on file  . Emotionally Abused: Not on file  . Physically  Abused: Not on file  . Sexually Abused: Not on file    Review of Systems  Constitutional: Negative for fatigue and unexpected weight change.  Eyes: Negative for visual disturbance.  Respiratory: Negative for cough, chest tightness and shortness of breath.   Cardiovascular: Negative for chest pain, palpitations and leg swelling.  Gastrointestinal: Negative for abdominal pain and blood in stool.  Neurological: Negative for dizziness, light-headedness and headaches.     Objective:   Vitals:   10/21/19 1023 10/21/19 1030  BP: (!) 181/93 (!) 156/92  Pulse: (!) 101   Temp: 98.2 F (36.8 C)   TempSrc: Temporal   SpO2: 95%   Weight:  199 lb 3.2 oz (90.4 kg)   Height: 5' (1.524 m)      Physical Exam Vitals reviewed.  Constitutional:      Appearance: He is well-developed.  HENT:     Head: Normocephalic and atraumatic.  Eyes:     Pupils: Pupils are equal, round, and reactive to light.  Neck:     Vascular: No carotid bruit or JVD.  Cardiovascular:     Rate and Rhythm: Normal rate and regular rhythm.     Heart sounds: Normal heart sounds. No murmur heard.   Pulmonary:     Effort: Pulmonary effort is normal.     Breath sounds: Normal breath sounds. No rales.  Skin:    General: Skin is warm and dry.  Neurological:     Mental Status: He is alert and oriented to person, place, and time.  Psychiatric:        Mood and Affect: Mood normal.        Behavior: Behavior normal.      Assessment & Plan:  Steven Vargas is a 84 y.o. male . Prediabetes - Plan: Hemoglobin A1c  -Weight has decreased, attributes to recent illness.  Now symptoms have resolved, asymptomatic on exam, abdomen nontender.  RTC precautions.  Check A1c.  Caution with large amounts of juice.  Essential hypertension - Plan: Comprehensive metabolic panel, lisinopril-hydrochlorothiazide (ZESTORETIC) 10-12.5 MG tablet, amLODipine (NORVASC) 10 MG tablet  -Decreased control off additional amlodipine.  Restart previous  regimen of lisinopril HCTZ 10/12.5 as well as additional 10 mg amlodipine.  Recheck next 3 months.  RTC precautions.  Check labs.  Hyperlipidemia, unspecified hyperlipidemia type - Plan: Comprehensive metabolic panel Not fasting today, repeat visit 3 months for fasting lipid panel at that time, repeat hypertension assessment.  He did request phone numbers for dentistry given loose tooth which he would like to have removed.  Phone numbers provided.  Meds ordered this encounter  Medications  . lisinopril-hydrochlorothiazide (ZESTORETIC) 10-12.5 MG tablet    Sig: Take 1 tablet by mouth daily.    Dispense:  90 tablet    Refill:  2  . amLODipine (NORVASC) 10 MG tablet    Sig: Take 1 tablet (10 mg total) by mouth daily.    Dispense:  90 tablet    Refill:  2   Patient Instructions   Restart amlodipine 10 mg once a day in addition to the combination pill and see if we can get you back to the normal range  No other changes for now but I will check some blood work on you today.    [facility]  Address: [address], [place], [place] [address]  [email or web address]  Phone: [phone]  [facility]  Address: [address] [place], [place] [address] Phone: [phone]    If you have lab work done today you will be contacted with your lab results within the next 2 weeks.  If you have not heard from [place] then please contact us. The fastest way to get your results is to register for My Chart.   IF you received an x-ray today, you will receive an invoice from [facility]. Please contact [facility] at [phone] with questions or concerns regarding your invoice.   IF you received labwork today, you will receive an invoice from [facility]. Please contact [facility] at [phone] with questions or concerns regarding your invoice.   Our billing staff will not be able to assist you with questions regarding bills from these companies.  You  will be contacted with the lab results as soon as they are available. The fastest way to get your results is to activate your My Chart account. Instructions are located on the last page of this paperwork. If you have not heard from [place] regarding the results in 2 weeks, please contact this office.         Signed, [name], MD [facility]

## 2019-10-21 NOTE — Patient Instructions (Addendum)
Restart amlodipine 10 mg once a day in addition to the combination pill and see if we can get you back to the normal range  No other changes for now but I will check some blood work on you today.    [facility]  Address: [address], [place], [place] [address]  [email or web address]  Phone: [phone]  [facility]  Address: [address] [place], [place] [address] Phone: [phone]    If you have lab work done today you will be contacted with your lab results within the next 2 weeks.  If you have not heard from [place] then please contact us. The fastest way to get your results is to register for My Chart.   IF you received an x-ray today, you will receive an invoice from [facility]. Please contact [facility] at [phone] with questions or concerns regarding your invoice.   IF you received labwork today, you will receive an invoice from [facility]. Please contact [facility] at [phone] with questions or concerns regarding your invoice.   Our billing staff will not be able to assist you with questions regarding bills from these companies.  You will be contacted with the lab results as soon as they are available. The fastest way to get your results is to activate your My Chart account. Instructions are located on the last page of this paperwork. If you have not heard from [place] regarding the results in 2 weeks, please contact this office.

## 2019-10-22 LAB — COMPREHENSIVE METABOLIC PANEL
ALT: 17 IU/L (ref 0–44)
AST: 17 IU/L (ref 0–40)
Albumin/Globulin Ratio: 1.4 (ref 1.2–2.2)
Albumin: 4.1 g/dL (ref 3.6–4.6)
Alkaline Phosphatase: 81 IU/L (ref 44–121)
BUN/Creatinine Ratio: 12 (ref 10–24)
BUN: 13 mg/dL (ref 8–27)
Bilirubin Total: 0.3 mg/dL (ref 0.0–1.2)
CO2: 24 mmol/L (ref 20–29)
Calcium: 9 mg/dL (ref 8.6–10.2)
Chloride: 100 mmol/L (ref 96–106)
Creatinine, Ser: 1.1 mg/dL (ref 0.76–1.27)
GFR calc Af Amer: 72 mL/min/{1.73_m2} (ref >59)
GFR calc non Af Amer: 62 mL/min/{1.73_m2} (ref >59)
Globulin, Total: 2.9 g/dL (ref 1.5–4.5)
Glucose: 99 mg/dL (ref 65–99)
Potassium: 3.4 mmol/L — ABNORMAL LOW (ref 3.5–5.2)
Sodium: 142 mmol/L (ref 134–144)
Total Protein: 7 g/dL (ref 6.0–8.5)

## 2019-10-22 LAB — HEMOGLOBIN A1C
Est. average glucose Bld gHb Est-mCnc: 123 mg/dL
Hgb A1c MFr Bld: 5.9 % — ABNORMAL HIGH (ref 4.8–5.6)

## 2019-10-27 ENCOUNTER — Telehealth: Payer: Self-pay | Admitting: *Deleted

## 2019-10-27 NOTE — Telephone Encounter (Signed)
DECLINED AWV 

## 2019-10-29 ENCOUNTER — Encounter: Payer: Self-pay | Admitting: Radiology

## 2019-12-15 ENCOUNTER — Emergency Department (HOSPITAL_COMMUNITY): Payer: Medicare Other

## 2019-12-15 ENCOUNTER — Encounter (HOSPITAL_COMMUNITY): Payer: Self-pay | Admitting: Emergency Medicine

## 2019-12-15 ENCOUNTER — Other Ambulatory Visit: Payer: Self-pay | Admitting: Physician Assistant

## 2019-12-15 ENCOUNTER — Other Ambulatory Visit: Payer: Self-pay

## 2019-12-15 ENCOUNTER — Inpatient Hospital Stay (HOSPITAL_COMMUNITY)
Admission: EM | Admit: 2019-12-15 | Discharge: 2019-12-20 | DRG: 378 | Disposition: A | Discharge status: Home / Self Care | Payer: Medicare Other | Attending: Internal Medicine | Admitting: Internal Medicine

## 2019-12-15 DIAGNOSIS — K259 Gastric ulcer, unspecified as acute or chronic, without hemorrhage or perforation: Secondary | ICD-10-CM | POA: Diagnosis not present

## 2019-12-15 DIAGNOSIS — Z841 Family history of disorders of kidney and ureter: Secondary | ICD-10-CM

## 2019-12-15 DIAGNOSIS — K2900 Acute gastritis without bleeding: Secondary | ICD-10-CM | POA: Diagnosis not present

## 2019-12-15 DIAGNOSIS — E876 Hypokalemia: Secondary | ICD-10-CM | POA: Diagnosis not present

## 2019-12-15 DIAGNOSIS — N401 Enlarged prostate with lower urinary tract symptoms: Secondary | ICD-10-CM | POA: Diagnosis present

## 2019-12-15 DIAGNOSIS — Z8711 Personal history of peptic ulcer disease: Secondary | ICD-10-CM

## 2019-12-15 DIAGNOSIS — N1339 Other hydronephrosis: Secondary | ICD-10-CM | POA: Diagnosis present

## 2019-12-15 DIAGNOSIS — N179 Acute kidney failure, unspecified: Secondary | ICD-10-CM | POA: Diagnosis present

## 2019-12-15 DIAGNOSIS — I10 Essential (primary) hypertension: Secondary | ICD-10-CM | POA: Diagnosis not present

## 2019-12-15 DIAGNOSIS — E669 Obesity, unspecified: Secondary | ICD-10-CM | POA: Diagnosis present

## 2019-12-15 DIAGNOSIS — E785 Hyperlipidemia, unspecified: Secondary | ICD-10-CM | POA: Diagnosis present

## 2019-12-15 DIAGNOSIS — D62 Acute posthemorrhagic anemia: Secondary | ICD-10-CM | POA: Diagnosis present

## 2019-12-15 DIAGNOSIS — I959 Hypotension, unspecified: Secondary | ICD-10-CM | POA: Diagnosis not present

## 2019-12-15 DIAGNOSIS — I499 Cardiac arrhythmia, unspecified: Secondary | ICD-10-CM | POA: Diagnosis not present

## 2019-12-15 DIAGNOSIS — Z87891 Personal history of nicotine dependence: Secondary | ICD-10-CM

## 2019-12-15 DIAGNOSIS — K2901 Acute gastritis with bleeding: Principal | ICD-10-CM | POA: Diagnosis present

## 2019-12-15 DIAGNOSIS — R3916 Straining to void: Secondary | ICD-10-CM | POA: Diagnosis present

## 2019-12-15 DIAGNOSIS — J9811 Atelectasis: Secondary | ICD-10-CM | POA: Diagnosis not present

## 2019-12-15 DIAGNOSIS — S3729XA Other injury of bladder, initial encounter: Secondary | ICD-10-CM | POA: Diagnosis not present

## 2019-12-15 DIAGNOSIS — R1310 Dysphagia, unspecified: Secondary | ICD-10-CM | POA: Diagnosis present

## 2019-12-15 DIAGNOSIS — K922 Gastrointestinal hemorrhage, unspecified: Secondary | ICD-10-CM | POA: Diagnosis not present

## 2019-12-15 DIAGNOSIS — Y846 Urinary catheterization as the cause of abnormal reaction of the patient, or of later complication, without mention of misadventure at the time of the procedure: Secondary | ICD-10-CM | POA: Diagnosis not present

## 2019-12-15 DIAGNOSIS — R079 Chest pain, unspecified: Secondary | ICD-10-CM | POA: Diagnosis not present

## 2019-12-15 DIAGNOSIS — R310 Gross hematuria: Secondary | ICD-10-CM | POA: Diagnosis not present

## 2019-12-15 DIAGNOSIS — Z20822 Contact with and (suspected) exposure to covid-19: Secondary | ICD-10-CM | POA: Diagnosis present

## 2019-12-15 DIAGNOSIS — R195 Other fecal abnormalities: Secondary | ICD-10-CM | POA: Diagnosis not present

## 2019-12-15 DIAGNOSIS — K811 Chronic cholecystitis: Secondary | ICD-10-CM | POA: Diagnosis not present

## 2019-12-15 DIAGNOSIS — Z6832 Body mass index (BMI) 32.0-32.9, adult: Secondary | ICD-10-CM

## 2019-12-15 DIAGNOSIS — K921 Melena: Secondary | ICD-10-CM | POA: Diagnosis not present

## 2019-12-15 DIAGNOSIS — Y92239 Unspecified place in hospital as the place of occurrence of the external cause: Secondary | ICD-10-CM | POA: Diagnosis not present

## 2019-12-15 DIAGNOSIS — K2950 Unspecified chronic gastritis without bleeding: Secondary | ICD-10-CM | POA: Diagnosis not present

## 2019-12-15 DIAGNOSIS — Z833 Family history of diabetes mellitus: Secondary | ICD-10-CM

## 2019-12-15 DIAGNOSIS — R0789 Other chest pain: Secondary | ICD-10-CM | POA: Diagnosis not present

## 2019-12-15 DIAGNOSIS — Z823 Family history of stroke: Secondary | ICD-10-CM | POA: Diagnosis not present

## 2019-12-15 DIAGNOSIS — R338 Other retention of urine: Secondary | ICD-10-CM | POA: Diagnosis not present

## 2019-12-15 DIAGNOSIS — R0902 Hypoxemia: Secondary | ICD-10-CM | POA: Diagnosis not present

## 2019-12-15 DIAGNOSIS — Z8673 Personal history of transient ischemic attack (TIA), and cerebral infarction without residual deficits: Secondary | ICD-10-CM

## 2019-12-15 DIAGNOSIS — Z79899 Other long term (current) drug therapy: Secondary | ICD-10-CM

## 2019-12-15 DIAGNOSIS — K5900 Constipation, unspecified: Secondary | ICD-10-CM | POA: Diagnosis present

## 2019-12-15 DIAGNOSIS — D649 Anemia, unspecified: Secondary | ICD-10-CM | POA: Diagnosis not present

## 2019-12-15 DIAGNOSIS — R000 Tachycardia, unspecified: Secondary | ICD-10-CM | POA: Diagnosis not present

## 2019-12-15 DIAGNOSIS — R911 Solitary pulmonary nodule: Secondary | ICD-10-CM | POA: Diagnosis not present

## 2019-12-15 DIAGNOSIS — R339 Retention of urine, unspecified: Secondary | ICD-10-CM | POA: Diagnosis not present

## 2019-12-15 DIAGNOSIS — Z888 Allergy status to other drugs, medicaments and biological substances status: Secondary | ICD-10-CM

## 2019-12-15 DIAGNOSIS — Z743 Need for continuous supervision: Secondary | ICD-10-CM | POA: Diagnosis not present

## 2019-12-15 DIAGNOSIS — N320 Bladder-neck obstruction: Secondary | ICD-10-CM | POA: Diagnosis present

## 2019-12-15 DIAGNOSIS — R7303 Prediabetes: Secondary | ICD-10-CM | POA: Diagnosis present

## 2019-12-15 DIAGNOSIS — Z8719 Personal history of other diseases of the digestive system: Secondary | ICD-10-CM

## 2019-12-15 LAB — COMPREHENSIVE METABOLIC PANEL
ALT: 26 U/L (ref 0–44)
AST: 15 U/L (ref 15–41)
Albumin: 2.8 g/dL — ABNORMAL LOW (ref 3.5–5.0)
Alkaline Phosphatase: 37 U/L — ABNORMAL LOW (ref 38–126)
Anion gap: 10 (ref 5–15)
BUN: 50 mg/dL — ABNORMAL HIGH (ref 8–23)
CO2: 26 mmol/L (ref 22–32)
Calcium: 8.6 mg/dL — ABNORMAL LOW (ref 8.9–10.3)
Chloride: 105 mmol/L (ref 98–111)
Creatinine, Ser: 2.02 mg/dL — ABNORMAL HIGH (ref 0.61–1.24)
GFR, Estimated: 32 mL/min — ABNORMAL LOW (ref >60)
Glucose, Bld: 156 mg/dL — ABNORMAL HIGH (ref 70–99)
Potassium: 3.4 mmol/L — ABNORMAL LOW (ref 3.5–5.1)
Sodium: 141 mmol/L (ref 135–145)
Total Bilirubin: 0.4 mg/dL (ref 0.3–1.2)
Total Protein: 5.1 g/dL — ABNORMAL LOW (ref 6.5–8.1)

## 2019-12-15 LAB — CBC
HCT: 17.1 % — ABNORMAL LOW (ref 39.0–52.0)
Hemoglobin: 5.2 g/dL — CL (ref 13.0–17.0)
MCH: 28.4 pg (ref 26.0–34.0)
MCHC: 30.4 g/dL (ref 30.0–36.0)
MCV: 93.4 fL (ref 80.0–100.0)
Platelets: 313 10*3/uL (ref 150–400)
RBC: 1.83 MIL/uL — ABNORMAL LOW (ref 4.22–5.81)
RDW: 17.7 % — ABNORMAL HIGH (ref 11.5–15.5)
WBC: 19.4 10*3/uL — ABNORMAL HIGH (ref 4.0–10.5)
nRBC: 1 % — ABNORMAL HIGH (ref 0.0–0.2)

## 2019-12-15 LAB — URINALYSIS, ROUTINE W REFLEX MICROSCOPIC
Bilirubin Urine: NEGATIVE
Glucose, UA: NEGATIVE mg/dL
Hgb urine dipstick: NEGATIVE
Ketones, ur: NEGATIVE mg/dL
Leukocytes,Ua: NEGATIVE
Nitrite: NEGATIVE
Protein, ur: NEGATIVE mg/dL
Specific Gravity, Urine: 1.012 (ref 1.005–1.030)
pH: 5 (ref 5.0–8.0)

## 2019-12-15 LAB — PROTIME-INR
INR: 1.1 (ref 0.8–1.2)
Prothrombin Time: 13.9 seconds (ref 11.4–15.2)

## 2019-12-15 LAB — LIPASE, BLOOD: Lipase: 55 U/L — ABNORMAL HIGH (ref 11–51)

## 2019-12-15 LAB — RESPIRATORY PANEL BY RT PCR (FLU A&B, COVID)
Influenza A by PCR: NEGATIVE
Influenza B by PCR: NEGATIVE
SARS Coronavirus 2 by RT PCR: NEGATIVE

## 2019-12-15 LAB — PREPARE RBC (CROSSMATCH)

## 2019-12-15 LAB — POC OCCULT BLOOD, ED: Fecal Occult Bld: POSITIVE — AB

## 2019-12-15 MED ORDER — SODIUM CHLORIDE 0.9 % IV BOLUS
500.0000 mL | Freq: Once | INTRAVENOUS | Status: AC
  Administered 2019-12-15: 500 mL via INTRAVENOUS

## 2019-12-15 MED ORDER — LIDOCAINE HCL URETHRAL/MUCOSAL 2 % EX GEL
1.0000 "application " | Freq: Once | CUTANEOUS | Status: AC
  Administered 2019-12-15: 1 via TOPICAL
  Filled 2019-12-15: qty 11

## 2019-12-15 MED ORDER — SODIUM CHLORIDE 0.9 % IV SOLN
10.0000 mL/h | Freq: Once | INTRAVENOUS | Status: AC
  Administered 2019-12-15: 10 mL/h via INTRAVENOUS

## 2019-12-15 NOTE — ED Provider Notes (Signed)
[facility] EMERGENCY DEPARTMENT Provider Note   CSN: [id number] Arrival date & time: 12/15/19  1911     History Chief Complaint  Patient presents with   Constipation    Steven Vargas is a 84 y.o. male.  Patient with history of gastric ulcers, history of GI bleed due to suspected diverticulosis, enlarged prostate -- presents emergency department with complaint of intermittent upper abdominal pain over the past 2 months with associated dark stools.  Patient reports feeling like food gets stuck in his esophagus at times.  He states he has to induce vomiting and sometimes notes blood with the vomit.  Some days he will feel better but recently has been eating less and drinking less.  He states that he has had constipation over the past 2 weeks unrelieved with laxatives at home.  No reported fevers, chills.  He feels as though his abdomen is distended.  Reports that he is lost a lot of weight and that his pants have been fitting more loosely over the past 2 months.  Previous colonoscopy with polyps.  He states that his symptoms have gradually been worsening, prompting emergency department visit tonight.  He reports worsening fatigue, shortness of breath with exertion.        Past Medical History:  Diagnosis Date   Gastric ulcer    secondary to nsaid 10 years ago   GI bleed 04/04/2011   Hip pain, chronic    right   HLD (hyperlipidemia)    Hypertension    Obesity (BMI 35.0-39.9 without comorbidity) 03/2011   Stroke [facility])    2005    Patient Active Problem List   Diagnosis Date Noted   Diverticulosis of colon (without mention of hemorrhage) 04/06/2011   Benign neoplasm of colon 04/06/2011   Diverticulosis of colon with hemorrhage 04/06/2011   Diaphragmatic hernia without mention of obstruction or gangrene 04/06/2011   Hyperglycemia 04/05/2011   Syncope and collapse 04/05/2011   Ankle fracture, lateral malleolus, closed 04/05/2011   Acute  posthemorrhagic anemia 04/05/2011   Blood in stool 04/05/2011   Acute GI bleeding 04/05/2011    Past Surgical History:  Procedure Laterality Date   TONSILLECTOMY         Family History  Problem Relation Age of Onset   Stroke Mother    Kidney disease Brother    Diabetes Brother     Social History   Tobacco Use   Smoking status: Former Smoker   Smokeless tobacco: Never Used  Substance Use Topics   Alcohol use: Yes    Comment: occasional   Drug use: No    Home Medications Prior to Admission medications   Medication Sig Start Date End Date Taking? Authorizing Provider  amLODipine (NORVASC) 10 MG tablet Take 1 tablet (10 mg total) by mouth daily. 10/21/19   Wendie Agreste, MD  lisinopril-hydrochlorothiazide (ZESTORETIC) 10-12.5 MG tablet Take 1 tablet by mouth daily. 10/21/19   Wendie Agreste, MD    Allergies    Pravastatin  Review of Systems   Review of Systems  Constitutional: Positive for fatigue. Negative for fever.  HENT: Negative for rhinorrhea and sore throat.   Eyes: Negative for redness.  Respiratory: Negative for cough.   Cardiovascular: Negative for chest pain.  Gastrointestinal: Positive for abdominal pain, blood in stool, constipation and nausea. Negative for diarrhea and vomiting.  Genitourinary: Positive for difficulty urinating. Negative for dysuria and hematuria.  Musculoskeletal: Negative for myalgias.  Skin: Negative for rash.  Neurological: Negative for headaches.  Physical Exam Updated Vital Signs BP 134/86    Pulse (!) 110    Temp 98.9 F (37.2 C) (Oral)    Resp 18    Ht 5\' 6"  (1.676 m)    Wt 90 kg    SpO2 100%    BMI 32.02 kg/m   Physical Exam Vitals and nursing note reviewed. Exam conducted with a chaperone present.  Constitutional:      Appearance: He is well-developed.  HENT:     Head: Normocephalic and atraumatic.  Eyes:     General:        Right eye: No discharge.        Left eye: No discharge.      Conjunctiva/sclera: Conjunctivae normal.  Cardiovascular:     Rate and Rhythm: Normal rate and regular rhythm.     Heart sounds: Normal heart sounds.  Pulmonary:     Effort: Pulmonary effort is normal.     Breath sounds: Normal breath sounds.  Abdominal:     Palpations: Abdomen is soft.     Tenderness: There is abdominal tenderness. There is no guarding or rebound.     Comments: Mid-abdominal area of firmness, mid-line and right of mid-line extending into the lower abdomen.   Genitourinary:    Prostate: Enlarged and tender.     Rectum: Tenderness and external hemorrhoid present.     Comments: No stool ball. No grossly bloody or melanotic stool. Musculoskeletal:     Cervical back: Normal range of motion and neck supple.  Skin:    General: Skin is warm and dry.  Neurological:     Mental Status: He is alert.     ED Results / Procedures / Treatments   Labs (all labs ordered are listed, but only abnormal results are displayed) Labs Reviewed  LIPASE, BLOOD - Abnormal; Notable for the following components:      Result Value   Lipase 55 (*)    All other components within normal limits  COMPREHENSIVE METABOLIC PANEL - Abnormal; Notable for the following components:   Potassium 3.4 (*)    Glucose, Bld 156 (*)    BUN 50 (*)    Creatinine, Ser 2.02 (*)    Calcium 8.6 (*)    Total Protein 5.1 (*)    Albumin 2.8 (*)    Alkaline Phosphatase 37 (*)    GFR, Estimated 32 (*)    All other components within normal limits  CBC - Abnormal; Notable for the following components:   WBC 19.4 (*)    RBC 1.83 (*)    Hemoglobin 5.2 (*)    HCT 17.1 (*)    RDW 17.7 (*)    nRBC 1.0 (*)    All other components within normal limits  POC OCCULT BLOOD, ED - Abnormal; Notable for the following components:   Fecal Occult Bld POSITIVE (*)    All other components within normal limits  RESPIRATORY PANEL BY RT PCR (FLU A&B, COVID)  URINALYSIS, ROUTINE W REFLEX MICROSCOPIC  PROTIME-INR  TYPE AND SCREEN   PREPARE RBC (CROSSMATCH)    EKG None  Radiology CT ABDOMEN PELVIS WO CONTRAST  Result Date: 12/15/2019 CLINICAL DATA:  Gastrointestinal hemorrhage, diffuse abdominal pain, emesis EXAM: CT ABDOMEN AND PELVIS WITHOUT CONTRAST TECHNIQUE: Multidetector CT imaging of the abdomen and pelvis was performed following the standard protocol without IV contrast. COMPARISON:  None. FINDINGS: Lower chest: The visualized lung bases are clear save for a few insignificant pneumatoceles. Visualized heart and pericardium are unremarkable. Hepatobiliary: Multiple  simple cysts noted within the a hepatic dome. Liver otherwise unremarkable the gallbladder is contracted without pericholecystic inflammatory change. Punctate calcification within the gallbladder fossa may relate to remote or chronic inflammation. The extrahepatic bile duct is within normal limits for age. No intrahepatic biliary ductal dilation. Pancreas: Unremarkable Spleen: Fall unremarkable Adrenals/Urinary Tract: Bilateral adrenal adenoma are identified measuring 20 mm on the left and 22 mm on the right. The kidneys are normal in size and position. No intrarenal or ureteral calculi. Exophytic simple cortical cysts arise from the left kidney. There is moderate bilateral hydronephrosis and marked hydroureter. There is massive distention of the bladder with an estimated bladder volume of approximately 1800 cc. Stomach/Bowel: Severe sigmoid diverticulosis. Scattered diverticular seen throughout the remainder of the colon. The stomach, small bowel, and large bowel are otherwise unremarkable. Appendix normal. No free intraperitoneal gas or fluid. Vascular/Lymphatic: Moderate aortoiliac atherosclerotic calcification. No aneurysm. No pathologic adenopathy within the abdomen and pelvis Reproductive: Moderate prostatic enlargement. Seminal vesicles are unremarkable. Other: Small fat containing bilateral inguinal hernias are present. Rectum unremarkable. Musculoskeletal:  No acute bone abnormality. Bilateral L5 pars defects are present with grade 1 anterolisthesis of L5 upon S1. Degenerative changes are seen throughout the lumbar spine. IMPRESSION: Marked distension of the bladder and moderate bilateral hydronephrosis in keeping with changes of neurogenic bladder or bladder outlet obstruction, possibly related to prostatic hypertrophy. The estimated bladder volume is approximately 1800 cc. Contracted gallbladder demonstrating punctate foci of calcification within the gallbladder fossa possibly reflecting changes of chronic cholecystitis. This could be further assessed with dedicated sonography or hepatic biliary scintigraphy once the patient's acute issues have resolved. Bilateral adrenal adenoma. Aortic Atherosclerosis (ICD10-I70.0). Electronically Signed   By: Fidela Salisbury MD   On: 12/15/2019 22:27    Procedures Procedures (including critical care time)  Medications Ordered in ED Medications  0.9 %  sodium chloride infusion (has no administration in time range)  lidocaine (XYLOCAINE) 2 % jelly 1 application (has no administration in time range)  sodium chloride 0.9 % bolus 500 mL (0 mLs Intravenous Stopped 12/15/19 2309)    ED Course  I have reviewed the triage vital signs and the nursing notes.  Pertinent labs & imaging results that were available during my care of the patient were reviewed by me and considered in my medical decision making (see chart for details).  Patient seen and examined. Work-up initiated. Fluids ordered.    Vital signs reviewed and are as follows: BP 134/86    Pulse (!) 110    Temp 98.9 F (37.2 C) (Oral)    Resp 18    Ht 5\' 6"  (1.676 m)    Wt 90 kg    SpO2 100%    BMI 32.02 kg/m   CT imaging performed showing urinary retention and associated hydronephrosis.  Foley catheter ordered.  Foley catheter placed without complications.  Rectal exam performed.  Hemoccult positive.  Patient will need admission to hospital for GI bleeding  and blood transfusion.   11:22 PM 2300cc out of bladder -- bladder no longer palpable. Abd is soft. Pt doing well. States he feels fine.   11:28 PM Discussed with Dr. Hal Hope who will see patient.    CRITICAL CARE Performed by: Carlisle Cater PA-C Total critical care time: 35 minutes Critical care time was exclusive of separately billable procedures and treating other patients. Critical care was necessary to treat or prevent imminent or life-threatening deterioration. Critical care was time spent personally by me on the following activities: development  of treatment plan with patient and/or surrogate as well as nursing, discussions with consultants, evaluation of patient's response to treatment, examination of patient, obtaining history from patient or surrogate, ordering and performing treatments and interventions, ordering and review of laboratory studies, ordering and review of radiographic studies, pulse oximetry and re-evaluation of patient's condition.     MDM Rules/Calculators/A&P                          Admit.    Final Clinical Impression(s) / ED Diagnoses Final diagnoses:  Symptomatic anemia  Gastrointestinal hemorrhage, unspecified gastrointestinal hemorrhage type  Urinary retention  Acute kidney injury [facility])    Rx / DC Orders ED Discharge Orders    None       Carlisle Cater, PA-C 12/15/19 2328    Breck Coons, MD 12/16/19 [phone]

## 2019-12-15 NOTE — ED Notes (Signed)
Blood consent signed and placed at bedside

## 2019-12-15 NOTE — ED Triage Notes (Signed)
Patient arrived with EMS from home reports constipation for 2 weeks unrelieved by OTC laxatives , generalized abdominal pain with emesis , denies fever or chills .

## 2019-12-15 NOTE — ED Notes (Signed)
Admitting at bedside 

## 2019-12-15 NOTE — ED Notes (Signed)
Patient transported to CT 

## 2019-12-16 ENCOUNTER — Encounter (HOSPITAL_COMMUNITY): Payer: Self-pay | Admitting: Internal Medicine

## 2019-12-16 DIAGNOSIS — I10 Essential (primary) hypertension: Secondary | ICD-10-CM | POA: Diagnosis present

## 2019-12-16 DIAGNOSIS — K811 Chronic cholecystitis: Secondary | ICD-10-CM | POA: Diagnosis present

## 2019-12-16 DIAGNOSIS — R338 Other retention of urine: Secondary | ICD-10-CM | POA: Diagnosis not present

## 2019-12-16 DIAGNOSIS — Z8673 Personal history of transient ischemic attack (TIA), and cerebral infarction without residual deficits: Secondary | ICD-10-CM | POA: Diagnosis not present

## 2019-12-16 DIAGNOSIS — K922 Gastrointestinal hemorrhage, unspecified: Secondary | ICD-10-CM | POA: Diagnosis present

## 2019-12-16 DIAGNOSIS — N179 Acute kidney failure, unspecified: Secondary | ICD-10-CM | POA: Diagnosis present

## 2019-12-16 DIAGNOSIS — R310 Gross hematuria: Secondary | ICD-10-CM | POA: Diagnosis not present

## 2019-12-16 DIAGNOSIS — R195 Other fecal abnormalities: Secondary | ICD-10-CM | POA: Diagnosis not present

## 2019-12-16 DIAGNOSIS — D62 Acute posthemorrhagic anemia: Secondary | ICD-10-CM | POA: Diagnosis present

## 2019-12-16 DIAGNOSIS — N401 Enlarged prostate with lower urinary tract symptoms: Secondary | ICD-10-CM | POA: Diagnosis present

## 2019-12-16 DIAGNOSIS — Z823 Family history of stroke: Secondary | ICD-10-CM | POA: Diagnosis not present

## 2019-12-16 DIAGNOSIS — R1310 Dysphagia, unspecified: Secondary | ICD-10-CM | POA: Diagnosis present

## 2019-12-16 DIAGNOSIS — N1339 Other hydronephrosis: Secondary | ICD-10-CM | POA: Diagnosis present

## 2019-12-16 DIAGNOSIS — R3916 Straining to void: Secondary | ICD-10-CM | POA: Diagnosis present

## 2019-12-16 DIAGNOSIS — I959 Hypotension, unspecified: Secondary | ICD-10-CM | POA: Diagnosis present

## 2019-12-16 DIAGNOSIS — R7303 Prediabetes: Secondary | ICD-10-CM | POA: Diagnosis present

## 2019-12-16 DIAGNOSIS — E669 Obesity, unspecified: Secondary | ICD-10-CM | POA: Diagnosis present

## 2019-12-16 DIAGNOSIS — K5900 Constipation, unspecified: Secondary | ICD-10-CM | POA: Diagnosis present

## 2019-12-16 DIAGNOSIS — Z87891 Personal history of nicotine dependence: Secondary | ICD-10-CM | POA: Diagnosis not present

## 2019-12-16 DIAGNOSIS — S3729XA Other injury of bladder, initial encounter: Secondary | ICD-10-CM | POA: Diagnosis not present

## 2019-12-16 DIAGNOSIS — K2900 Acute gastritis without bleeding: Secondary | ICD-10-CM | POA: Diagnosis not present

## 2019-12-16 DIAGNOSIS — Y846 Urinary catheterization as the cause of abnormal reaction of the patient, or of later complication, without mention of misadventure at the time of the procedure: Secondary | ICD-10-CM | POA: Diagnosis not present

## 2019-12-16 DIAGNOSIS — K921 Melena: Secondary | ICD-10-CM | POA: Diagnosis not present

## 2019-12-16 DIAGNOSIS — N320 Bladder-neck obstruction: Secondary | ICD-10-CM | POA: Diagnosis present

## 2019-12-16 DIAGNOSIS — E785 Hyperlipidemia, unspecified: Secondary | ICD-10-CM | POA: Diagnosis not present

## 2019-12-16 DIAGNOSIS — K259 Gastric ulcer, unspecified as acute or chronic, without hemorrhage or perforation: Secondary | ICD-10-CM | POA: Diagnosis not present

## 2019-12-16 DIAGNOSIS — Y92239 Unspecified place in hospital as the place of occurrence of the external cause: Secondary | ICD-10-CM | POA: Diagnosis not present

## 2019-12-16 DIAGNOSIS — E876 Hypokalemia: Secondary | ICD-10-CM | POA: Diagnosis not present

## 2019-12-16 DIAGNOSIS — Z6832 Body mass index (BMI) 32.0-32.9, adult: Secondary | ICD-10-CM | POA: Diagnosis not present

## 2019-12-16 DIAGNOSIS — Z20822 Contact with and (suspected) exposure to covid-19: Secondary | ICD-10-CM | POA: Diagnosis present

## 2019-12-16 DIAGNOSIS — K2901 Acute gastritis with bleeding: Secondary | ICD-10-CM | POA: Diagnosis present

## 2019-12-16 LAB — CBC WITH DIFFERENTIAL/PLATELET
Abs Immature Granulocytes: 0.45 10*3/uL — ABNORMAL HIGH (ref 0.00–0.07)
Basophils Absolute: 0 10*3/uL (ref 0.0–0.1)
Basophils Relative: 0 %
Eosinophils Absolute: 0 10*3/uL (ref 0.0–0.5)
Eosinophils Relative: 0 %
HCT: 25 % — ABNORMAL LOW (ref 39.0–52.0)
Hemoglobin: 8 g/dL — ABNORMAL LOW (ref 13.0–17.0)
Immature Granulocytes: 2 %
Lymphocytes Relative: 10 %
Lymphs Abs: 1.8 10*3/uL (ref 0.7–4.0)
MCH: 29.9 pg (ref 26.0–34.0)
MCHC: 32 g/dL (ref 30.0–36.0)
MCV: 93.3 fL (ref 80.0–100.0)
Monocytes Absolute: 1.3 10*3/uL — ABNORMAL HIGH (ref 0.1–1.0)
Monocytes Relative: 7 %
Neutro Abs: 14.8 10*3/uL — ABNORMAL HIGH (ref 1.7–7.7)
Neutrophils Relative %: 81 %
Platelets: 190 10*3/uL (ref 150–400)
RBC: 2.68 MIL/uL — ABNORMAL LOW (ref 4.22–5.81)
RDW: 16.7 % — ABNORMAL HIGH (ref 11.5–15.5)
WBC: 18.4 10*3/uL — ABNORMAL HIGH (ref 4.0–10.5)
nRBC: 0.7 % — ABNORMAL HIGH (ref 0.0–0.2)

## 2019-12-16 LAB — CBG MONITORING, ED
Glucose-Capillary: 116 mg/dL — ABNORMAL HIGH (ref 70–99)
Glucose-Capillary: 122 mg/dL — ABNORMAL HIGH (ref 70–99)

## 2019-12-16 LAB — BASIC METABOLIC PANEL
Anion gap: 9 (ref 5–15)
BUN: 46 mg/dL — ABNORMAL HIGH (ref 8–23)
CO2: 25 mmol/L (ref 22–32)
Calcium: 7.8 mg/dL — ABNORMAL LOW (ref 8.9–10.3)
Chloride: 110 mmol/L (ref 98–111)
Creatinine, Ser: 1.97 mg/dL — ABNORMAL HIGH (ref 0.61–1.24)
GFR, Estimated: 33 mL/min — ABNORMAL LOW (ref >60)
Glucose, Bld: 131 mg/dL — ABNORMAL HIGH (ref 70–99)
Potassium: 3.8 mmol/L (ref 3.5–5.1)
Sodium: 144 mmol/L (ref 135–145)

## 2019-12-16 LAB — LIPASE, BLOOD: Lipase: 52 U/L — ABNORMAL HIGH (ref 11–51)

## 2019-12-16 LAB — GLUCOSE, CAPILLARY
Glucose-Capillary: 100 mg/dL — ABNORMAL HIGH (ref 70–99)
Glucose-Capillary: 127 mg/dL — ABNORMAL HIGH (ref 70–99)

## 2019-12-16 MED ORDER — HYDRALAZINE HCL 20 MG/ML IJ SOLN: 10.0000 mg | INTRAMUSCULAR | Status: DC | PRN

## 2019-12-16 MED ORDER — CHLORHEXIDINE GLUCONATE CLOTH 2 % EX PADS
6.0000 | MEDICATED_PAD | Freq: Every day | CUTANEOUS | Status: DC
  Administered 2019-12-16 – 2019-12-20 (×5): 6 via TOPICAL

## 2019-12-16 MED ORDER — SODIUM CHLORIDE 0.9 % IV SOLN
8.0000 mg/h | INTRAVENOUS | Status: DC
  Administered 2019-12-16 – 2019-12-18 (×7): 8 mg/h via INTRAVENOUS
  Filled 2019-12-16 (×10): qty 80

## 2019-12-16 MED ORDER — TAMSULOSIN HCL 0.4 MG PO CAPS
0.4000 mg | ORAL_CAPSULE | Freq: Every day | ORAL | Status: DC
  Administered 2019-12-16 – 2019-12-20 (×5): 0.4 mg via ORAL
  Filled 2019-12-16 (×6): qty 1

## 2019-12-16 MED ORDER — SODIUM CHLORIDE 0.9 % IV SOLN: INTRAVENOUS | Status: DC

## 2019-12-16 MED ORDER — ACETAMINOPHEN 325 MG PO TABS
650.0000 mg | ORAL_TABLET | Freq: Four times a day (QID) | ORAL | Status: DC | PRN
  Administered 2019-12-16: 650 mg via ORAL
  Filled 2019-12-16: qty 2

## 2019-12-16 MED ORDER — PANTOPRAZOLE SODIUM 40 MG IV SOLR: 40.0000 mg | Freq: Two times a day (BID) | INTRAVENOUS | Status: DC

## 2019-12-16 MED ORDER — PIPERACILLIN-TAZOBACTAM 3.375 G IVPB 30 MIN
3.3750 g | Freq: Once | INTRAVENOUS | Status: AC
  Administered 2019-12-16: 3.375 g via INTRAVENOUS
  Filled 2019-12-16: qty 50

## 2019-12-16 MED ORDER — PIPERACILLIN-TAZOBACTAM 3.375 G IVPB
3.3750 g | Freq: Three times a day (TID) | INTRAVENOUS | Status: DC
  Administered 2019-12-16 – 2019-12-20 (×11): 3.375 g via INTRAVENOUS
  Filled 2019-12-16 (×16): qty 50

## 2019-12-16 MED ORDER — ACETAMINOPHEN 650 MG RE SUPP: 650.0000 mg | Freq: Four times a day (QID) | RECTAL | Status: DC | PRN

## 2019-12-16 MED ORDER — SODIUM CHLORIDE 0.9 % IV SOLN
80.0000 mg | Freq: Once | INTRAVENOUS | Status: AC
  Administered 2019-12-16: 80 mg via INTRAVENOUS
  Filled 2019-12-16 (×2): qty 80

## 2019-12-16 NOTE — Anesthesia Preprocedure Evaluation (Addendum)
Anesthesia Evaluation  Patient identified by MRN, date of birth, ID band Patient awake    Reviewed: Allergy & Precautions, NPO status , Patient's Chart, lab work & pertinent test results  Airway Mallampati: II  TM Distance: >3 FB Neck ROM: Full    Dental no notable dental hx. (+) Teeth Intact, Dental Advisory Given   Pulmonary former smoker,    Pulmonary exam normal breath sounds clear to auscultation       Cardiovascular hypertension, Pt. on medications Normal cardiovascular exam Rhythm:Regular Rate:Normal     Neuro/Psych  Neuromuscular disease CVA    GI/Hepatic PUD,   Endo/Other    Renal/GU Renal InsufficiencyRenal diseaseCr 1.97 K+ 3.9     Musculoskeletal negative musculoskeletal ROS (+)   Abdominal (+) + obese,   Peds  Hematology  (+) anemia , hgb 8.0   Anesthesia Other Findings   Reproductive/Obstetrics                            Anesthesia Physical Anesthesia Plan  ASA: III  Anesthesia Plan: MAC   Post-op Pain Management:    Induction: Intravenous  PONV Risk Score and Plan:   Airway Management Planned: Natural Airway and Nasal Cannula  Additional Equipment: None  Intra-op Plan:   Post-operative Plan:   Informed Consent: I have reviewed the patients History and Physical, chart, labs and discussed the procedure including the risks, benefits and alternatives for the proposed anesthesia with the patient or authorized representative who has indicated his/her understanding and acceptance.     Dental advisory given  Plan Discussed with:   Anesthesia Plan Comments:        Anesthesia Quick Evaluation

## 2019-12-16 NOTE — Progress Notes (Signed)
Pharmacy Antibiotic Note  Steven Vargas is a 84 y.o. male admitted on 12/15/2019 with intra-abdominal infection.  Pharmacy has been consulted for Zosyn dosing.  Plan: Zosyn 3.375g IV q8h (4 hour infusion).  Height: 5\' 6"  (167.6 cm) Weight: 90 kg (198 lb 6.6 oz) IBW/kg (Calculated) : 63.8  Temp (24hrs), Avg:98.9 F (37.2 C), Min:98.6 F (37 C), Max:99 F (37.2 C)  Recent Labs  Lab 12/15/19 1949  WBC 19.4*  CREATININE 2.02*    Estimated Creatinine Clearance: 29.6 mL/min (A) (by C-G formula based on SCr of 2.02 mg/dL (H)).    Allergies  Allergen Reactions  . Pravastatin Swelling and Other (See Comments)    Reported jaw and lip swelling along with myalgias in the back with pravastatin.  Symptoms resolved with stopping.     Thank you for allowing pharmacy to be a part of this patient's care.  Wynona Neat, PharmD, BCPS  12/16/2019 4:02 AM

## 2019-12-16 NOTE — ED Notes (Addendum)
Pt to room 40. Foley cath with frank "ruby red" bloody urine. MD notified. Pt denies any problems, requesting broth and jello. Dietary at bedside delivering clear liquid tray now.

## 2019-12-16 NOTE — Plan of Care (Signed)

## 2019-12-16 NOTE — Progress Notes (Signed)
PROGRESS NOTE  Steven Vargas  DOB: Feb 24, 1935  PCP: Wendie Agreste, MD HGD:[id number]  DOA: 12/15/2019  LOS: 0 days   Chief Complaint  Patient presents with  . Constipation   Brief narrative: Steven Vargas is a 84 y.o. male who presented to the ED on 12/15/2019 with worsening epigastric pain, constipation, blood in stool and progressive weakness for last 3 weeks. 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 Over the last several weeks, patient had melanotic stool, epigastric pain particularly after meals, dysphagia to solids.  He feels food stuck and then induces vomiting as a result.  He noticed a small amount of red blood after inducing emesis 3 weeks ago.  Denies any unexplained weight loss. Not on blood thinners.  Takes ibuprofen infrequently.  Reports history of constipation and sometimes requires manual disimpaction.  He has noticed small onset bright red blood per rectum after disimpaction with stools. Of note he had EGD and colonoscopy in 2013 for hematochezia and history of gastric ulcer.  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  Labs on admission showed hemoglobin of 5.6, WBC count of 19.4, creatinine elevated 2.02 from baseline 1.12 Fecal occult blood test positive. CT abdomen pelvis showed  -Marked distention of urinary bladder and moderate bilateral hydronephrosis concerning for neurogenic bladder versus bladder outlet obstruction.  -features concerning for chronic cholecystitis.   He was given 2 units of PRBC transfusion.  Started on PPI drip.  GI consulted Admitted to hospitalist service  Of note, a Foley catheter was inserted in the ED which relieved 2.5 L of yellow-colored urine. Next morning, 550 mL of bloody urine was noted in indwelling catheter.    Subjective: Patient was seen and examined  this morning in the ED.  Waiting for inpatient bed availability.  Not in distress. Chart reviewed.  No fever tachycardia between 100 and 110 bpm, blood pressure up in 150s mostly Repeat labs this morning with creatinine slightly down to 1.97  Assessment/Plan: Acute anemia -Hemoglobin 5.2, likely from GI bleeding. -Underwent 2 units of PRBC transfusion.  Hemoglobin improved to 8 this morning. Recent Labs    12/15/19 1949 12/16/19 0739  HGB 5.2* 8.0*  MCV 93.4 93.3   Acute GI bleeding -Suspect upper based on history of epigastric pain, one episode of hematemesis 3 weeks ago. -Currently on PPI drip. -GI eval appreciated.  Tentative plan for EGD tomorrow.  Clear liquid diet with n.p.o. after midnight.  AKI -Baseline creatinine 1.1, presented with a creatinine of 2.02. -Prerenal from anemia.  May also have component of postrenal etiology because of urinary obstruction. -Continue to monitor creatinine Recent Labs    04/20/19 1126 10/21/19 1113 12/15/19 1949 12/16/19 0739  BUN 17 13 50* 46*  CREATININE 1.05 1.10 2.02* 1.97*   Acute urinary retention Hematuria -Patient reports history of lower urinary tract symptoms for several years but no established diagnosis of BPH -CT scan of abdomen finding as above showing urinary bladder distention and bilateral hydronephrosis.  Foley catheter inserted.   -2.5 L of clear yellow urine was drained initially in the ED.  Later this morning, 550 mL of blood-tinged urine was drained as well.  Likely Foley catheter related trauma.   -Start Flomax. -Outpatient urology follow-up.  Leukocytosis Possible chronic cholecystitis seen in the CAT scan.   -Likely will need HIDA scan and further imaging and input from GI. Currently on empiric  antibiotics for now given the patient has leukocytosis.  Essential hypertension  -Home meds include amlodipine 10 mg daily, lisinopril 10 mg daily and hydrochlorothiazide 12.5 mg daily.  -Currently on hold.  Blood  pressure observing this morning.  I will resume amlodipine and keep others on hold because of AKI.  -Continue IV hydralazine as needed.   History of prediabetes  -A1c 5.9 on 9/22. -we will follow CBGs closely. Recent Labs  Lab 12/16/19 0712 12/16/19 1212  GLUCAP 116* 122*   Mobility: At home, he was able to ambulate without a walker or cane. Code Status:   Code Status: Full Code  Nutritional status: Body mass index is 32.02 kg/m.     Diet Order            Diet NPO time specified  Diet effective midnight           Diet clear liquid Room service appropriate? Yes; Fluid consistency: Thin  Diet effective now                 DVT prophylaxis: SCDs Start: 12/16/19 0123   Antimicrobials:  Zosyn IV Fluid: Normal saline at 29 mill per hour Consultants: GI Family Communication:  Family not at bedside  Status is: Observation  The patient will require care spanning > 2 midnights and should be moved to inpatient because: Ongoing GI bleeding, blood transfusion, AKI  Dispo: The patient is from: Home              Anticipated d/c is to: Home              Anticipated d/c date is: 3 days              Patient currently is not medically stable to d/c.       Infusions:  . sodium chloride 75 mL/hr at 12/16/19 1100  . pantoprozole (PROTONIX) infusion 8 mg/hr (12/16/19 1107)  . piperacillin-tazobactam (ZOSYN)  IV 3.375 g (12/16/19 0941)    Scheduled Meds: . [START ON 12/19/2019] pantoprazole  40 mg Intravenous Q12H  . tamsulosin  0.4 mg Oral Daily    Antimicrobials: Anti-infectives (From admission, onward)   Start     Dose/Rate Route Frequency Ordered Stop   12/16/19 1000  piperacillin-tazobactam (ZOSYN) IVPB 3.375 g        3.375 g 12.5 mL/hr over 240 Minutes Intravenous Every 8 hours 12/16/19 0403     12/16/19 0415  piperacillin-tazobactam (ZOSYN) IVPB 3.375 g        3.375 g 100 mL/hr over 30 Minutes Intravenous  Once 12/16/19 0403 12/16/19 0519      PRN  meds: acetaminophen **OR** acetaminophen, hydrALAZINE   Objective: Vitals:   12/16/19 1218 12/16/19 1245  BP: 140/77 138/81  Pulse: 87 93  Resp: 15 (!) 22  Temp:    SpO2: 100% 100%    Intake/Output Summary (Last 24 hours) at 12/16/2019 1317 Last data filed at 12/16/2019 0755 Gross per 24 hour  Intake 3222.28 ml  Output 3050 ml  Net 172.28 ml   Filed Weights   12/15/19 1943  Weight: 90 kg   Weight change:  Body mass index is 32.02 kg/m.   Physical Exam: General exam: Appears calm and comfortable.  Not in physical distress Skin: No rashes, lesions or ulcers. HEENT: Atraumatic, normocephalic, supple neck, no obvious bleeding Lungs: Clear to auscultation bilaterally CVS: Regular rate and rhythm, no murmur GI/Abd soft, nontender, nondistended, bowel sound present CNS: Alert, awake, oriented x3 Psychiatry: Mood appropriate Extremities:  No pedal edema, no calf tenderness  Data Review: I have personally reviewed the laboratory data and studies available.  Recent Labs  Lab 12/15/19 1949 12/16/19 0739  WBC 19.4* 18.4*  NEUTROABS  --  14.8*  HGB 5.2* 8.0*  HCT 17.1* 25.0*  MCV 93.4 93.3  PLT 313 190   Recent Labs  Lab 12/15/19 1949 12/16/19 0739  NA 141 144  K 3.4* 3.8  CL 105 110  CO2 26 25  GLUCOSE 156* 131*  BUN 50* 46*  CREATININE 2.02* 1.97*  CALCIUM 8.6* 7.8*    F/u labs ordered.  Signed, Terrilee Croak, MD [facility] 12/16/2019

## 2019-12-16 NOTE — ED Notes (Signed)
Pt completed blood transfusion , no adverse reactions noted, vss on ccm. Pt calm and nondistressed. Call bell in reach.

## 2019-12-16 NOTE — ED Notes (Signed)
Pt reporting belching and abd pain, sttaes its something like acid reflux, vss on monitor, pt remains in sinus rhythm, and nadn. Side rails up, call bell provided, MD noified.

## 2019-12-16 NOTE — H&P (View-Only) (Signed)
Referring Provider:Dr. Terrilee Croak Primary Care Physician:  Wendie Agreste, MD Primary Gastroenterologist:  Althia Forts  Reason for Consultation:  Upper GI bleeding  HPI: Steven Vargas is a 84 y.o. male with history of PUD (NSAID-induced, >10 years ago), presumed diverticular bleeding, and stroke (2005) presenting for consultation of suspected upper GI bleeding.  Patient reports melenic stools over the past several weeks.  He has also had epigastric pain, particularly after meals.  He also notes dysphagia to solids over the past few weeks, stating he is felt like he has food stuck, and then induces vomiting as a result.  He states he has noticed a small amount of red blood after induction of emesis.  Denies changes in appetite or unexplained weight loss.  Patient also reports constipation, stating he has to manually disimpact stools at times.  He has noted small amounts of bright red blood per rectum after disimpaction of stools.  Denies any blood thinner or aspirin use.  Occasionally takes ibuprofen, though he states this is infrequent.  He took 1 ibuprofen this week.  Patient was found to be anemic on arrival to ED.  He denies any fatigue, chest pain, shortness of breath, syncope.  Denies any family history of colon cancer or gastrointestinal malignancy.  Patient had EGD/colonoscopy in 2013 for hematochezia and history of gastric ulcer.  EGD 03/2011: Deformity in the bulb/descending duodenum and duodenal diverticulum  Colonoscopy 03/2011: one 27mm and one 57mm polyps in ascending colon (bx: tubular adenoma), 40mm polyp in transverse colon  Past Medical History:  Diagnosis Date  . Gastric ulcer    secondary to nsaid 10 years ago  . GI bleed 04/04/2011  . Hip pain, chronic    right  . HLD (hyperlipidemia)   . Hypertension   . Obesity (BMI 35.0-39.9 without comorbidity) 03/2011  . Stroke [facility])    2005    Past Surgical History:  Procedure Laterality Date  . TONSILLECTOMY      Prior  to Admission medications   Medication Sig Start Date End Date Taking? Authorizing Provider  amLODipine (NORVASC) 10 MG tablet Take 1 tablet (10 mg total) by mouth daily. 10/21/19  Yes Wendie Agreste, MD  ibuprofen (ADVIL) 200 MG tablet Take 200 mg by mouth every 6 (six) hours as needed for moderate pain.   Yes [provider]  lisinopril-hydrochlorothiazide (ZESTORETIC) 10-12.5 MG tablet Take 1 tablet by mouth daily. 10/21/19  Yes Wendie Agreste, MD    Scheduled Meds: . [START ON 12/19/2019] pantoprazole  40 mg Intravenous Q12H  . tamsulosin  0.4 mg Oral Daily   Continuous Infusions: . sodium chloride 75 mL/hr at 12/16/19 0147  . pantoprozole (PROTONIX) infusion 8 mg/hr (12/16/19 0208)  . piperacillin-tazobactam (ZOSYN)  IV     PRN Meds:.acetaminophen **OR** acetaminophen, hydrALAZINE  Allergies as of 12/15/2019 - Review Complete 12/15/2019  Allergen Reaction Noted  . Pravastatin Swelling and Other (See Comments) 10/20/2018    Family History  Problem Relation Age of Onset  . Stroke Mother   . Kidney disease Brother   . Diabetes Brother     Social History   Socioeconomic History  . Marital status: Married    Spouse name: Not on file  . Number of children: Not on file  . Years of education: Not on file  . Highest education level: Not on file  Occupational History  . Not on file  Tobacco Use  . Smoking status: Former Research scientist (life sciences)  . Smokeless tobacco: Never Used  Substance and  Sexual Activity  . Alcohol use: Yes    Comment: occasional  . Drug use: No  . Sexual activity: Not Currently  Other Topics Concern  . Not on file  Social History Narrative   Lives in [place], [place] with his wife and son. 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 Drinks 2-3 beers once every few weeks. Smoking 2 years ago. Used to smoke 2-3 packs per day before that for past many years. Never did illicit drugs. Has [employer] health care Medicare.   Social Determinants of  Health   Financial Resource Strain:   . Difficulty of Paying Living Expenses: Not on file  Food Insecurity:   . Worried About Charity fundraiser in the Last Year: Not on file  . Ran Out of Food in the Last Year: Not on file  Transportation Needs:   . Lack of Transportation (Medical): Not on file  . Lack of Transportation (Non-Medical): Not on file  Physical Activity:   . Days of Exercise per Week: Not on file  . Minutes of Exercise per Session: Not on file  Stress:   . Feeling of Stress : Not on file  Social Connections:   . Frequency of Communication with Friends and Family: Not on file  . Frequency of Social Gatherings with Friends and Family: Not on file  . Attends Religious Services: Not on file  . Active Member of Clubs or Organizations: Not on file  . Attends Archivist Meetings: Not on file  . Marital Status: Not on file  Intimate Partner Violence:   . Fear of Current or Ex-Partner: Not on file  . Emotionally Abused: Not on file  . Physically Abused: Not on file  . Sexually Abused: Not on file    Review of Systems: Review of Systems  Constitutional: Negative for chills, fever, malaise/fatigue and weight loss.  HENT: Negative for sore throat.   Eyes: Negative for pain and redness.  Respiratory: Negative for cough, shortness of breath and stridor.   Cardiovascular: Negative for chest pain and palpitations.  Gastrointestinal: Positive for abdominal pain, blood in stool, constipation, melena, nausea and vomiting. Negative for diarrhea and heartburn.  Genitourinary: Negative for dysuria and flank pain.  Musculoskeletal: Negative for falls and neck pain.  Neurological: Negative for seizures and loss of consciousness.  Endo/Heme/Allergies: Negative for polydipsia. Does not bruise/bleed easily.  Psychiatric/Behavioral: Negative for substance abuse. The patient is not nervous/anxious.      Physical Exam: Vital signs: Vitals:   12/16/19 0713 12/16/19 0815  BP:  (!) 158/88 (!) 157/87  Pulse: 95 88  Resp: 20 19  Temp: 98.8 F (37.1 C)   SpO2: 100% 100%      Physical Exam Vitals reviewed.  Constitutional:      General: He is not in acute distress.    Appearance: He is overweight.  HENT:     Head: Normocephalic and atraumatic.     Nose: Nose normal. No congestion.     Mouth/Throat:     Mouth: Mucous membranes are moist.     Pharynx: Oropharynx is clear.  Eyes:     General: No scleral icterus.    Extraocular Movements: Extraocular movements intact.     Comments: Conjunctival pallor  Cardiovascular:     Rate and Rhythm: Normal rate and regular rhythm.     Pulses: Normal pulses.  Pulmonary:     Effort: Pulmonary effort is normal. No respiratory distress.     Breath sounds:  Normal breath sounds.  Abdominal:     General: Bowel sounds are normal. There is no distension.     Palpations: Abdomen is soft. There is no mass.     Tenderness: There is no abdominal tenderness. There is no guarding or rebound.     Hernia: No hernia is present.  Genitourinary:    Comments: Foley catheter in place with bloody output Musculoskeletal:        General: No swelling or tenderness.     Cervical back: Normal range of motion and neck supple.  Skin:    General: Skin is warm and dry.  Neurological:     General: No focal deficit present.     Mental Status: He is oriented to person, place, and time. He is lethargic.  Psychiatric:        Mood and Affect: Mood normal.        Behavior: Behavior normal. Behavior is cooperative.      GI:  Lab Results: Recent Labs    12/15/19 1949 12/16/19 0739  WBC 19.4* 18.4*  HGB 5.2* 8.0*  HCT 17.1* 25.0*  PLT 313 190   BMET Recent Labs    12/15/19 1949 12/16/19 0739  NA 141 144  K 3.4* 3.8  CL 105 110  CO2 26 25  GLUCOSE 156* 131*  BUN 50* 46*  CREATININE 2.02* 1.97*  CALCIUM 8.6* 7.8*   LFT Recent Labs    12/15/19 1949  PROT 5.1*  ALBUMIN 2.8*  AST 15  ALT 26  ALKPHOS 37*  BILITOT 0.4    PT/INR Recent Labs    12/15/19 2136  LABPROT 13.9  INR 1.1     Studies/Results: CT ABDOMEN PELVIS WO CONTRAST  Result Date: 12/15/2019 CLINICAL DATA:  Gastrointestinal hemorrhage, diffuse abdominal pain, emesis EXAM: CT ABDOMEN AND PELVIS WITHOUT CONTRAST TECHNIQUE: Multidetector CT imaging of the abdomen and pelvis was performed following the standard protocol without IV contrast. COMPARISON:  None. FINDINGS: Lower chest: The visualized lung bases are clear save for a few insignificant pneumatoceles. Visualized heart and pericardium are unremarkable. Hepatobiliary: Multiple simple cysts noted within the a hepatic dome. Liver otherwise unremarkable the gallbladder is contracted without pericholecystic inflammatory change. Punctate calcification within the gallbladder fossa may relate to remote or chronic inflammation. The extrahepatic bile duct is within normal limits for age. No intrahepatic biliary ductal dilation. Pancreas: Unremarkable Spleen: Fall unremarkable Adrenals/Urinary Tract: Bilateral adrenal adenoma are identified measuring 20 mm on the left and 22 mm on the right. The kidneys are normal in size and position. No intrarenal or ureteral calculi. Exophytic simple cortical cysts arise from the left kidney. There is moderate bilateral hydronephrosis and marked hydroureter. There is massive distention of the bladder with an estimated bladder volume of approximately 1800 cc. Stomach/Bowel: Severe sigmoid diverticulosis. Scattered diverticular seen throughout the remainder of the colon. The stomach, small bowel, and large bowel are otherwise unremarkable. Appendix normal. No free intraperitoneal gas or fluid. Vascular/Lymphatic: Moderate aortoiliac atherosclerotic calcification. No aneurysm. No pathologic adenopathy within the abdomen and pelvis Reproductive: Moderate prostatic enlargement. Seminal vesicles are unremarkable. Other: Small fat containing bilateral inguinal hernias are present.  Rectum unremarkable. Musculoskeletal: No acute bone abnormality. Bilateral L5 pars defects are present with grade 1 anterolisthesis of L5 upon S1. Degenerative changes are seen throughout the lumbar spine. IMPRESSION: Marked distension of the bladder and moderate bilateral hydronephrosis in keeping with changes of neurogenic bladder or bladder outlet obstruction, possibly related to prostatic hypertrophy. The estimated bladder volume is approximately 1800  cc. Contracted gallbladder demonstrating punctate foci of calcification within the gallbladder fossa possibly reflecting changes of chronic cholecystitis. This could be further assessed with dedicated sonography or hepatic biliary scintigraphy once the patient's acute issues have resolved. Bilateral adrenal adenoma. Aortic Atherosclerosis (ICD10-I70.0). Electronically Signed   By: Fidela Salisbury MD   On: 12/15/2019 22:27   DG Chest 2 View  Result Date: 12/15/2019 CLINICAL DATA:  GI bleeding, concern for malignancy, evaluate pulmonary nodules EXAM: CHEST - 2 VIEW COMPARISON:  Insert agree CT abdomen pelvis 12/15/2019, radiograph 04/04/2011 FINDINGS: Low lung volumes. Vascular crowding with ectatic changes. No discernible pulmonary nodules or masses. Mild vascular congestion without frank edema. No consolidative opacity. Prominent cardiac silhouette though possibly accentuated by technique. The aorta is calcified. The remaining cardiomediastinal contours are unremarkable. No acute osseous or soft tissue abnormality. Degenerative changes are present in the imaged spine and shoulders. Telemetry leads overlie the chest. IMPRESSION: Low lung volumes, atelectasis and vascular congestion without frank edema. No discerning pulmonary nodules or masses though evaluation limited on portable radiographic technique. These results and limitations were discussed by telephone at the time of interpretation on 12/15/2019 at 10:32 pm to provider [facility] , who verbally  acknowledged these results. Electronically Signed   By: Lovena Le M.D.   On: 12/15/2019 22:32    Impression: Suspected upper GI bleeding: Melena x2 to 3 weeks, small-volume hematemesis after induction of vomiting, epigastric abdominal pain, history of peptic ulcer.  Differential diagnosis includes PUD versus gastritis versus esophagitis versus mass/lesion. -Hemoglobin 5.2 on arrival yesterday.  Hemoglobin currently 8.0 after 2u pRBCs.  Last hemoglobin on file was 15.6 in 12/2015. -INR 1.1 -Hemodynamically stable.  Normotensive to hypertensive, normal heart rate  AKI: BUN 46/Cr 1.97  ?chronic cholecystitis: CT 12/15/19: contracted gallbladder demonstrating punctate foci of calcification within the gallbladder fossa possibly reflecting changes of chronic cholecystitis.  -Normal LFTs  Plan: EGD tomorrow.  I thoroughly discussed the procedure with the patient to include nature, alternatives, benefits, and risks (including but not limited to bleeding, infection, perforation, anesthesia/cardiac and pulmonary complications).  Patient verbalized understanding and gave verbal consent to proceed with EGD.  Also discussed possibility of colonoscopy, due to small amounts of bright red blood per rectum and history of adenomatous polyps.  Patient prefers to defer colonoscopy until after EGD, which I think is reasonable.  If no source of bleeding is identified on EGD, we will plan for inpatient colonoscopy.  Continue Protonix drip.  Clear liquid diet with NPO after midnight.  Continue to monitor H&H with transfusion as needed to maintain hemoglobin greater than 7.  If epigastric abdominal pain and nausea/vomiting persist despite treatment of presumed gastric source, recommend HIDA scan for possible chronic cholecystitis.  [facility] will follow.   LOS: 0 days   Salley Slaughter  PA-C 12/16/2019, 8:49 AM  Contact #  [phone]

## 2019-12-16 NOTE — ED Notes (Signed)
Coming on shift, bloody urine noted in pt indwelling cath, 550 ml blood tinged urine. This nurse notified Dahal MD attending . No new orders at this time. Will continue to monitor.

## 2019-12-16 NOTE — H&P (Signed)
History and Physical    Steven Vargas BJS:[id number] DOB: 06/01/1935 DOA: 12/15/2019  PCP: Wendie Agreste, MD  Patient coming from: Home.  Chief Complaint: Epigastric pain and blood in the stools.  HPI: Steven Vargas is a 84 y.o. male with history of hypertension, hyperlipidemia, prediabetes stroke presents to the ER because of worsening epigastric pain with some nausea over the last 3 weeks.  Has been having some constipation but over the last 3 weeks has noticed increasing blood in the stool which patient is not able to exactly characterize.  Patient states after many years he had some beer 3 weeks ago following which his symptoms started with epigastric discomfort.  He at times had to throw up to make his epigastric discomfort relieved and has noticed some blood in the vomitus.  He also eventually started noticing blood in the stool at times dark.  Does not take NSAIDs every time but due to pain he did take 1 dose 4 days ago.  Not on any antiplatelet agents.  Although last few days he also has been feeling weak and tired fatigued no fever or chills.  And for the last couple of years he has to strain and stand when he had to urinate.  ED Course: In the ER patient was initially mildly hypotensive improved with fluids and lab work was significant for hemoglobin of 5.6 WBC of 19.4 creatinine of 2.02 with increased from 1.12 months ago lipase is mildly elevated at 55.  CT abdomen pelvis is significant for distended urinary bladder concerning for neurogenic bladder versus bladder outlet obstruction and had a urinary catheter placed.  Patient also had features concerning for chronic cholecystitis.  Given hypotension and low hemoglobin with stool for occult blood positive patient was placed on 2 units of PRBC transfusion.  At the time of my exam patient blood pressure is around 607 systolic at night not tachycardic.  Review of Systems: As per HPI, rest all negative.   Past Medical History:  Diagnosis  Date  . Gastric ulcer    secondary to nsaid 10 years ago  . GI bleed 04/04/2011  . Hip pain, chronic    right  . HLD (hyperlipidemia)   . Hypertension   . Obesity (BMI 35.0-39.9 without comorbidity) 03/2011  . Stroke [facility])    2005    Past Surgical History:  Procedure Laterality Date  . TONSILLECTOMY       reports that he has quit smoking. He has never used smokeless tobacco. He reports current alcohol use. He reports that he does not use drugs.  Allergies  Allergen Reactions  . Pravastatin Swelling and Other (See Comments)    Reported jaw and lip swelling along with myalgias in the back with pravastatin.  Symptoms resolved with stopping.     Family History  Problem Relation Age of Onset  . Stroke Mother   . Kidney disease Brother   . Diabetes Brother     Prior to Admission medications   Medication Sig Start Date End Date Taking? Authorizing Provider  amLODipine (NORVASC) 10 MG tablet Take 1 tablet (10 mg total) by mouth daily. 10/21/19  Yes Wendie Agreste, MD  ibuprofen (ADVIL) 200 MG tablet Take 200 mg by mouth every 6 (six) hours as needed for moderate pain.   Yes [provider]  lisinopril-hydrochlorothiazide (ZESTORETIC) 10-12.5 MG tablet Take 1 tablet by mouth daily. 10/21/19  Yes Wendie Agreste, MD    Physical Exam: Constitutional: Moderately built and nourished. Vitals:  12/15/19 2300 12/15/19 2340 12/15/19 2357 12/16/19 0041  BP: (!) 99/58 129/71 118/72 (!) 148/78  Pulse: (!) 114 (!) 106 (!) 106 (!) 109  Resp: (!) 23 18 12 16   Temp:  98.6 F (37 C) 98.9 F (37.2 C) 99 F (37.2 C)  TempSrc:  Oral Oral Oral  SpO2: 100% 100% 100% 100%  Weight:      Height:       Eyes: Anicteric no pallor. ENMT: No discharge from the ears eyes nose or mouth. Neck: No mass felt.  No neck rigidity. Respiratory: No rhonchi or crepitations. Cardiovascular: S1-S2 heard. Abdomen: Nontender bowel sounds present.  No guarding or rigidity. Musculoskeletal: No  edema. Skin: No rash. Neurologic: Alert awake oriented to time place and person.  Moves all extremities. Psychiatric: Appears normal.  Normal affect.   Labs on Admission: I have personally reviewed following labs and imaging studies  CBC: Recent Labs  Lab 12/15/19 1949  WBC 19.4*  HGB 5.2*  HCT 17.1*  MCV 93.4  PLT 378   Basic Metabolic Panel: Recent Labs  Lab 12/15/19 1949  NA 141  K 3.4*  CL 105  CO2 26  GLUCOSE 156*  BUN 50*  CREATININE 2.02*  CALCIUM 8.6*   GFR: Estimated Creatinine Clearance: 29.6 mL/min (A) (by C-G formula based on SCr of 2.02 mg/dL (H)). Liver Function Tests: Recent Labs  Lab 12/15/19 1949  AST 15  ALT 26  ALKPHOS 37*  BILITOT 0.4  PROT 5.1*  ALBUMIN 2.8*   Recent Labs  Lab 12/15/19 1949  LIPASE 55*   No results for input(s): AMMONIA in the last 168 hours. Coagulation Profile: Recent Labs  Lab 12/15/19 2136  INR 1.1   Cardiac Enzymes: No results for input(s): CKTOTAL, CKMB, CKMBINDEX, TROPONINI in the last 168 hours. BNP (last 3 results) No results for input(s): PROBNP in the last 8760 hours. HbA1C: No results for input(s): HGBA1C in the last 72 hours. CBG: No results for input(s): GLUCAP in the last 168 hours. Lipid Profile: No results for input(s): CHOL, HDL, LDLCALC, TRIG, CHOLHDL, LDLDIRECT in the last 72 hours. Thyroid Function Tests: No results for input(s): TSH, T4TOTAL, FREET4, T3FREE, THYROIDAB in the last 72 hours. Anemia Panel: No results for input(s): VITAMINB12, FOLATE, FERRITIN, TIBC, IRON, RETICCTPCT in the last 72 hours. Urine analysis:    Component Value Date/Time   COLORURINE YELLOW 12/15/2019 2307   APPEARANCEUR CLEAR 12/15/2019 2307   LABSPEC 1.012 12/15/2019 2307   PHURINE 5.0 12/15/2019 2307   GLUCOSEU NEGATIVE 12/15/2019 2307   HGBUR NEGATIVE 12/15/2019 2307   BILIRUBINUR NEGATIVE 12/15/2019 2307   KETONESUR NEGATIVE 12/15/2019 2307   PROTEINUR NEGATIVE 12/15/2019 2307   NITRITE NEGATIVE  12/15/2019 2307   LEUKOCYTESUR NEGATIVE 12/15/2019 2307   Sepsis Labs: @LABRCNTIP (procalcitonin:4,lacticidven:4) ) Recent Results (from the past 240 hour(s))  Respiratory Panel by RT PCR (Flu A&B, Covid) - Nasopharyngeal Swab     Status: None   Collection Time: 12/15/19 10:17 PM   Specimen: Nasopharyngeal Swab  Result Value Ref Range Status   SARS Coronavirus 2 by RT PCR NEGATIVE NEGATIVE Final    Comment: (NOTE) SARS-CoV-2 target nucleic acids are NOT DETECTED.  The SARS-CoV-2 RNA is generally detectable in upper respiratoy specimens during the acute phase of infection. The lowest concentration of SARS-CoV-2 viral copies this assay can detect is 131 copies/mL. A negative result does not preclude SARS-Cov-2 infection and should not be used as the sole basis for treatment or other patient management decisions. A negative  result may occur with  improper specimen collection/handling, submission of specimen other than nasopharyngeal swab, presence of viral mutation(s) within the areas targeted by this assay, and inadequate number of viral copies (<131 copies/mL). A negative result must be combined with clinical observations, patient history, and epidemiological information. The expected result is Negative.  Fact Sheet for Patients:  PinkCheek.be  Fact Sheet for Healthcare Providers:  GravelBags.it  This test is no t yet approved or cleared by the [place] FDA and  has been authorized for detection and/or diagnosis of SARS-CoV-2 by FDA under an Emergency Use Authorization (EUA). This EUA will remain  in effect (meaning this test can be used) for the duration of the COVID-19 declaration under Section 564(b)(1) of the Act, 21 U.S.C. section 360bbb-3(b)(1), unless the authorization is terminated or revoked sooner.     Influenza A by PCR NEGATIVE NEGATIVE Final   Influenza B by PCR NEGATIVE NEGATIVE Final    Comment:  (NOTE) The Xpert Xpress SARS-CoV-2/FLU/RSV assay is intended as an aid in  the diagnosis of influenza from Nasopharyngeal swab specimens and  should not be used as a sole basis for treatment. Nasal washings and  aspirates are unacceptable for Xpert Xpress SARS-CoV-2/FLU/RSV  testing.  Fact Sheet for Patients: PinkCheek.be  Fact Sheet for Healthcare Providers: GravelBags.it  This test is not yet approved or cleared by the [place] FDA and  has been authorized for detection and/or diagnosis of SARS-CoV-2 by  FDA under an Emergency Use Authorization (EUA). This EUA will remain  in effect (meaning this test can be used) for the duration of the  Covid-19 declaration under Section 564(b)(1) of the Act, 21  U.S.C. section 360bbb-3(b)(1), unless the authorization is  terminated or revoked. Performed at [facility], [place] [address], [place], [place] [address]      Radiological Exams on Admission: CT ABDOMEN PELVIS WO CONTRAST  Result Date: 12/15/2019 CLINICAL DATA:  Gastrointestinal hemorrhage, diffuse abdominal pain, emesis EXAM: CT ABDOMEN AND PELVIS WITHOUT CONTRAST TECHNIQUE: Multidetector CT imaging of the abdomen and pelvis was performed following the standard protocol without IV contrast. COMPARISON:  None. FINDINGS: Lower chest: The visualized lung bases are clear save for a few insignificant pneumatoceles. Visualized heart and pericardium are unremarkable. Hepatobiliary: Multiple simple cysts noted within the a hepatic dome. Liver otherwise unremarkable the gallbladder is contracted without pericholecystic inflammatory change. Punctate calcification within the gallbladder fossa may relate to remote or chronic inflammation. The extrahepatic bile duct is within normal limits for age. No intrahepatic biliary ductal dilation. Pancreas: Unremarkable Spleen: Fall unremarkable Adrenals/Urinary Tract: Bilateral adrenal adenoma  are identified measuring 20 mm on the left and 22 mm on the right. The kidneys are normal in size and position. No intrarenal or ureteral calculi. Exophytic simple cortical cysts arise from the left kidney. There is moderate bilateral hydronephrosis and marked hydroureter. There is massive distention of the bladder with an estimated bladder volume of approximately 1800 cc. Stomach/Bowel: Severe sigmoid diverticulosis. Scattered diverticular seen throughout the remainder of the colon. The stomach, small bowel, and large bowel are otherwise unremarkable. Appendix normal. No free intraperitoneal gas or fluid. Vascular/Lymphatic: Moderate aortoiliac atherosclerotic calcification. No aneurysm. No pathologic adenopathy within the abdomen and pelvis Reproductive: Moderate prostatic enlargement. Seminal vesicles are unremarkable. Other: Small fat containing bilateral inguinal hernias are present. Rectum unremarkable. Musculoskeletal: No acute bone abnormality. Bilateral L5 pars defects are present with grade 1 anterolisthesis of L5 upon S1. Degenerative changes are seen throughout the lumbar spine. IMPRESSION:  Marked distension of the bladder and moderate bilateral hydronephrosis in keeping with changes of neurogenic bladder or bladder outlet obstruction, possibly related to prostatic hypertrophy. The estimated bladder volume is approximately 1800 cc. Contracted gallbladder demonstrating punctate foci of calcification within the gallbladder fossa possibly reflecting changes of chronic cholecystitis. This could be further assessed with dedicated sonography or hepatic biliary scintigraphy once the patient's acute issues have resolved. Bilateral adrenal adenoma. Aortic Atherosclerosis (ICD10-I70.0). Electronically Signed   By: Fidela Salisbury MD   On: 12/15/2019 22:27   DG Chest 2 View  Result Date: 12/15/2019 CLINICAL DATA:  GI bleeding, concern for malignancy, evaluate pulmonary nodules EXAM: CHEST - 2 VIEW COMPARISON:   Insert agree CT abdomen pelvis 12/15/2019, radiograph 04/04/2011 FINDINGS: Low lung volumes. Vascular crowding with ectatic changes. No discernible pulmonary nodules or masses. Mild vascular congestion without frank edema. No consolidative opacity. Prominent cardiac silhouette though possibly accentuated by technique. The aorta is calcified. The remaining cardiomediastinal contours are unremarkable. No acute osseous or soft tissue abnormality. Degenerative changes are present in the imaged spine and shoulders. Telemetry leads overlie the chest. IMPRESSION: Low lung volumes, atelectasis and vascular congestion without frank edema. No discerning pulmonary nodules or masses though evaluation limited on portable radiographic technique. These results and limitations were discussed by telephone at the time of interpretation on 12/15/2019 at 10:32 pm to provider [facility] , who verbally acknowledged these results. Electronically Signed   By: Lovena Le M.D.   On: 12/15/2019 22:32     Assessment/Plan Principal Problem:   Acute GI bleeding Active Problems:   Acute posthemorrhagic anemia   ARF (acute renal failure) ([place])    1. Acute GI bleeding -unclear if it is upper or lower.  Since patient states he does have hematemesis also.  We will keep patient on Protonix infusion n.p.o. in anticipation of EGD/colonoscopy consult GI.  Patient receiving 2 units PRBC transfusion.  Patient has had a colonoscopy and EGD in 2013 which showed diverticulosis of the colon and also some diverticulosis in the bulbar area during EGD. 2. Acute renal failure with bladder outlet obstruction -I think patient's bladder outlet obstruction is likely contributing to acute renal failure.  We will also hold off patient's lisinopril which could be contributing.  In addition hypotension and anemia could be contributing.  Patient is having good urine output out of bladder catheter placed.  Will need urology follow-up.  Gentle  hydration. 3. Acute blood loss anemia receiving 2 units PRBC transfusion. 4. Possible chronic cholecystitis seen in the CAT scan.  Likely will need HIDA scan and further imaging and input from GI.  We will keep patient on empiric antibiotics for now given the patient has leukocytosis. 5. Hypertension we will keep patient on as needed IV hydralazine for now. 6. History of prediabetes we will follow CBGs closely.  Since patient is acute GI bleed with acute renal failure initially hypotensive with severe anemia possible chronic cholecystitis with leukocytosis will need close monitoring for any further worsening in inpatient status.  EKG is pending.   DVT prophylaxis: SCDs.  Patient is an active GI bleed.  Avoid anticoagulation. Code Status: Full code. Family Communication: Discussed with patient. Disposition Plan: Home. Consults called: GI message sent through secure chat for [facility].  Was seen by Dr. Henrene Pastor previously. Admission status: Inpatient.   Rise Patience MD [facility] Pager [phone].  If 7PM-7AM, please contact night-coverage [email or web address] Password [email or web address]  12/16/2019, 1:24 AM

## 2019-12-16 NOTE — ED Notes (Signed)
Pt ate complete full liquid diet. Foley cath with 997ml frank dk red in color noted. MD aware, will continue to monitor.

## 2019-12-16 NOTE — ED Notes (Signed)
Report called to [place], Therapist, sports. Denies any questions. Pt placed in cardiac monitor for transport.

## 2019-12-16 NOTE — ED Notes (Signed)
Lunch Tray Orders @ 1007. °

## 2019-12-16 NOTE — Consult Note (Addendum)
Referring Provider:Dr. Terrilee Croak Primary Care Physician:  Wendie Agreste, MD Primary Gastroenterologist:  Althia Forts  Reason for Consultation:  Upper GI bleeding  HPI: Steven Vargas is a 84 y.o. male with history of PUD (NSAID-induced, >10 years ago), presumed diverticular bleeding, and stroke (2005) presenting for consultation of suspected upper GI bleeding.  Patient reports melenic stools over the past several weeks.  He has also had epigastric pain, particularly after meals.  He also notes dysphagia to solids over the past few weeks, stating he is felt like he has food stuck, and then induces vomiting as a result.  He states he has noticed a small amount of red blood after induction of emesis.  Denies changes in appetite or unexplained weight loss.  Patient also reports constipation, stating he has to manually disimpact stools at times.  He has noted small amounts of bright red blood per rectum after disimpaction of stools.  Denies any blood thinner or aspirin use.  Occasionally takes ibuprofen, though he states this is infrequent.  He took 1 ibuprofen this week.  Patient was found to be anemic on arrival to ED.  He denies any fatigue, chest pain, shortness of breath, syncope.  Denies any family history of colon cancer or gastrointestinal malignancy.  Patient had EGD/colonoscopy in 2013 for hematochezia and history of gastric ulcer.  EGD 03/2011: Deformity in the bulb/descending duodenum and duodenal diverticulum  Colonoscopy 03/2011: one 79mm and one 66mm polyps in ascending colon (bx: tubular adenoma), 57mm polyp in transverse colon  Past Medical History:  Diagnosis Date  . Gastric ulcer    secondary to nsaid 10 years ago  . GI bleed 04/04/2011  . Hip pain, chronic    right  . HLD (hyperlipidemia)   . Hypertension   . Obesity (BMI 35.0-39.9 without comorbidity) 03/2011  . Stroke [facility])    2005    Past Surgical History:  Procedure Laterality Date  . TONSILLECTOMY      Prior  to Admission medications   Medication Sig Start Date End Date Taking? Authorizing Provider  amLODipine (NORVASC) 10 MG tablet Take 1 tablet (10 mg total) by mouth daily. 10/21/19  Yes Wendie Agreste, MD  ibuprofen (ADVIL) 200 MG tablet Take 200 mg by mouth every 6 (six) hours as needed for moderate pain.   Yes [provider]  lisinopril-hydrochlorothiazide (ZESTORETIC) 10-12.5 MG tablet Take 1 tablet by mouth daily. 10/21/19  Yes Wendie Agreste, MD    Scheduled Meds: . [START ON 12/19/2019] pantoprazole  40 mg Intravenous Q12H  . tamsulosin  0.4 mg Oral Daily   Continuous Infusions: . sodium chloride 75 mL/hr at 12/16/19 0147  . pantoprozole (PROTONIX) infusion 8 mg/hr (12/16/19 0208)  . piperacillin-tazobactam (ZOSYN)  IV     PRN Meds:.acetaminophen **OR** acetaminophen, hydrALAZINE  Allergies as of 12/15/2019 - Review Complete 12/15/2019  Allergen Reaction Noted  . Pravastatin Swelling and Other (See Comments) 10/20/2018    Family History  Problem Relation Age of Onset  . Stroke Mother   . Kidney disease Brother   . Diabetes Brother     Social History   Socioeconomic History  . Marital status: Married    Spouse name: Not on file  . Number of children: Not on file  . Years of education: Not on file  . Highest education level: Not on file  Occupational History  . Not on file  Tobacco Use  . Smoking status: Former Research scientist (life sciences)  . Smokeless tobacco: Never Used  Substance and  Sexual Activity  . Alcohol use: Yes    Comment: occasional  . Drug use: No  . Sexual activity: Not Currently  Other Topics Concern  . Not on file  Social History Narrative   Lives in [place], [place] with his wife and son. 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 Drinks 2-3 beers once every few weeks. Smoking 2 years ago. Used to smoke 2-3 packs per day before that for past many years. Never did illicit drugs. Has [employer] health care Medicare.   Social Determinants of  Health   Financial Resource Strain:   . Difficulty of Paying Living Expenses: Not on file  Food Insecurity:   . Worried About Charity fundraiser in the Last Year: Not on file  . Ran Out of Food in the Last Year: Not on file  Transportation Needs:   . Lack of Transportation (Medical): Not on file  . Lack of Transportation (Non-Medical): Not on file  Physical Activity:   . Days of Exercise per Week: Not on file  . Minutes of Exercise per Session: Not on file  Stress:   . Feeling of Stress : Not on file  Social Connections:   . Frequency of Communication with Friends and Family: Not on file  . Frequency of Social Gatherings with Friends and Family: Not on file  . Attends Religious Services: Not on file  . Active Member of Clubs or Organizations: Not on file  . Attends Archivist Meetings: Not on file  . Marital Status: Not on file  Intimate Partner Violence:   . Fear of Current or Ex-Partner: Not on file  . Emotionally Abused: Not on file  . Physically Abused: Not on file  . Sexually Abused: Not on file    Review of Systems: Review of Systems  Constitutional: Negative for chills, fever, malaise/fatigue and weight loss.  HENT: Negative for sore throat.   Eyes: Negative for pain and redness.  Respiratory: Negative for cough, shortness of breath and stridor.   Cardiovascular: Negative for chest pain and palpitations.  Gastrointestinal: Positive for abdominal pain, blood in stool, constipation, melena, nausea and vomiting. Negative for diarrhea and heartburn.  Genitourinary: Negative for dysuria and flank pain.  Musculoskeletal: Negative for falls and neck pain.  Neurological: Negative for seizures and loss of consciousness.  Endo/Heme/Allergies: Negative for polydipsia. Does not bruise/bleed easily.  Psychiatric/Behavioral: Negative for substance abuse. The patient is not nervous/anxious.      Physical Exam: Vital signs: Vitals:   12/16/19 0713 12/16/19 0815  BP:  (!) 158/88 (!) 157/87  Pulse: 95 88  Resp: 20 19  Temp: 98.8 F (37.1 C)   SpO2: 100% 100%      Physical Exam Vitals reviewed.  Constitutional:      General: He is not in acute distress.    Appearance: He is overweight.  HENT:     Head: Normocephalic and atraumatic.     Nose: Nose normal. No congestion.     Mouth/Throat:     Mouth: Mucous membranes are moist.     Pharynx: Oropharynx is clear.  Eyes:     General: No scleral icterus.    Extraocular Movements: Extraocular movements intact.     Comments: Conjunctival pallor  Cardiovascular:     Rate and Rhythm: Normal rate and regular rhythm.     Pulses: Normal pulses.  Pulmonary:     Effort: Pulmonary effort is normal. No respiratory distress.     Breath sounds:  Normal breath sounds.  Abdominal:     General: Bowel sounds are normal. There is no distension.     Palpations: Abdomen is soft. There is no mass.     Tenderness: There is no abdominal tenderness. There is no guarding or rebound.     Hernia: No hernia is present.  Genitourinary:    Comments: Foley catheter in place with bloody output Musculoskeletal:        General: No swelling or tenderness.     Cervical back: Normal range of motion and neck supple.  Skin:    General: Skin is warm and dry.  Neurological:     General: No focal deficit present.     Mental Status: He is oriented to person, place, and time. He is lethargic.  Psychiatric:        Mood and Affect: Mood normal.        Behavior: Behavior normal. Behavior is cooperative.      GI:  Lab Results: Recent Labs    12/15/19 1949 12/16/19 0739  WBC 19.4* 18.4*  HGB 5.2* 8.0*  HCT 17.1* 25.0*  PLT 313 190   BMET Recent Labs    12/15/19 1949 12/16/19 0739  NA 141 144  K 3.4* 3.8  CL 105 110  CO2 26 25  GLUCOSE 156* 131*  BUN 50* 46*  CREATININE 2.02* 1.97*  CALCIUM 8.6* 7.8*   LFT Recent Labs    12/15/19 1949  PROT 5.1*  ALBUMIN 2.8*  AST 15  ALT 26  ALKPHOS 37*  BILITOT 0.4    PT/INR Recent Labs    12/15/19 2136  LABPROT 13.9  INR 1.1     Studies/Results: CT ABDOMEN PELVIS WO CONTRAST  Result Date: 12/15/2019 CLINICAL DATA:  Gastrointestinal hemorrhage, diffuse abdominal pain, emesis EXAM: CT ABDOMEN AND PELVIS WITHOUT CONTRAST TECHNIQUE: Multidetector CT imaging of the abdomen and pelvis was performed following the standard protocol without IV contrast. COMPARISON:  None. FINDINGS: Lower chest: The visualized lung bases are clear save for a few insignificant pneumatoceles. Visualized heart and pericardium are unremarkable. Hepatobiliary: Multiple simple cysts noted within the a hepatic dome. Liver otherwise unremarkable the gallbladder is contracted without pericholecystic inflammatory change. Punctate calcification within the gallbladder fossa may relate to remote or chronic inflammation. The extrahepatic bile duct is within normal limits for age. No intrahepatic biliary ductal dilation. Pancreas: Unremarkable Spleen: Fall unremarkable Adrenals/Urinary Tract: Bilateral adrenal adenoma are identified measuring 20 mm on the left and 22 mm on the right. The kidneys are normal in size and position. No intrarenal or ureteral calculi. Exophytic simple cortical cysts arise from the left kidney. There is moderate bilateral hydronephrosis and marked hydroureter. There is massive distention of the bladder with an estimated bladder volume of approximately 1800 cc. Stomach/Bowel: Severe sigmoid diverticulosis. Scattered diverticular seen throughout the remainder of the colon. The stomach, small bowel, and large bowel are otherwise unremarkable. Appendix normal. No free intraperitoneal gas or fluid. Vascular/Lymphatic: Moderate aortoiliac atherosclerotic calcification. No aneurysm. No pathologic adenopathy within the abdomen and pelvis Reproductive: Moderate prostatic enlargement. Seminal vesicles are unremarkable. Other: Small fat containing bilateral inguinal hernias are present.  Rectum unremarkable. Musculoskeletal: No acute bone abnormality. Bilateral L5 pars defects are present with grade 1 anterolisthesis of L5 upon S1. Degenerative changes are seen throughout the lumbar spine. IMPRESSION: Marked distension of the bladder and moderate bilateral hydronephrosis in keeping with changes of neurogenic bladder or bladder outlet obstruction, possibly related to prostatic hypertrophy. The estimated bladder volume is approximately 1800  cc. Contracted gallbladder demonstrating punctate foci of calcification within the gallbladder fossa possibly reflecting changes of chronic cholecystitis. This could be further assessed with dedicated sonography or hepatic biliary scintigraphy once the patient's acute issues have resolved. Bilateral adrenal adenoma. Aortic Atherosclerosis (ICD10-I70.0). Electronically Signed   By: Fidela Salisbury MD   On: 12/15/2019 22:27   DG Chest 2 View  Result Date: 12/15/2019 CLINICAL DATA:  GI bleeding, concern for malignancy, evaluate pulmonary nodules EXAM: CHEST - 2 VIEW COMPARISON:  Insert agree CT abdomen pelvis 12/15/2019, radiograph 04/04/2011 FINDINGS: Low lung volumes. Vascular crowding with ectatic changes. No discernible pulmonary nodules or masses. Mild vascular congestion without frank edema. No consolidative opacity. Prominent cardiac silhouette though possibly accentuated by technique. The aorta is calcified. The remaining cardiomediastinal contours are unremarkable. No acute osseous or soft tissue abnormality. Degenerative changes are present in the imaged spine and shoulders. Telemetry leads overlie the chest. IMPRESSION: Low lung volumes, atelectasis and vascular congestion without frank edema. No discerning pulmonary nodules or masses though evaluation limited on portable radiographic technique. These results and limitations were discussed by telephone at the time of interpretation on 12/15/2019 at 10:32 pm to provider [facility] , who verbally  acknowledged these results. Electronically Signed   By: Lovena Le M.D.   On: 12/15/2019 22:32    Impression: Suspected upper GI bleeding: Melena x2 to 3 weeks, small-volume hematemesis after induction of vomiting, epigastric abdominal pain, history of peptic ulcer.  Differential diagnosis includes PUD versus gastritis versus esophagitis versus mass/lesion. -Hemoglobin 5.2 on arrival yesterday.  Hemoglobin currently 8.0 after 2u pRBCs.  Last hemoglobin on file was 15.6 in 12/2015. -INR 1.1 -Hemodynamically stable.  Normotensive to hypertensive, normal heart rate  AKI: BUN 46/Cr 1.97  ?chronic cholecystitis: CT 12/15/19: contracted gallbladder demonstrating punctate foci of calcification within the gallbladder fossa possibly reflecting changes of chronic cholecystitis.  -Normal LFTs  Plan: EGD tomorrow.  I thoroughly discussed the procedure with the patient to include nature, alternatives, benefits, and risks (including but not limited to bleeding, infection, perforation, anesthesia/cardiac and pulmonary complications).  Patient verbalized understanding and gave verbal consent to proceed with EGD.  Also discussed possibility of colonoscopy, due to small amounts of bright red blood per rectum and history of adenomatous polyps.  Patient prefers to defer colonoscopy until after EGD, which I think is reasonable.  If no source of bleeding is identified on EGD, we will plan for inpatient colonoscopy.  Continue Protonix drip.  Clear liquid diet with NPO after midnight.  Continue to monitor H&H with transfusion as needed to maintain hemoglobin greater than 7.  If epigastric abdominal pain and nausea/vomiting persist despite treatment of presumed gastric source, recommend HIDA scan for possible chronic cholecystitis.  [facility] will follow.   LOS: 0 days   Salley Slaughter  PA-C 12/16/2019, 8:49 AM  Contact #  [phone]

## 2019-12-17 ENCOUNTER — Inpatient Hospital Stay (HOSPITAL_COMMUNITY): Payer: Medicare Other | Admitting: Anesthesiology

## 2019-12-17 ENCOUNTER — Encounter (HOSPITAL_COMMUNITY): Payer: Self-pay | Admitting: Internal Medicine

## 2019-12-17 ENCOUNTER — Encounter (HOSPITAL_COMMUNITY)
Admission: EM | Disposition: A | Discharge status: Home / Self Care | Payer: Self-pay | Source: Home / Self Care | Attending: Internal Medicine

## 2019-12-17 DIAGNOSIS — K922 Gastrointestinal hemorrhage, unspecified: Secondary | ICD-10-CM | POA: Diagnosis not present

## 2019-12-17 HISTORY — PX: ESOPHAGOGASTRODUODENOSCOPY: SHX5428

## 2019-12-17 HISTORY — PX: BIOPSY: SHX5522

## 2019-12-17 LAB — CBC WITH DIFFERENTIAL/PLATELET
Abs Immature Granulocytes: 0.18 10*3/uL — ABNORMAL HIGH (ref 0.00–0.07)
Basophils Absolute: 0 10*3/uL (ref 0.0–0.1)
Basophils Relative: 0 %
Eosinophils Absolute: 0.1 10*3/uL (ref 0.0–0.5)
Eosinophils Relative: 1 %
HCT: 19.2 % — ABNORMAL LOW (ref 39.0–52.0)
Hemoglobin: 6.2 g/dL — CL (ref 13.0–17.0)
Immature Granulocytes: 1 %
Lymphocytes Relative: 14 %
Lymphs Abs: 2 10*3/uL (ref 0.7–4.0)
MCH: 30.4 pg (ref 26.0–34.0)
MCHC: 32.3 g/dL (ref 30.0–36.0)
MCV: 94.1 fL (ref 80.0–100.0)
Monocytes Absolute: 1.3 10*3/uL — ABNORMAL HIGH (ref 0.1–1.0)
Monocytes Relative: 9 %
Neutro Abs: 10.7 10*3/uL — ABNORMAL HIGH (ref 1.7–7.7)
Neutrophils Relative %: 75 %
Platelets: 200 10*3/uL (ref 150–400)
RBC: 2.04 MIL/uL — ABNORMAL LOW (ref 4.22–5.81)
RDW: 18.1 % — ABNORMAL HIGH (ref 11.5–15.5)
WBC: 14.2 10*3/uL — ABNORMAL HIGH (ref 4.0–10.5)
nRBC: 0.4 % — ABNORMAL HIGH (ref 0.0–0.2)

## 2019-12-17 LAB — BASIC METABOLIC PANEL
Anion gap: 7 (ref 5–15)
BUN: 35 mg/dL — ABNORMAL HIGH (ref 8–23)
CO2: 26 mmol/L (ref 22–32)
Calcium: 7.2 mg/dL — ABNORMAL LOW (ref 8.9–10.3)
Chloride: 107 mmol/L (ref 98–111)
Creatinine, Ser: 1.79 mg/dL — ABNORMAL HIGH (ref 0.61–1.24)
GFR, Estimated: 37 mL/min — ABNORMAL LOW (ref >60)
Glucose, Bld: 107 mg/dL — ABNORMAL HIGH (ref 70–99)
Potassium: 3.2 mmol/L — ABNORMAL LOW (ref 3.5–5.1)
Sodium: 140 mmol/L (ref 135–145)

## 2019-12-17 LAB — PREPARE RBC (CROSSMATCH)

## 2019-12-17 LAB — GLUCOSE, CAPILLARY
Glucose-Capillary: 104 mg/dL — ABNORMAL HIGH (ref 70–99)
Glucose-Capillary: 106 mg/dL — ABNORMAL HIGH (ref 70–99)
Glucose-Capillary: 116 mg/dL — ABNORMAL HIGH (ref 70–99)
Glucose-Capillary: 139 mg/dL — ABNORMAL HIGH (ref 70–99)
Glucose-Capillary: 96 mg/dL (ref 70–99)

## 2019-12-17 LAB — MAGNESIUM: Magnesium: 1.9 mg/dL (ref 1.7–2.4)

## 2019-12-17 LAB — HEMOGLOBIN AND HEMATOCRIT, BLOOD
HCT: 23.2 % — ABNORMAL LOW (ref 39.0–52.0)
Hemoglobin: 7.7 g/dL — ABNORMAL LOW (ref 13.0–17.0)

## 2019-12-17 LAB — PHOSPHORUS: Phosphorus: 3.8 mg/dL (ref 2.5–4.6)

## 2019-12-17 SURGERY — EGD (ESOPHAGOGASTRODUODENOSCOPY)
Anesthesia: Monitor Anesthesia Care

## 2019-12-17 MED ORDER — LIDOCAINE HCL (CARDIAC) PF 100 MG/5ML IV SOSY
PREFILLED_SYRINGE | INTRAVENOUS | Status: DC | PRN
  Administered 2019-12-17: 100 mg via INTRAVENOUS

## 2019-12-17 MED ORDER — LACTATED RINGERS IV SOLN: INTRAVENOUS | Status: DC | PRN

## 2019-12-17 MED ORDER — PROPOFOL 500 MG/50ML IV EMUL
INTRAVENOUS | Status: DC | PRN
  Administered 2019-12-17: 100 ug/kg/min via INTRAVENOUS

## 2019-12-17 MED ORDER — FINASTERIDE 5 MG PO TABS
5.0000 mg | ORAL_TABLET | Freq: Every day | ORAL | Status: DC
  Administered 2019-12-18 – 2019-12-20 (×3): 5 mg via ORAL
  Filled 2019-12-17 (×3): qty 1

## 2019-12-17 MED ORDER — GLYCOPYRROLATE 0.2 MG/ML IJ SOLN
INTRAMUSCULAR | Status: DC | PRN
  Administered 2019-12-17: .2 mg via INTRAVENOUS

## 2019-12-17 MED ORDER — MORPHINE SULFATE (PF) 2 MG/ML IV SOLN
1.0000 mg | INTRAVENOUS | Status: DC | PRN
  Administered 2019-12-17 – 2019-12-18 (×2): 1 mg via INTRAVENOUS
  Filled 2019-12-17 (×3): qty 1

## 2019-12-17 MED ORDER — SODIUM CHLORIDE 0.9 % IV SOLN: INTRAVENOUS | Status: DC

## 2019-12-17 MED ORDER — PHENYLEPHRINE HCL (PRESSORS) 10 MG/ML IV SOLN
INTRAVENOUS | Status: DC | PRN
  Administered 2019-12-17: 80 ug via INTRAVENOUS

## 2019-12-17 MED ORDER — PROPOFOL 10 MG/ML IV BOLUS
INTRAVENOUS | Status: DC | PRN
  Administered 2019-12-17 (×2): 20 mg via INTRAVENOUS

## 2019-12-17 MED ORDER — POTASSIUM CHLORIDE 10 MEQ/100ML IV SOLN
10.0000 meq | INTRAVENOUS | Status: AC
  Administered 2019-12-17: 10 meq via INTRAVENOUS
  Filled 2019-12-17: qty 100

## 2019-12-17 NOTE — Op Note (Signed)
[facility] Patient Name: Steven Vargas Procedure Date : 12/17/2019 MRN: [id number] Attending MD: [name] , MD Date of Birth: [birth year] CSN: [id number] Age: 84 Admit Type: Inpatient Procedure:                Upper GI endoscopy Indications:              Suspected upper gastrointestinal bleeding, Acute                            post hemorrhagic anemia, Melena Providers:                [name], MD, Doristine Johns, RN,                            Elspeth Cho Tech., Technician, Raphael Gibney,                            CRNA Referring MD:             hospital team Medicines:                Propofol per Anesthesia, Monitored Anesthesia Care Complications:            No immediate complications. Estimated Blood Loss:     Estimated blood loss was minimal. Procedure:                Pre-Anesthesia Assessment:                           - Prior to the procedure, a History and Physical                            was performed, and patient medications and                            allergies were reviewed. The patient's tolerance of                            previous anesthesia was also reviewed. The risks                            and benefits of the procedure and the sedation                            options and risks were discussed with the patient.                            All questions were answered, and informed consent                            was obtained. Prior Anticoagulants: The patient has                            taken no previous anticoagulant or antiplatelet  agents. ASA Grade Assessment: III - A patient with                            severe systemic disease. After reviewing the risks                            and benefits, the patient was deemed in                            satisfactory condition to undergo the procedure.                           After obtaining informed consent, the endoscope was                             passed under direct vision. Throughout the                            procedure, the patient's blood pressure, pulse, and                            oxygen saturations were monitored continuously. The                            GIF-H190 ([id number]) Olympus gastroscope was                            introduced through the mouth, and advanced to the                            second part of duodenum. The upper GI endoscopy was                            accomplished without difficulty. The patient                            tolerated the procedure well. Scope In: Scope Out: Findings:      The examined esophagus was normal.      The Z-line was regular and was found 35 cm from the incisors.      Four non-bleeding cratered gastric ulcers with pigmented material were       found in the gastric antrum and in the prepyloric region of the stomach.       The largest lesion was 8 mm in largest dimension.      Segmental moderate inflammation characterized by congestion (edema) and       erythema was found in the gastric antrum and in the prepyloric region of       the stomach. Biopsies were taken with a cold forceps for histology.       Estimated blood loss was minimal.      One non-bleeding cratered gastric ulcer with no stigmata of bleeding was       found at the incisura. The lesion was 5 mm in largest dimension.      Segmental moderate inflammation characterized by congestion (edema) and  erythema was found in the gastric body and at the incisura. Biopsies       were taken with a cold forceps for histology. Estimated blood loss was       minimal.      The cardia and gastric fundus were normal on retroflexion.      The examined duodenum was normal. Impression:               - Normal esophagus.                           - Z-line regular, 35 cm from the incisors.                           - Non-bleeding gastric ulcers with pigmented                            material.                            - Acute gastritis. Biopsied.                           - Non-bleeding gastric ulcer with no stigmata of                            bleeding.                           - Acute gastritis. Biopsied.                           - Normal examined duodenum. Recommendation:           - Patient has a contact number available for                            emergencies. The signs and symptoms of potential                            delayed complications were discussed with the                            patient. Return to normal activities tomorrow.                            Written discharge instructions were provided to the                            patient.                           - Clear liquid diet.                           - Await pathology results.                           - Observe patient's clinical course. Procedure Code(s):        ---  Professional ---                           [phone], Esophagogastroduodenoscopy, flexible,                            transoral; with biopsy, single or multiple Diagnosis Code(s):        --- Professional ---                           K92.1, Melena (includes Hematochezia)                           K25.9, Gastric ulcer, unspecified as acute or                            chronic, without hemorrhage or perforation                           K29.00, Acute gastritis without bleeding                           D62, Acute posthemorrhagic anemia CPT copyright 2019 [employer]. All rights reserved. The codes documented in this report are preliminary and upon coder review may  be revised to meet current compliance requirements. [name], MD 12/17/2019 10:16:03 AM This report has been signed electronically. Number of Addenda: 0

## 2019-12-17 NOTE — Consult Note (Signed)
Urology Consult   Physician requesting consult: Marlowe Aschoff Dahal  Reason for consult: Urinary retention and hematuria  History of Present Illness: Steven Vargas is a 84 y.o. male with PMH significant for gastric ulcer, HTN, obesity, and stroke who presented to the ED 12/15/19 with c/o epigastric pain, constipation, and hematochezia.  Labs revealed Hg 5.6, WBC 19.4, Cr 2.02 (baseline 1.12), and fecal occult blood positive.  CT A/P revealed marked distention of urinary bladder with moderate bilateral hydronephrosis. Pt was admitted and given transfusion of PRBCs.   Foley cath was placed with return of 2.5L clear/yellow urine. The following day urine became bloody but still draining well. Pt was started on Flomax by IM. UA on admission showed no infection or hematuria. He had no pain nor urge to void prior to foley being placed.   Pt states that he has had some trouble voiding "for a while".  He has to strain to void and oftentimes leaks at night when he is in the bed.  No SUI or UUI during the day. He is not aware if he empties his bladder with each void.  He has frequency during the day but drinks a lot of fluid.  He had an episode of GH approx 6 months ago with no recurrence. He denies a hx of kidney stones.  He states his PCP Janeann Forehand has never performed a DRE or PSA to his knowledge.  No family hx of GU cancers. He quit smoking approx 20 years ago and has had no exposure to chemicals or asbestos that he is aware of.  He has not had issues with UTIs.   Past Medical History:  Diagnosis Date  . Gastric ulcer    secondary to nsaid 10 years ago  . GI bleed 04/04/2011  . Hip pain, chronic    right  . HLD (hyperlipidemia)   . Hypertension   . Obesity (BMI 35.0-39.9 without comorbidity) 03/2011  . Stroke [facility])    2005    Past Surgical History:  Procedure Laterality Date  . TONSILLECTOMY      Current Hospital Medications:  Home Meds:  . Current Meds  Medication Sig  . amLODipine (NORVASC)  10 MG tablet Take 1 tablet (10 mg total) by mouth daily.  Marland Kitchen ibuprofen (ADVIL) 200 MG tablet Take 200 mg by mouth every 6 (six) hours as needed for moderate pain.  Marland Kitchen lisinopril-hydrochlorothiazide (ZESTORETIC) 10-12.5 MG tablet Take 1 tablet by mouth daily.     Scheduled Meds: . Chlorhexidine Gluconate Cloth  6 each Topical Daily  . [START ON 12/19/2019] pantoprazole  40 mg Intravenous Q12H  . tamsulosin  0.4 mg Oral Daily   Continuous Infusions: . pantoprozole (PROTONIX) infusion 8 mg/hr (12/17/19 1353)  . piperacillin-tazobactam (ZOSYN)  IV 3.375 g (12/17/19 0148)   PRN Meds:.acetaminophen **OR** acetaminophen, hydrALAZINE  Allergies:  Allergies  Allergen Reactions  . Pravastatin Swelling and Other (See Comments)    Reported jaw and lip swelling along with myalgias in the back with pravastatin.  Symptoms resolved with stopping.     Family History  Problem Relation Age of Onset  . Stroke Mother   . Kidney disease Brother   . Diabetes Brother     Social History:  reports that he has quit smoking. He has never used smokeless tobacco. He reports current alcohol use. He reports that he does not use drugs.  ROS: A complete review of systems was performed.  All systems are negative except for pertinent findings as noted.  Physical  Exam:  Vital signs in last 24 hours: Temp:  [97.6 F (36.4 C)-98.7 F (37.1 C)] 97.7 F (36.5 C) (11/18 1106) Pulse Rate:  [74-99] 97 (11/18 1106) Resp:  [8-30] 18 (11/18 1106) BP: (110-158)/(58-92) 129/92 (11/18 1106) SpO2:  [94 %-100 %] 100 % (11/18 1106) Weight:  [87.1 kg] 87.1 kg (11/18 0917) Constitutional:  Alert and oriented, No acute distress Cardiovascular: Regular rate and rhythm, Respiratory: Normal respiratory effort GI: Abdomen is soft, nontender, nondistended, no abdominal masses GU: uncircum penis with 67f foley in place draining dark red urine Lymphatic: No lymphadenopathy Neurologic: Grossly intact, no focal  deficits Psychiatric: Normal mood and affect  Laboratory Data:  Recent Labs    12/15/19 1949 12/16/19 0739 12/17/19 0040 12/17/19 0741  WBC 19.4* 18.4* 14.2*  --   HGB 5.2* 8.0* 6.2* 7.7*  HCT 17.1* 25.0* 19.2* 23.2*  PLT 313 190 200  --     Recent Labs    12/15/19 1949 12/16/19 0739 12/17/19 0040  NA 141 144 140  K 3.4* 3.8 3.2*  CL 105 110 107  GLUCOSE 156* 131* 107*  BUN 50* 46* 35*  CALCIUM 8.6* 7.8* 7.2*  CREATININE 2.02* 1.97* 1.79*     Results for orders placed or performed during the hospital encounter of 12/15/19 (from the past 24 hour(s))  Glucose, capillary     Status: Abnormal   Collection Time: 12/16/19  6:51 PM  Result Value Ref Range   Glucose-Capillary 127 (H) 70 - 99 mg/dL  Glucose, capillary     Status: Abnormal   Collection Time: 12/16/19 11:50 PM  Result Value Ref Range   Glucose-Capillary 100 (H) 70 - 99 mg/dL  Basic metabolic panel     Status: Abnormal   Collection Time: 12/17/19 12:40 AM  Result Value Ref Range   Sodium 140 135 - 145 mmol/L   Potassium 3.2 (L) 3.5 - 5.1 mmol/L   Chloride 107 98 - 111 mmol/L   CO2 26 22 - 32 mmol/L   Glucose, Bld 107 (H) 70 - 99 mg/dL   BUN 35 (H) 8 - 23 mg/dL   Creatinine, Ser 1.79 (H) 0.61 - 1.24 mg/dL   Calcium 7.2 (L) 8.9 - 10.3 mg/dL   GFR, Estimated 37 (L) >60 mL/min   Anion gap 7 5 - 15  CBC with Differential/Platelet     Status: Abnormal   Collection Time: 12/17/19 12:40 AM  Result Value Ref Range   WBC 14.2 (H) 4.0 - 10.5 K/uL   RBC 2.04 (L) 4.22 - 5.81 MIL/uL   Hemoglobin 6.2 (LL) 13.0 - 17.0 g/dL   HCT 19.2 (L) 39 - 52 %   MCV 94.1 80.0 - 100.0 fL   MCH 30.4 26.0 - 34.0 pg   MCHC 32.3 30.0 - 36.0 g/dL   RDW 18.1 (H) 11.5 - 15.5 %   Platelets 200 150 - 400 K/uL   nRBC 0.4 (H) 0.0 - 0.2 %   Neutrophils Relative % 75 %   Neutro Abs 10.7 (H) 1.7 - 7.7 K/uL   Lymphocytes Relative 14 %   Lymphs Abs 2.0 0.7 - 4.0 K/uL   Monocytes Relative 9 %   Monocytes Absolute 1.3 (H) 0.1 - 1.0 K/uL    Eosinophils Relative 1 %   Eosinophils Absolute 0.1 0.0 - 0.5 K/uL   Basophils Relative 0 %   Basophils Absolute 0.0 0.0 - 0.1 K/uL   Immature Granulocytes 1 %   Abs Immature Granulocytes 0.18 (H) 0.00 - 0.07 K/uL  Magnesium     Status: None   Collection Time: 12/17/19 12:40 AM  Result Value Ref Range   Magnesium 1.9 1.7 - 2.4 mg/dL  Phosphorus     Status: None   Collection Time: 12/17/19 12:40 AM  Result Value Ref Range   Phosphorus 3.8 2.5 - 4.6 mg/dL  Prepare RBC (crossmatch)     Status: None   Collection Time: 12/17/19  1:47 AM  Result Value Ref Range   Order Confirmation      ORDER PROCESSED BY BLOOD BANK Performed at [facility], [place] [address], [place], [place] [address]   Glucose, capillary     Status: None   Collection Time: 12/17/19  6:23 AM  Result Value Ref Range   Glucose-Capillary 96 70 - 99 mg/dL  Hemoglobin and hematocrit, blood     Status: Abnormal   Collection Time: 12/17/19  7:41 AM  Result Value Ref Range   Hemoglobin 7.7 (L) 13.0 - 17.0 g/dL   HCT 23.2 (L) 39 - 52 %  Glucose, capillary     Status: Abnormal   Collection Time: 12/17/19  8:08 AM  Result Value Ref Range   Glucose-Capillary 106 (H) 70 - 99 mg/dL   Comment 1 QC Due   Glucose, capillary     Status: Abnormal   Collection Time: 12/17/19 12:12 PM  Result Value Ref Range   Glucose-Capillary 139 (H) 70 - 99 mg/dL  BLOOD TRANSFUSION REPORT - SCANNED     Status: None   Collection Time: 12/17/19 12:37 PM   Narrative   Ordered by an unspecified provider.   Recent Results (from the past 240 hour(s))  Respiratory Panel by RT PCR (Flu A&B, Covid) - Nasopharyngeal Swab     Status: None   Collection Time: 12/15/19 10:17 PM   Specimen: Nasopharyngeal Swab  Result Value Ref Range Status   SARS Coronavirus 2 by RT PCR NEGATIVE NEGATIVE Final    Comment: (NOTE) SARS-CoV-2 target nucleic acids are NOT DETECTED.  The SARS-CoV-2 RNA is generally detectable in upper respiratoy specimens during  the acute phase of infection. The lowest concentration of SARS-CoV-2 viral copies this assay can detect is 131 copies/mL. A negative result does not preclude SARS-Cov-2 infection and should not be used as the sole basis for treatment or other patient management decisions. A negative result may occur with  improper specimen collection/handling, submission of specimen other than nasopharyngeal swab, presence of viral mutation(s) within the areas targeted by this assay, and inadequate number of viral copies (<131 copies/mL). A negative result must be combined with clinical observations, patient history, and epidemiological information. The expected result is Negative.  Fact Sheet for Patients:  PinkCheek.be  Fact Sheet for Healthcare Providers:  GravelBags.it  This test is no t yet approved or cleared by the [place] FDA and  has been authorized for detection and/or diagnosis of SARS-CoV-2 by FDA under an Emergency Use Authorization (EUA). This EUA will remain  in effect (meaning this test can be used) for the duration of the COVID-19 declaration under Section 564(b)(1) of the Act, 21 U.S.C. section 360bbb-3(b)(1), unless the authorization is terminated or revoked sooner.     Influenza A by PCR NEGATIVE NEGATIVE Final   Influenza B by PCR NEGATIVE NEGATIVE Final    Comment: (NOTE) The Xpert Xpress SARS-CoV-2/FLU/RSV assay is intended as an aid in  the diagnosis of influenza from Nasopharyngeal swab specimens and  should not be used as a sole basis for treatment. Nasal washings  and  aspirates are unacceptable for Xpert Xpress SARS-CoV-2/FLU/RSV  testing.  Fact Sheet for Patients: PinkCheek.be  Fact Sheet for Healthcare Providers: GravelBags.it  This test is not yet approved or cleared by the [place] FDA and  has been authorized for detection and/or  diagnosis of SARS-CoV-2 by  FDA under an Emergency Use Authorization (EUA). This EUA will remain  in effect (meaning this test can be used) for the duration of the  Covid-19 declaration under Section 564(b)(1) of the Act, 21  U.S.C. section 360bbb-3(b)(1), unless the authorization is  terminated or revoked. Performed at [facility], [place] [address], [place], [place] [address]     Renal Function: Recent Labs    12/15/19 1949 12/16/19 0739 12/17/19 0040  CREATININE 2.02* 1.97* 1.79*   Estimated Creatinine Clearance: 31.7 mL/min (A) (by C-G formula based on SCr of 1.79 mg/dL (H)).  Radiologic Imaging: CT ABDOMEN PELVIS WO CONTRAST  Result Date: 12/15/2019 CLINICAL DATA:  Gastrointestinal hemorrhage, diffuse abdominal pain, emesis EXAM: CT ABDOMEN AND PELVIS WITHOUT CONTRAST TECHNIQUE: Multidetector CT imaging of the abdomen and pelvis was performed following the standard protocol without IV contrast. COMPARISON:  None. FINDINGS: Lower chest: The visualized lung bases are clear save for a few insignificant pneumatoceles. Visualized heart and pericardium are unremarkable. Hepatobiliary: Multiple simple cysts noted within the a hepatic dome. Liver otherwise unremarkable the gallbladder is contracted without pericholecystic inflammatory change. Punctate calcification within the gallbladder fossa may relate to remote or chronic inflammation. The extrahepatic bile duct is within normal limits for age. No intrahepatic biliary ductal dilation. Pancreas: Unremarkable Spleen: Fall unremarkable Adrenals/Urinary Tract: Bilateral adrenal adenoma are identified measuring 20 mm on the left and 22 mm on the right. The kidneys are normal in size and position. No intrarenal or ureteral calculi. Exophytic simple cortical cysts arise from the left kidney. There is moderate bilateral hydronephrosis and marked hydroureter. There is massive distention of the bladder with an estimated bladder volume of  approximately 1800 cc. Stomach/Bowel: Severe sigmoid diverticulosis. Scattered diverticular seen throughout the remainder of the colon. The stomach, small bowel, and large bowel are otherwise unremarkable. Appendix normal. No free intraperitoneal gas or fluid. Vascular/Lymphatic: Moderate aortoiliac atherosclerotic calcification. No aneurysm. No pathologic adenopathy within the abdomen and pelvis Reproductive: Moderate prostatic enlargement. Seminal vesicles are unremarkable. Other: Small fat containing bilateral inguinal hernias are present. Rectum unremarkable. Musculoskeletal: No acute bone abnormality. Bilateral L5 pars defects are present with grade 1 anterolisthesis of L5 upon S1. Degenerative changes are seen throughout the lumbar spine. IMPRESSION: Marked distension of the bladder and moderate bilateral hydronephrosis in keeping with changes of neurogenic bladder or bladder outlet obstruction, possibly related to prostatic hypertrophy. The estimated bladder volume is approximately 1800 cc. Contracted gallbladder demonstrating punctate foci of calcification within the gallbladder fossa possibly reflecting changes of chronic cholecystitis. This could be further assessed with dedicated sonography or hepatic biliary scintigraphy once the patient's acute issues have resolved. Bilateral adrenal adenoma. Aortic Atherosclerosis (ICD10-I70.0). Electronically Signed   By: Fidela Salisbury MD   On: 12/15/2019 22:27   DG Chest 2 View  Result Date: 12/15/2019 CLINICAL DATA:  GI bleeding, concern for malignancy, evaluate pulmonary nodules EXAM: CHEST - 2 VIEW COMPARISON:  Insert agree CT abdomen pelvis 12/15/2019, radiograph 04/04/2011 FINDINGS: Low lung volumes. Vascular crowding with ectatic changes. No discernible pulmonary nodules or masses. Mild vascular congestion without frank edema. No consolidative opacity. Prominent cardiac silhouette though possibly accentuated by technique. The aorta is calcified. The  remaining cardiomediastinal  contours are unremarkable. No acute osseous or soft tissue abnormality. Degenerative changes are present in the imaged spine and shoulders. Telemetry leads overlie the chest. IMPRESSION: Low lung volumes, atelectasis and vascular congestion without frank edema. No discerning pulmonary nodules or masses though evaluation limited on portable radiographic technique. These results and limitations were discussed by telephone at the time of interpretation on 12/15/2019 at 10:32 pm to provider [facility] , who verbally acknowledged these results. Electronically Signed   By: Lovena Le M.D.   On: 12/15/2019 22:32   Procedure: 63f foley removed without issue.  Area prepped and draped in sterile fashion.  18 coude placed easily with urine return. As foley balloon was being inflated a pin hole was noted in the posterior aspect of the foley causing the saline to leak out of the catheter.  Foley removed.  New 18 coude placed easily and balloon successfully inflated with 10cc. Pt tolerated well.   Impression/Recommendation  Urinary retention--likely BOO due to BPH as his prostate is very large on CT scan.  His bladder has stretched over time to accommodate the obstruction so his bladder probably will not function properly even after the obstruction is addressed. He had no pain or urge to void prior to 2 L being drained out of his bladder which indicates this issue had been present for a prolonged period of time. Leave foley  until after he has recovered from all of his other current issues (at least 7 days) at which time he could have a void trial.  He will likely need urodynamics which can be done as an outpt. Continue Flomax.  Long term options will be discussed with the pt (chronic foley, CIC, etc) if he continues to retain large volumes of urine.   Hematuria--due to stretch injury of bladder and foley placement through enlarged prostate.  Larger foley placed.  Irrigate prn.  Should  resolve on its own.  His anemia is more likely associated with his GI bleed but monitor closely.   AKI--partially due to obstruction.  Improving with foley drainage.   Debbrah Alar 12/17/2019, 2:16 PM

## 2019-12-17 NOTE — Transfer of Care (Signed)
Immediate Anesthesia Transfer of Care Note  Patient: Steven Vargas  Procedure(s) Performed: ESOPHAGOGASTRODUODENOSCOPY (EGD) ([name] ) BIOPSY  Patient Location: PACU and Endoscopy Unit  Anesthesia Type:MAC  Level of Consciousness: awake, alert  and oriented  Airway & Oxygen Therapy: Patient Spontanous Breathing and Patient connected to nasal cannula oxygen  Post-op Assessment: Report given to RN, Post -op Vital signs reviewed and stable and Patient moving all extremities  Post vital signs: Reviewed and stable  Last Vitals:  Vitals Value Taken Time  BP    Temp    Pulse 93 12/17/19 1005  Resp 19 12/17/19 1005  SpO2 100 % 12/17/19 1005  Vitals shown include unvalidated device data.  Last Pain:  Vitals:   12/17/19 1005  TempSrc: (P) Axillary  PainSc:          Complications: No complications documented.

## 2019-12-17 NOTE — Progress Notes (Signed)
PROGRESS NOTE  Steven Vargas  DOB: 25-Oct-1935  PCP: Wendie Agreste, MD XTK:[id number]  DOA: 12/15/2019  LOS: 1 day   Chief Complaint  Patient presents with  . Constipation   Brief narrative: Steven Vargas is a 84 y.o. male who presented to the ED on 12/15/2019 with worsening epigastric pain, constipation, blood in stool and progressive weakness for last 3 weeks. 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 Over the last several weeks, patient had melanotic stool, epigastric pain particularly after meals, dysphagia to solids.  He feels food stuck and then induces vomiting as a result.  He noticed a small amount of red blood after inducing emesis 3 weeks ago.  Denies any unexplained weight loss. Not on blood thinners.  Takes ibuprofen infrequently.  Reports history of constipation and sometimes requires manual disimpaction.  He has noticed small onset bright red blood per rectum after disimpaction with stools. Of note he had EGD and colonoscopy in 2013 for hematochezia and history of gastric ulcer.  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  Labs on admission showed hemoglobin of 5.6, WBC count of 19.4, creatinine elevated 2.02 from baseline 1.12 Fecal occult blood test positive. CT abdomen pelvis showed  -Marked distention of urinary bladder and moderate bilateral hydronephrosis concerning for neurogenic bladder versus bladder outlet obstruction.  -features concerning for chronic cholecystitis.   He was given 2 units of PRBC transfusion.  Started on PPI drip.  GI consulted Admitted to hospitalist service  Of note, a Foley catheter was inserted in the ED which relieved 2.5 L of yellow-colored urine. Next morning, 550 mL of bloody urine was noted in indwelling catheter.    Subjective: Patient was seen and examined  this afternoon.  Underwent EGD this morning.  Noted to have acute gastritis and nonbleeding gastric ulcers.   Foley catheter continues to drain bloody urine.  Assessment/Plan: Acute anemia -Hemoglobin 5.2, likely from GI bleeding. -Underwent 2 units of PRBC transfusion.  Hemoglobin improved to 8 subsequently but again dropped down to 6.2 this morning.  1 more unit of PRBC given with improvement in hemoglobin to 7.7. Recent Labs    12/15/19 1949 12/15/19 1949 12/16/19 0739 12/17/19 0040 12/17/19 0741  HGB 5.2*  --  8.0* 6.2* 7.7*  MCV 93.4   < > 93.3 94.1  --    < > = values in this interval not displayed.   Acute GI bleeding -Suspect upper based on history of epigastric pain, one episode of hematemesis 3 weeks ago. -GI consult appreciated.  Underwent EGD this morning.  Noted to have acute gastritis and nonbleeding gastric ulcers.  On clear liquid diet. -Defer to GI for any need of colonoscopy.  AKI -Baseline creatinine 1.1, presented with a creatinine of 2.02. -Prerenal from anemia.  May also have component of postrenal etiology because of urinary obstruction. -Improving creatinine.  Continue to monitor. Recent Labs    04/20/19 1126 10/21/19 1113 12/15/19 1949 12/16/19 0739 12/17/19 0040  BUN 17 13 50* 46* 35*  CREATININE 1.05 1.10 2.02* 1.97* 1.79*   Acute urinary retention Hematuria -Patient reports history of lower urinary tract symptoms for several years but no established diagnosis of BPH -CT scan of abdomen finding as above showing urinary bladder distention and bilateral hydronephrosis.  Foley catheter inserted.   -2.5 L of clear yellow urine was drained initially in the ED.  Later next  morning, 550 mL of blood-tinged urine was drained as well.   -For last 24 hours, patient continues to have blood in urine with significant drop in hemoglobin as well.  Urology consultation called. -I also started the patient on Flomax.  Leukocytosis -Possible chronic cholecystitis  seen in the CAT scan.   -Currently on empiric antibiotics.  Leukocytosis improving. Recent Labs  Lab 12/15/19 1949 12/16/19 0739 12/17/19 0040  WBC 19.4* 18.4* 14.2*   Essential hypertension  -Home meds include amlodipine 10 mg daily, lisinopril 10 mg daily and hydrochlorothiazide 12.5 mg daily.  -Blood pressure currently controlled on amlodipine.  HCTZ and lisinopril on hold because of AKI.  -Continue IV hydralazine as needed.   History of prediabetes  -A1c 5.9 on 9/22. -we will follow CBGs closely. Recent Labs  Lab 12/16/19 1212 12/16/19 1851 12/16/19 2350 12/17/19 0623 12/17/19 0808  GLUCAP 122* 127* 100* 96 106*   Mobility: At home, he was able to ambulate without a walker or cane.  PT ordered. Code Status:   Code Status: Full Code  Nutritional status: Body mass index is 32.96 kg/m.     Diet Order            Diet clear liquid Room service appropriate? Yes; Fluid consistency: Thin  Diet effective now                 DVT prophylaxis: SCDs Start: 12/16/19 0123   Antimicrobials:  Zosyn IV Fluid: Okay to stop IV fluid today Consultants: GI Family Communication:  Family not at bedside  Status is: Inpatient  Remains inpatient appropriate because: May need neurological work-up, needs hemoglobin monitoring, continuous IV fluid.  Dispo: The patient is from: Home              Anticipated d/c is to: Home versus SNF, pending PT eval              Anticipated d/c date is: 1 to 2 days              Patient currently is not medically stable to d/c.    Infusions:  . pantoprozole (PROTONIX) infusion 8 mg/hr (12/17/19 0324)  . piperacillin-tazobactam (ZOSYN)  IV 3.375 g (12/17/19 0148)    Scheduled Meds: . Chlorhexidine Gluconate Cloth  6 each Topical Daily  . [START ON 12/19/2019] pantoprazole  40 mg Intravenous Q12H  . tamsulosin  0.4 mg Oral Daily    Antimicrobials: Anti-infectives (From admission, onward)   Start     Dose/Rate Route Frequency Ordered Stop     12/16/19 1000  piperacillin-tazobactam (ZOSYN) IVPB 3.375 g        3.375 g 12.5 mL/hr over 240 Minutes Intravenous Every 8 hours 12/16/19 0403     12/16/19 0415  piperacillin-tazobactam (ZOSYN) IVPB 3.375 g        3.375 g 100 mL/hr over 30 Minutes Intravenous  Once 12/16/19 0403 12/16/19 0519      PRN meds: acetaminophen **OR** acetaminophen, hydrALAZINE   Objective: Vitals:   12/17/19 1025 12/17/19 1106  BP: (!) 158/77 (!) 129/92  Pulse: 93 97  Resp: 13 18  Temp:  97.7 F (36.5 C)  SpO2: 98% 100%    Intake/Output Summary (Last 24 hours) at 12/17/2019 1154 Last data filed at 12/17/2019 1005 Gross per 24 hour  Intake 2384.96 ml  Output 2500 ml  Net -115.04 ml   Filed Weights   12/15/19 1943 12/17/19 0917  Weight: 90 kg 87.1 kg   Weight change:  Body mass index  is 32.96 kg/m.   Physical Exam:  General exam: Not in physical distress.  Foley catheter with blood in urine Skin: No rashes, lesions or ulcers. HEENT: Atraumatic, normocephalic, supple neck, no obvious bleeding Lungs: Clear to auscultation bilaterally CVS: Regular rate and rhythm, no murmur GI/Abd soft, nontender, nondistended, bowel sound present. CNS: Alert, awake, oriented x3 Psychiatry: Mood appropriate Extremities: No pedal edema, no calf tenderness  Data Review: I have personally reviewed the laboratory data and studies available.  Recent Labs  Lab 12/15/19 1949 12/16/19 0739 12/17/19 0040 12/17/19 0741  WBC 19.4* 18.4* 14.2*  --   NEUTROABS  --  14.8* 10.7*  --   HGB 5.2* 8.0* 6.2* 7.7*  HCT 17.1* 25.0* 19.2* 23.2*  MCV 93.4 93.3 94.1  --   PLT 313 190 200  --    Recent Labs  Lab 12/15/19 1949 12/16/19 0739 12/17/19 0040  NA 141 144 140  K 3.4* 3.8 3.2*  CL 105 110 107  CO2 26 25 26   GLUCOSE 156* 131* 107*  BUN 50* 46* 35*  CREATININE 2.02* 1.97* 1.79*  CALCIUM 8.6* 7.8* 7.2*  MG  --   --  1.9  PHOS  --   --  3.8    F/u labs ordered.  Signed, Terrilee Croak, MD [facility] 12/17/2019

## 2019-12-17 NOTE — Progress Notes (Addendum)
..  CRITICAL VALUE   CRITICAL VALUE: Hgb 6.2  RECEIVER (on-site recipient of call): Leonarda Salon RN   DATE & TIME NOTIFIED: 12/17/2019 @ 0138  MESSENGER (representative from lab): Clarene Critchley  MD NOTIFIED: Dr.Opyd  TIME OF NOTIFICATION: 0141  RESPONSE: Orders acknowledged. Will continue to monitor

## 2019-12-17 NOTE — Interval H&P Note (Signed)
History and Physical Interval Note:  12/17/2019 9:23 AM  Steven Vargas  has presented today for surgery, with the diagnosis of melena, anemia.  The various methods of treatment have been discussed with the patient and family. After consideration of risks, benefits and other options for treatment, the patient has consented to  Procedure(s): ESOPHAGOGASTRODUODENOSCOPY (EGD) ([name]) as a surgical intervention.  The patient's history has been reviewed, patient examined, no change in status, stable for surgery.  I have reviewed the patient's chart and labs.  Questions were answered to the patient's satisfaction.     [name]

## 2019-12-17 NOTE — TOC Initial Note (Signed)
Transition of Care [facility]) - Initial/Assessment Note    Patient Details  Name: Steven Vargas MRN: [id number] Date of Birth: 1936-01-16  [facility] CM/SW Contact:    Verdell Carmine, RN Phone Number: 12/17/2019, 2:12 PM  Clinical Narrative:                 Patient admitted for GI bleed. Requiring transfusion.  Lives with wife , ambulates without assistive devices normally. . EGD revealed gastric ulcers, biopsies were taken PPI. NO HH or DME needed at this time, CM will follow for any needs.   Expected Discharge Plan: Home/Self Care Barriers to Discharge: Continued Medical Work up   Patient Goals and CMS Choice        Expected Discharge Plan and Services Expected Discharge Plan: Home/Self Care   Discharge Planning Services: CM Consult   Living arrangements for the past 2 months: Single Family Home                                      Prior Living Arrangements/Services Living arrangements for the past 2 months: Single Family Home Lives with:: Spouse Patient language and need for interpreter reviewed:: Yes        Need for Family Participation in Patient Care: Yes (Comment) Care giver support system in place?: Yes (comment)   Criminal Activity/Legal Involvement Pertinent to Current Situation/Hospitalization: No - Comment as needed  Activities of Daily Living Home Assistive Devices/Equipment: None ADL Screening (condition at time of admission) Patient's cognitive ability adequate to safely complete daily activities?: Yes Is the patient deaf or have difficulty hearing?: No Does the patient have difficulty seeing, even when wearing glasses/contacts?: No Does the patient have difficulty concentrating, remembering, or making decisions?: No Patient able to express need for assistance with ADLs?: Yes Does the patient have difficulty dressing or bathing?: Yes Independently performs ADLs?: Yes (appropriate for developmental age) Does the patient have  difficulty walking or climbing stairs?: No Weakness of Legs: None Weakness of Arms/Hands: None  Permission Sought/Granted                  Emotional Assessment       Orientation: : Oriented to Self, Oriented to Place, Oriented to  Time, Oriented to Situation Alcohol / Substance Use: Not Applicable Psych Involvement: No (comment)  Admission diagnosis:  Urinary retention [R33.9] Acute GI bleeding [K92.2] Acute kidney injury ([place]) [N17.9] Symptomatic anemia [D64.9] Gastrointestinal hemorrhage, unspecified gastrointestinal hemorrhage type [K92.2] Patient Active Problem List   Diagnosis Date Noted  . ARF (acute renal failure) ([place]) 12/16/2019  . Diverticulosis of colon (without mention of hemorrhage) 04/06/2011  . Benign neoplasm of colon 04/06/2011  . Diverticulosis of colon with hemorrhage 04/06/2011  . Diaphragmatic hernia without mention of obstruction or gangrene 04/06/2011  . Hyperglycemia 04/05/2011  . Syncope and collapse 04/05/2011  . Ankle fracture, lateral malleolus, closed 04/05/2011  . Acute posthemorrhagic anemia 04/05/2011  . Blood in stool 04/05/2011  . Acute GI bleeding 04/05/2011   PCP:  Wendie Agreste, MD Pharmacy:   [facility], [address], [address] [place], [place] [address] Phone: [phone] Fax: [phone]     Social Determinants of Health (SDOH) Interventions    Readmission Risk Interventions No flowsheet data found.

## 2019-12-17 NOTE — Anesthesia Postprocedure Evaluation (Signed)
Anesthesia Post Note  Patient: Steven Vargas  Procedure(s) Performed: ESOPHAGOGASTRODUODENOSCOPY (EGD) ([name] ) BIOPSY     Patient location during evaluation: Endoscopy Anesthesia Type: MAC Level of consciousness: awake and alert Pain management: pain level controlled Vital Signs Assessment: post-procedure vital signs reviewed and stable Respiratory status: spontaneous breathing, nonlabored ventilation, respiratory function stable and patient connected to nasal cannula oxygen Cardiovascular status: blood pressure returned to baseline and stable Postop Assessment: no apparent nausea or vomiting Anesthetic complications: no   No complications documented.  Last Vitals:  Vitals:   12/17/19 1025 12/17/19 1106  BP: (!) 158/77 (!) 129/92  Pulse: 93 97  Resp: 13 18  Temp:  36.5 C  SpO2: 98% 100%    Last Pain:  Vitals:   12/17/19 1025  TempSrc:   PainSc: 0-No pain                 Barnet Glasgow

## 2019-12-18 DIAGNOSIS — K922 Gastrointestinal hemorrhage, unspecified: Secondary | ICD-10-CM | POA: Diagnosis not present

## 2019-12-18 LAB — GLUCOSE, CAPILLARY
Glucose-Capillary: 99 mg/dL (ref 70–99)
Glucose-Capillary: 109 mg/dL — ABNORMAL HIGH (ref 70–99)
Glucose-Capillary: 94 mg/dL (ref 70–99)
Glucose-Capillary: 105 mg/dL — ABNORMAL HIGH (ref 70–99)

## 2019-12-18 LAB — TYPE AND SCREEN
ABO/RH(D): O POS
Antibody Screen: NEGATIVE
Unit division: 0
Unit division: 0
Unit division: 0

## 2019-12-18 LAB — BPAM RBC
Blood Product Expiration Date: 202111242359
Blood Product Expiration Date: 202112172359
Blood Product Expiration Date: 202112172359
ISSUE DATE / TIME: 202111162331
ISSUE DATE / TIME: 202111170226
ISSUE DATE / TIME: 202111180240
Unit Type and Rh: 5100
Unit Type and Rh: 5100
Unit Type and Rh: 5100

## 2019-12-18 LAB — BASIC METABOLIC PANEL
Anion gap: 6 (ref 5–15)
BUN: 19 mg/dL (ref 8–23)
CO2: 28 mmol/L (ref 22–32)
Calcium: 7.1 mg/dL — ABNORMAL LOW (ref 8.9–10.3)
Chloride: 107 mmol/L (ref 98–111)
Creatinine, Ser: 1.59 mg/dL — ABNORMAL HIGH (ref 0.61–1.24)
GFR, Estimated: 43 mL/min — ABNORMAL LOW (ref >60)
Glucose, Bld: 98 mg/dL (ref 70–99)
Potassium: 3 mmol/L — ABNORMAL LOW (ref 3.5–5.1)
Sodium: 141 mmol/L (ref 135–145)

## 2019-12-18 LAB — CBC WITH DIFFERENTIAL/PLATELET
Abs Immature Granulocytes: 0.07 10*3/uL (ref 0.00–0.07)
Basophils Absolute: 0 10*3/uL (ref 0.0–0.1)
Basophils Relative: 0 %
Eosinophils Absolute: 0.4 10*3/uL (ref 0.0–0.5)
Eosinophils Relative: 3 %
HCT: 22.9 % — ABNORMAL LOW (ref 39.0–52.0)
Hemoglobin: 7.5 g/dL — ABNORMAL LOW (ref 13.0–17.0)
Immature Granulocytes: 1 %
Lymphocytes Relative: 17 %
Lymphs Abs: 2 10*3/uL (ref 0.7–4.0)
MCH: 30 pg (ref 26.0–34.0)
MCHC: 32.8 g/dL (ref 30.0–36.0)
MCV: 91.6 fL (ref 80.0–100.0)
Monocytes Absolute: 1.1 10*3/uL — ABNORMAL HIGH (ref 0.1–1.0)
Monocytes Relative: 9 %
Neutro Abs: 8.1 10*3/uL — ABNORMAL HIGH (ref 1.7–7.7)
Neutrophils Relative %: 70 %
Platelets: 224 10*3/uL (ref 150–400)
RBC: 2.5 MIL/uL — ABNORMAL LOW (ref 4.22–5.81)
RDW: 19.4 % — ABNORMAL HIGH (ref 11.5–15.5)
WBC: 11.6 10*3/uL — ABNORMAL HIGH (ref 4.0–10.5)
nRBC: 0 % (ref 0.0–0.2)

## 2019-12-18 LAB — SURGICAL PATHOLOGY

## 2019-12-18 MED ORDER — POTASSIUM CHLORIDE CRYS ER 20 MEQ PO TBCR
40.0000 meq | EXTENDED_RELEASE_TABLET | ORAL | Status: AC
  Administered 2019-12-18: 40 meq via ORAL
  Filled 2019-12-18: qty 2

## 2019-12-18 MED ORDER — SODIUM CHLORIDE 0.9 % IV SOLN: INTRAVENOUS | Status: DC

## 2019-12-18 MED ORDER — PANTOPRAZOLE SODIUM 40 MG IV SOLR
40.0000 mg | Freq: Two times a day (BID) | INTRAVENOUS | Status: DC
  Administered 2019-12-18 – 2019-12-19 (×2): 40 mg via INTRAVENOUS
  Filled 2019-12-18 (×2): qty 40

## 2019-12-18 NOTE — Progress Notes (Addendum)
PROGRESS NOTE  Steven Vargas  DOB: [birth year]  PCP: Wendie Agreste, MD JYN:[id number]  DOA: 12/15/2019  LOS: 2 days   Chief Complaint  Patient presents with  . Constipation   Brief narrative: Steven Vargas is a 84 y.o. male who presented to the ED on 12/15/2019 with worsening epigastric pain, constipation, blood in stool and progressive weakness for last 3 weeks. 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 Over the last several weeks, patient had melanotic stool, epigastric pain particularly after meals, dysphagia to solids.  He feels food stuck and then induces vomiting as a result.  He noticed a small amount of red blood after inducing emesis 3 weeks ago.  Denies any unexplained weight loss. Not on blood thinners.  Takes ibuprofen infrequently.  Reports history of constipation and sometimes requires manual disimpaction.  He has noticed small onset bright red blood per rectum after disimpaction with stools. Of note he had EGD and colonoscopy in 2013 for hematochezia and history of gastric ulcer.  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  Labs on admission showed hemoglobin of 5.6, WBC count of 19.4, creatinine elevated 2.02 from baseline 1.12 Fecal occult blood test positive. CT abdomen pelvis showed  -Marked distention of urinary bladder and moderate bilateral hydronephrosis concerning for neurogenic bladder versus bladder outlet obstruction.  -features concerning for chronic cholecystitis.   He was given 2 units of PRBC transfusion.  Started on PPI drip.  GI consulted Admitted to hospitalist service  Of note, a Foley catheter was inserted in the ED which relieved 2.5 L of yellow-colored urine. Next morning, 550 mL of bloody urine was noted in indwelling catheter.    Subjective: Patient was seen and examined  this morning.  Not in distress. Catheter with blood-tinged urine, some clots.  Overall improving.  Assessment/Plan: Acute anemia -Hemoglobin 5.2, likely from GI bleeding as well as hematuria -Underwent a total of 3 units of PRBC transfusion.  Hemoglobin at 7.5 this morning.   Recent Labs    12/15/19 1949 12/15/19 1949 12/16/19 0739 12/16/19 0739 12/17/19 0040 12/17/19 0741 12/18/19 0218  HGB 5.2*  --  8.0*  --  6.2* 7.7* 7.5*  MCV 93.4   < > 93.3   < > 94.1  --  91.6   < > = values in this interval not displayed.   Acute GI bleeding -GI consult appreciated.   -EGD 11/18 showed acute gastritis and nonbleeding gastric ulcers.  On clear liquid diet.  Advance to cardiac diet today.  Acute urinary retention Hematuria -Patient reports history of lower urinary tract symptoms for several years but no established diagnosis of BPH -CT scan of abdomen finding as above showing urinary bladder distention and bilateral hydronephrosis.  Foley catheter was inserted. -2.5 L of clear yellow urine was drained initially in the ED.  Later next morning, 550 mL of blood-tinged urine was drained as well.   -Urology consult appreciated..  According catheter was placed.  Patient continues to have hematuria but is improving. -Currently on Flomax and finasteride. -Neurology recommendation is to continue Foley catheter at discharge.  AKI -Baseline creatinine 1.1, presented with a creatinine of 2.02. -Likely related to anemia, diuretics and post renal obstruction.   -Improving creatinine. Continue to monitor. Recent Labs    04/20/19 1126 10/21/19 1113 12/15/19 1949 12/16/19 0739 12/17/19 0040 12/18/19 0218  BUN 17 13 50* 46* 35*  19  CREATININE 1.05 1.10 2.02* 1.97* 1.79* 1.59*   Hypokalemia  -Potassium level low at 3 today.  40 mEq oral placement x2 given. Recent Labs  Lab 12/15/19 1949 12/16/19 0739 12/17/19 0040 12/18/19 0218  K 3.4* 3.8 3.2* 3.0*  MG  --   --  1.9  --   PHOS  --   --   3.8  --    Leukocytosis -Possible chronic cholecystitis seen in the CAT scan.   -Currently on empiric antibiotics.  Leukocytosis improving. -Plan to complete 5 days of empiric antibiotics. Recent Labs  Lab 12/15/19 1949 12/16/19 0739 12/17/19 0040 12/18/19 0218  WBC 19.4* 18.4* 14.2* 11.6*   Essential hypertension  -Home meds include amlodipine 10 mg daily, lisinopril 10 mg daily and hydrochlorothiazide 12.5 mg daily.  -Blood pressure currently controlled on amlodipine.  HCTZ and lisinopril on hold because of AKI.  -Continue IV hydralazine as needed.   History of prediabetes  -A1c 5.9 on 9/22. -we will follow CBGs closely. Recent Labs  Lab 12/17/19 1212 12/17/19 1829 12/17/19 2345 12/18/19 0546 12/18/19 1142  GLUCAP 139* 116* 104* 99 109*   Mobility: Home health PT recommended by PT Code Status:   Code Status: Full Code  Nutritional status: Body mass index is 32.96 kg/m.     Diet Order            Diet Heart Room service appropriate? Yes; Fluid consistency: Thin  Diet effective now                 DVT prophylaxis: SCDs Start: 12/16/19 0123   Antimicrobials:  Zosyn IV Fluid: I will keep him on gentle hydration with normal saline at 50 mill per hour. Consultants: GI Family Communication:  Family not at bedside  Status is: Inpatient  Remains inpatient appropriate because: Continues IV hydration, amatory monitoring Dispo: The patient is from: Home              Anticipated d/c is to: Home with PT              Anticipated d/c date is: 1 to 2 days              Patient currently is not medically stable to d/c.    Infusions:  . sodium chloride    . pantoprozole (PROTONIX) infusion 8 mg/hr (12/18/19 1100)  . piperacillin-tazobactam (ZOSYN)  IV 3.375 g (12/18/19 0955)    Scheduled Meds: . Chlorhexidine Gluconate Cloth  6 each Topical Daily  . finasteride  5 mg Oral Daily  . [START ON 12/19/2019] pantoprazole  40 mg Intravenous Q12H  . tamsulosin  0.4 mg  Oral Daily    Antimicrobials: Anti-infectives (From admission, onward)   Start     Dose/Rate Route Frequency Ordered Stop   12/16/19 1000  piperacillin-tazobactam (ZOSYN) IVPB 3.375 g        3.375 g 12.5 mL/hr over 240 Minutes Intravenous Every 8 hours 12/16/19 0403 12/20/19 2359   12/16/19 0415  piperacillin-tazobactam (ZOSYN) IVPB 3.375 g        3.375 g 100 mL/hr over 30 Minutes Intravenous  Once 12/16/19 0403 12/16/19 0519      PRN meds: acetaminophen **OR** acetaminophen, hydrALAZINE, morphine injection   Objective: Vitals:   12/18/19 0725 12/18/19 1143  BP: 108/67 124/77  Pulse: 89 97  Resp: 19 17  Temp: 98.7 F (37.1 C) 99 F (37.2 C)  SpO2: 96% 93%    Intake/Output Summary (Last 24 hours) at 12/18/2019 1359 Last  data filed at 12/18/2019 1325 Gross per 24 hour  Intake 150.24 ml  Output 2050 ml  Net -1899.76 ml   Filed Weights   12/15/19 1943 12/17/19 0917  Weight: 90 kg 87.1 kg   Weight change:  Body mass index is 32.96 kg/m.   Physical Exam:  General exam: Not in physical distress.  Foley catheter with pinkish urine Skin: No rashes, lesions or ulcers. HEENT: Atraumatic, normocephalic, supple neck, no obvious bleeding Lungs: Clear to auscultation bilaterally CVS: Regular rate and rhythm, no murmur GI/Abd soft, nontender, nondistended, bowel sound present. CNS: Alert, awake monitor x3 Psychiatry: Mood appropriate Extremities: No pedal edema, no calf tenderness  Data Review: I have personally reviewed the laboratory data and studies available.  Recent Labs  Lab 12/15/19 1949 12/16/19 0739 12/17/19 0040 12/17/19 0741 12/18/19 0218  WBC 19.4* 18.4* 14.2*  --  11.6*  NEUTROABS  --  14.8* 10.7*  --  8.1*  HGB 5.2* 8.0* 6.2* 7.7* 7.5*  HCT 17.1* 25.0* 19.2* 23.2* 22.9*  MCV 93.4 93.3 94.1  --  91.6  PLT 313 190 200  --  224   Recent Labs  Lab 12/15/19 1949 12/16/19 0739 12/17/19 0040 12/18/19 0218  NA 141 144 140 141  K 3.4* 3.8 3.2* 3.0*    CL 105 110 107 107  CO2 26 25 26 28   GLUCOSE 156* 131* 107* 98  BUN 50* 46* 35* 19  CREATININE 2.02* 1.97* 1.79* 1.59*  CALCIUM 8.6* 7.8* 7.2* 7.1*  MG  --   --  1.9  --   PHOS  --   --  3.8  --     F/u labs ordered.  Signed, Terrilee Croak, MD [facility] 12/18/2019

## 2019-12-18 NOTE — Evaluation (Signed)
Physical Therapy Evaluation Patient Details Name: Steven Vargas MRN: [id number] DOB: [birth year] Today's Date: 12/18/2019   History of Present Illness  Pt is an 84 y.o. male admitted 12/15/19 with epigastric pain, constipation, hematochezia. Workup for AKI, hematuria, urinary retention, likely BOO due to BPH. S/p upper endoscopy 11/18. Other PMH includes HTN, obesity, stroke, GIB.    Clinical Impression  Pt presents with an overall decrease in functional mobility secondary to above. PTA, pt independent, lives with family and stays active. Today, pt moving fairly well with mild instability, requiring intermittent min guard for balance. Pt agreeable to work with acute PT services to maximize functional mobility and independence prior to d/c home, but pleasantly declining follow-up with HHPT services. Pt reports he will have necessary assist upon return home. Will follow acutely to address established goals.     Follow Up Recommendations Home health PT;Supervision for mobility/OOB (pt declined HHPT)    Equipment Recommendations  None recommended by PT    Recommendations for Other Services       Precautions / Restrictions Precautions Precautions: Fall Restrictions Weight Bearing Restrictions: No      Mobility  Bed Mobility Overal bed mobility: Needs Assistance Bed Mobility: Supine to Sit     Supine to sit: Min assist;HOB elevated     General bed mobility comments: MinA for UE support to elevate trunk and scoot hips to EOB    Transfers Overall transfer level: Needs assistance Equipment used: None Transfers: Sit to/from Stand Sit to Stand: Min guard         General transfer comment: Min guard for balance, self-corrected instability upon initial standing  Ambulation/Gait Ambulation/Gait assistance: Min guard Gait Distance (Feet): 40 Feet Assistive device: None Gait Pattern/deviations: Step-through pattern;Decreased stride length;Trunk flexed Gait velocity: Decreased    General Gait Details: Slow, mildly unsteady gait without DME, min guard for balance; pt declined further distance or walking outside of room secondary to fatigue  Stairs            Wheelchair Mobility    Modified Rankin (Stroke Patients Only)       Balance Overall balance assessment: Needs assistance   Sitting balance-Leahy Scale: Good       Standing balance-Leahy Scale: Fair                               Pertinent Vitals/Pain Pain Assessment: Faces Faces Pain Scale: Hurts a little bit Pain Location: Generalized Pain Descriptors / Indicators: Discomfort;Tiring Pain Intervention(s): Monitored during session    Home Living Family/patient expects to be discharged to:: Private residence Living Arrangements: Spouse/significant other;Children Available Help at Discharge: Family;Available 24 hours/day Type of Home: House Home Access: Stairs to enter Entrance Stairs-Rails: Right Entrance Stairs-Number of Steps: 2-3 Home Layout: One level Home Equipment: Grab bars - tub/shower Additional Comments: Lives with wife, children and grandchildren; multiple support family members nearby as well    Prior Function Level of Independence: Independent         Comments: Reports indep without DME, 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        Extremity/Trunk Assessment   Upper Extremity Assessment Upper Extremity Assessment: Generalized weakness            Communication   Communication: HOH  Cognition   Behavior During Therapy: WFL for tasks assessed/performed Overall Cognitive Status: Within Functional Limits for tasks assessed  General Comments: WFL for simple tasks, not formally assessed      General Comments      Exercises     Assessment/Plan    PT Assessment Patient needs continued PT services  PT Problem List Decreased strength;Decreased activity tolerance;Decreased  balance;Decreased mobility       PT Treatment Interventions DME instruction;Gait training;Stair training;Functional mobility training;Therapeutic activities;Therapeutic exercise;Balance training;Patient/family education    PT Goals (Current goals can be found in the Care Plan section)  Acute Rehab PT Goals Patient Stated Goal: Return home - "I need to get the leaves out of my yard" PT Goal Formulation: With patient Time For Goal Achievement: 01/01/20 Potential to Achieve Goals: Good    Frequency Min 3X/week   Barriers to discharge        Co-evaluation               AM-PAC PT "6 Clicks" Mobility  Outcome Measure Help needed turning from your back to your side while in a flat bed without using bedrails?: A Little Help needed moving from lying on your back to sitting on the side of a flat bed without using bedrails?: A Little Help needed moving to and from a bed to a chair (including a wheelchair)?: A Little Help needed standing up from a chair using your arms (e.g., wheelchair or bedside chair)?: A Little Help needed to walk in hospital room?: A Little Help needed climbing 3-5 steps with a railing? : A Little 6 Click Score: 18    End of Session Equipment Utilized During Treatment: Gait belt Activity Tolerance: Patient tolerated treatment well;Patient limited by fatigue Patient left: in chair;with call bell/phone within reach;with chair alarm set Nurse Communication: Mobility status PT Visit Diagnosis: Other abnormalities of gait and mobility (R26.89);Muscle weakness (generalized) (M62.81)    Time: 1023-1040 PT Time Calculation (min) (ACUTE ONLY): 17 min   Charges:   PT Evaluation $PT Eval Moderate Complexity: Bronson, PT, DPT Acute Rehabilitation Services  Pager [phone] Office [place] 12/18/2019, 11:35 AM

## 2019-12-18 NOTE — Progress Notes (Signed)
[facility] Gastroenterology Progress Note  Steven Vargas 84 y.o. Apr 07, 1935   Subjective: Denies abdominal pain. Feels good. Sitting in bedside chair. Tolerating liquid diet this morning.  Objective: Vital signs: Vitals:   12/18/19 0725 12/18/19 1143  BP: 108/67 124/77  Pulse: 89 97  Resp: 19 17  Temp: 98.7 F (37.1 C) 99 F (37.2 C)  SpO2: 96% 93%    Physical Exam: Gen: elderly, pleasant, alert, no acute distress  HEENT: anicteric sclera CV: RRR Chest: CTA B Abd: soft, nontender, nondistended, +BS Ext: no edema  Lab Results: Recent Labs    12/17/19 0040 12/18/19 0218  NA 140 141  K 3.2* 3.0*  CL 107 107  CO2 26 28  GLUCOSE 107* 98  BUN 35* 19  CREATININE 1.79* 1.59*  CALCIUM 7.2* 7.1*  MG 1.9  --   PHOS 3.8  --    Recent Labs    12/15/19 1949  AST 15  ALT 26  ALKPHOS 37*  BILITOT 0.4  PROT 5.1*  ALBUMIN 2.8*   Recent Labs    12/17/19 0040 12/17/19 0040 12/17/19 0741 12/18/19 0218  WBC 14.2*  --   --  11.6*  NEUTROABS 10.7*  --   --  8.1*  HGB 6.2*   < > 7.7* 7.5*  HCT 19.2*   < > 23.2* 22.9*  MCV 94.1  --   --  91.6  PLT 200  --   --  224   < > = values in this interval not displayed.      Assessment/Plan: Gastric ulcers - biopsies benign and negative for H. Pylori. Advance diet. Changed PPI to IV Q 12 hours. Needs PPI PO BID X 3 months as outpt and then QD. Will do repeat EGD in 3 months to evaluate healing. Will sign off. Call if questions.   [name] 12/18/2019, 3:17 PM  Questions please call [phone] ID: [name], male   DOB: 12-23-1935, 84 y.o.   MRN: [id number]

## 2019-12-19 DIAGNOSIS — K922 Gastrointestinal hemorrhage, unspecified: Secondary | ICD-10-CM | POA: Diagnosis not present

## 2019-12-19 LAB — BASIC METABOLIC PANEL
Anion gap: 8 (ref 5–15)
BUN: 12 mg/dL (ref 8–23)
CO2: 28 mmol/L (ref 22–32)
Calcium: 7.1 mg/dL — ABNORMAL LOW (ref 8.9–10.3)
Chloride: 104 mmol/L (ref 98–111)
Creatinine, Ser: 1.49 mg/dL — ABNORMAL HIGH (ref 0.61–1.24)
GFR, Estimated: 47 mL/min — ABNORMAL LOW (ref >60)
Glucose, Bld: 102 mg/dL — ABNORMAL HIGH (ref 70–99)
Potassium: 3.5 mmol/L (ref 3.5–5.1)
Sodium: 140 mmol/L (ref 135–145)

## 2019-12-19 LAB — CBC WITH DIFFERENTIAL/PLATELET
Abs Immature Granulocytes: 0.05 10*3/uL (ref 0.00–0.07)
Basophils Absolute: 0.1 10*3/uL (ref 0.0–0.1)
Basophils Relative: 1 %
Eosinophils Absolute: 0.5 10*3/uL (ref 0.0–0.5)
Eosinophils Relative: 5 %
HCT: 22.6 % — ABNORMAL LOW (ref 39.0–52.0)
Hemoglobin: 7.3 g/dL — ABNORMAL LOW (ref 13.0–17.0)
Immature Granulocytes: 1 %
Lymphocytes Relative: 18 %
Lymphs Abs: 1.8 10*3/uL (ref 0.7–4.0)
MCH: 30 pg (ref 26.0–34.0)
MCHC: 32.3 g/dL (ref 30.0–36.0)
MCV: 93 fL (ref 80.0–100.0)
Monocytes Absolute: 1 10*3/uL (ref 0.1–1.0)
Monocytes Relative: 9 %
Neutro Abs: 6.9 10*3/uL (ref 1.7–7.7)
Neutrophils Relative %: 66 %
Platelets: 235 10*3/uL (ref 150–400)
RBC: 2.43 MIL/uL — ABNORMAL LOW (ref 4.22–5.81)
RDW: 18.7 % — ABNORMAL HIGH (ref 11.5–15.5)
WBC: 10.3 10*3/uL (ref 4.0–10.5)
nRBC: 0 % (ref 0.0–0.2)

## 2019-12-19 LAB — GLUCOSE, CAPILLARY
Glucose-Capillary: 95 mg/dL (ref 70–99)
Glucose-Capillary: 91 mg/dL (ref 70–99)
Glucose-Capillary: 107 mg/dL — ABNORMAL HIGH (ref 70–99)

## 2019-12-19 MED ORDER — PANTOPRAZOLE SODIUM 40 MG PO TBEC
40.0000 mg | DELAYED_RELEASE_TABLET | Freq: Two times a day (BID) | ORAL | Status: DC
  Administered 2019-12-19 – 2019-12-20 (×3): 40 mg via ORAL
  Filled 2019-12-19 (×2): qty 1

## 2019-12-19 NOTE — Progress Notes (Signed)
PROGRESS NOTE  Steven Vargas  DOB: 05-18-1935  PCP: Wendie Agreste, MD WYO:[id number]  DOA: 12/15/2019  LOS: 3 days   Chief Complaint  Patient presents with  . Constipation   Brief narrative: Steven Vargas is a 84 y.o. male who presented to the ED on 12/15/2019 with worsening epigastric pain, constipation, blood in stool and progressive weakness for last 3 weeks. 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 Over the last several weeks, patient had melanotic stool, epigastric pain particularly after meals, dysphagia to solids.  He feels food stuck and then induces vomiting as a result.  He noticed a small amount of red blood after inducing emesis 3 weeks ago.  Denies any unexplained weight loss. Not on blood thinners.  Takes ibuprofen infrequently.  Reports history of constipation and sometimes requires manual disimpaction.  He has noticed small onset bright red blood per rectum after disimpaction with stools. Of note he had EGD and colonoscopy in 2013 for hematochezia and history of gastric ulcer.  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  Labs on admission showed hemoglobin of 5.6, WBC count of 19.4, creatinine elevated 2.02 from baseline 1.12 Fecal occult blood test positive. CT abdomen pelvis showed  -Marked distention of urinary bladder and moderate bilateral hydronephrosis concerning for neurogenic bladder versus bladder outlet obstruction.  -features concerning for chronic cholecystitis.   He was given 2 units of PRBC transfusion.  Started on PPI drip.  GI consulted Admitted to hospitalist service  Of note, a Foley catheter was inserted in the ED which relieved 2.5 L of yellow-colored urine. Next morning, 550 mL of bloody urine was noted in indwelling catheter.    Subjective: Patient was seen and examined  this morning.  Not in distress. Continues to have blood-tinged urine in the catheter with some clots. Creatinine improving.  Hemoglobin at 7.3  Assessment/Plan: Acute anemia -Hemoglobin was 5.2 on admission, likely from GI bleeding as well as hematuria -Underwent a total of 3 units of PRBC transfusion.  Hemoglobin at 7.3 this morning.  Drifting down likely because of ongoing hematuria.  Bowel movement normal. Recent Labs    12/16/19 0739 12/16/19 0739 12/17/19 0040 12/17/19 0040 12/17/19 0741 12/18/19 0218 12/19/19 0037  HGB 8.0*  --  6.2*  --  7.7* 7.5* 7.3*  MCV 93.3   < > 94.1   < >  --  91.6 93.0   < > = values in this interval not displayed.   Acute GI bleeding -GI consult appreciated.   -EGD 11/18 showed acute gastritis and nonbleeding gastric ulcers. -Continue PPI.  Acute urinary retention Hematuria -Patient reports history of lower urinary tract symptoms for several years but no established diagnosis of BPH -CT scan of abdomen finding as above showing urinary bladder distention and bilateral hydronephrosis.  Foley catheter was inserted. -2.5 L of clear yellow urine was drained initially in the ED.  Later next morning, 550 mL of blood-tinged urine was drained as well.   -Urology consult appreciated..  According catheter was placed.  Patient continues to have hematuria but is improving.  Continue to monitor. -Currently on Flomax and finasteride. -Urology recommendation is to continue Foley catheter at discharge.  AKI -Baseline creatinine 1.1, presented with a creatinine of 2.02. -Likely related to anemia, diuretics and post renal obstruction.   -Improving creatinine. Continue normal saline at 50 mill per hour. Recent Labs  04/20/19 1126 10/21/19 1113 12/15/19 1949 12/16/19 0739 12/17/19 0040 12/18/19 0218 12/19/19 0037  BUN 17 13 50* 46* 35* 19 12  CREATININE 1.05 1.10 2.02* 1.97* 1.79* 1.59* 1.49*   Hypokalemia  -Potassium level normalized today. Recent  Labs  Lab 12/15/19 1949 12/16/19 0739 12/17/19 0040 12/18/19 0218 12/19/19 0037  K 3.4* 3.8 3.2* 3.0* 3.5  MG  --   --  1.9  --   --   PHOS  --   --  3.8  --   --    Leukocytosis -Possible chronic cholecystitis seen in the CAT scan.   -Currently on empiric antibiotics.  Leukocytosis improving. -Plan to complete 5 days of empiric antibiotics.  WBC count normalized today. Recent Labs  Lab 12/15/19 1949 12/16/19 0739 12/17/19 0040 12/18/19 0218 12/19/19 0037  WBC 19.4* 18.4* 14.2* 11.6* 10.3   Essential hypertension  -Home meds include amlodipine 10 mg daily, lisinopril 10 mg daily and hydrochlorothiazide 12.5 mg daily.  -Blood pressure currently is controlled on amlodipine.  HCTZ and lisinopril on hold because of AKI.  -Continue IV hydralazine as needed.   History of prediabetes  -A1c 5.9 on 9/22. -we will follow CBGs closely. Recent Labs  Lab 12/18/19 0546 12/18/19 1142 12/18/19 1754 12/18/19 2323 12/19/19 0548  GLUCAP 99 109* 94 105* 95   Mobility: Home health PT recommended by PT Code Status:   Code Status: Full Code  Nutritional status: Body mass index is 32.96 kg/m.     Diet Order            Diet Heart Room service appropriate? Yes; Fluid consistency: Thin  Diet effective now                 DVT prophylaxis: SCDs Start: 12/16/19 0123   Antimicrobials:  Zosyn IV Fluid: Continue normal saline at 50 mill per hour. Consultants: GI Family Communication:  Family not at bedside  Status is: Inpatient  Remains inpatient appropriate because: Continues to have hematuria, drop in hemoglobin, continues IV fluid Dispo: The patient is from: Home              Anticipated d/c is to: Home with PT              Anticipated d/c date is: 1 to 2 days              Patient currently is not medically stable to d/c.    Infusions:  . sodium chloride 50 mL/hr at 12/18/19 1905  . piperacillin-tazobactam (ZOSYN)  IV 3.375 g (12/19/19 0540)    Scheduled Meds: .  Chlorhexidine Gluconate Cloth  6 each Topical Daily  . finasteride  5 mg Oral Daily  . pantoprazole  40 mg Oral BID  . tamsulosin  0.4 mg Oral Daily    Antimicrobials: Anti-infectives (From admission, onward)   Start     Dose/Rate Route Frequency Ordered Stop   12/16/19 1000  piperacillin-tazobactam (ZOSYN) IVPB 3.375 g        3.375 g 12.5 mL/hr over 240 Minutes Intravenous Every 8 hours 12/16/19 0403 12/21/19 0559   12/16/19 0415  piperacillin-tazobactam (ZOSYN) IVPB 3.375 g        3.375 g 100 mL/hr over 30 Minutes Intravenous  Once 12/16/19 0403 12/16/19 0519      PRN meds: acetaminophen **OR** acetaminophen, hydrALAZINE, morphine injection   Objective: Vitals:   12/19/19 0306 12/19/19 0834  BP: 130/73 140/69  Pulse: 77 100  Resp: 18 15  Temp: 98.4 F (36.9  C) 98.4 F (36.9 C)  SpO2: 95% 95%    Intake/Output Summary (Last 24 hours) at 12/19/2019 1112 Last data filed at 12/19/2019 0900 Gross per 24 hour  Intake 830 ml  Output 3150 ml  Net -2320 ml   Filed Weights   12/15/19 1943 12/17/19 0917  Weight: 90 kg 87.1 kg   Weight change:  Body mass index is 32.96 kg/m.   Physical Exam:  General exam: Not in physical distress. Foley catheter with pinkish urine Skin: No rashes, lesions or ulcers. HEENT: Atraumatic, normocephalic, supple neck, no obvious bleeding Lungs: Clear to auscultation bilaterally CVS: Regular rate and rhythm, no murmur GI/Abd soft, nontender, nondistended, bowel sound present CNS: Alert, awake, oriented x 3. Psychiatry: Mood appropriate Extremities: No pedal edema, no calf tenderness  Data Review: I have personally reviewed the laboratory data and studies available.  Recent Labs  Lab 12/15/19 1949 12/15/19 1949 12/16/19 0739 12/17/19 0040 12/17/19 0741 12/18/19 0218 12/19/19 0037  WBC 19.4*  --  18.4* 14.2*  --  11.6* 10.3  NEUTROABS  --   --  14.8* 10.7*  --  8.1* 6.9  HGB 5.2*   < > 8.0* 6.2* 7.7* 7.5* 7.3*  HCT 17.1*   < >  25.0* 19.2* 23.2* 22.9* 22.6*  MCV 93.4  --  93.3 94.1  --  91.6 93.0  PLT 313  --  190 200  --  224 235   < > = values in this interval not displayed.   Recent Labs  Lab 12/15/19 1949 12/16/19 0739 12/17/19 0040 12/18/19 0218 12/19/19 0037  NA 141 144 140 141 140  K 3.4* 3.8 3.2* 3.0* 3.5  CL 105 110 107 107 104  CO2 26 25 26 28 28   GLUCOSE 156* 131* 107* 98 102*  BUN 50* 46* 35* 19 12  CREATININE 2.02* 1.97* 1.79* 1.59* 1.49*  CALCIUM 8.6* 7.8* 7.2* 7.1* 7.1*  MG  --   --  1.9  --   --   PHOS  --   --  3.8  --   --     F/u labs ordered.  Signed, Terrilee Croak, MD [facility] 12/19/2019

## 2019-12-19 NOTE — Progress Notes (Signed)
Pharmacy Antibiotic Note  Steven Vargas is a 84 y.o. male admitted on 12/15/2019 with intra-abdominal infection.  Pharmacy has been consulted for Zosyn dosing. Patient will complete a total of 5 days of therapy, stop date is in place for 11/22. Patient's renal function has improved since admission. WBC are wnl and no fevers noted.   Plan: Zosyn 3.375g IV q8h (4 hour infusion).  Stop on 11/22 Monitor clinical status and renal function   Height: 5\' 4"  (162.6 cm) Weight: 87.1 kg (192 lb) IBW/kg (Calculated) : 59.2  Temp (24hrs), Avg:98.5 F (36.9 C), Min:98.3 F (36.8 C), Max:99 F (37.2 C)  Recent Labs  Lab 12/15/19 1949 12/16/19 0739 12/17/19 0040 12/18/19 0218 12/19/19 0037  WBC 19.4* 18.4* 14.2* 11.6* 10.3  CREATININE 2.02* 1.97* 1.79* 1.59* 1.49*    Estimated Creatinine Clearance: 38.1 mL/min (A) (by C-G formula based on SCr of 1.49 mg/dL (H)).    Allergies  Allergen Reactions  . Pravastatin Swelling and Other (See Comments)    Reported jaw and lip swelling along with myalgias in the back with pravastatin.  Symptoms resolved with stopping.     Thank you for allowing pharmacy to be a part of this patient's care.  Cephus Slater, PharmD, [facility] Resident [phone] 12/19/2019 10:06 AM

## 2019-12-20 DIAGNOSIS — K922 Gastrointestinal hemorrhage, unspecified: Secondary | ICD-10-CM | POA: Diagnosis not present

## 2019-12-20 LAB — CBC WITH DIFFERENTIAL/PLATELET
Abs Immature Granulocytes: 0.04 10*3/uL (ref 0.00–0.07)
Basophils Absolute: 0 10*3/uL (ref 0.0–0.1)
Basophils Relative: 0 %
Eosinophils Absolute: 0.6 10*3/uL — ABNORMAL HIGH (ref 0.0–0.5)
Eosinophils Relative: 6 %
HCT: 22.7 % — ABNORMAL LOW (ref 39.0–52.0)
Hemoglobin: 7.5 g/dL — ABNORMAL LOW (ref 13.0–17.0)
Immature Granulocytes: 0 %
Lymphocytes Relative: 14 %
Lymphs Abs: 1.4 10*3/uL (ref 0.7–4.0)
MCH: 30.7 pg (ref 26.0–34.0)
MCHC: 33 g/dL (ref 30.0–36.0)
MCV: 93 fL (ref 80.0–100.0)
Monocytes Absolute: 0.8 10*3/uL (ref 0.1–1.0)
Monocytes Relative: 8 %
Neutro Abs: 7 10*3/uL (ref 1.7–7.7)
Neutrophils Relative %: 72 %
Platelets: 255 10*3/uL (ref 150–400)
RBC: 2.44 MIL/uL — ABNORMAL LOW (ref 4.22–5.81)
RDW: 18.1 % — ABNORMAL HIGH (ref 11.5–15.5)
WBC: 9.9 10*3/uL (ref 4.0–10.5)
nRBC: 0 % (ref 0.0–0.2)

## 2019-12-20 LAB — BASIC METABOLIC PANEL
Anion gap: 9 (ref 5–15)
BUN: 11 mg/dL (ref 8–23)
CO2: 27 mmol/L (ref 22–32)
Calcium: 7.2 mg/dL — ABNORMAL LOW (ref 8.9–10.3)
Chloride: 104 mmol/L (ref 98–111)
Creatinine, Ser: 1.69 mg/dL — ABNORMAL HIGH (ref 0.61–1.24)
GFR, Estimated: 40 mL/min — ABNORMAL LOW (ref >60)
Glucose, Bld: 146 mg/dL — ABNORMAL HIGH (ref 70–99)
Potassium: 3.1 mmol/L — ABNORMAL LOW (ref 3.5–5.1)
Sodium: 140 mmol/L (ref 135–145)

## 2019-12-20 LAB — PHOSPHORUS: Phosphorus: 3.5 mg/dL (ref 2.5–4.6)

## 2019-12-20 LAB — GLUCOSE, CAPILLARY
Glucose-Capillary: 102 mg/dL — ABNORMAL HIGH (ref 70–99)
Glucose-Capillary: 104 mg/dL — ABNORMAL HIGH (ref 70–99)

## 2019-12-20 LAB — MAGNESIUM: Magnesium: 1.7 mg/dL (ref 1.7–2.4)

## 2019-12-20 MED ORDER — PANTOPRAZOLE SODIUM 40 MG PO TBEC: 40.0000 mg | DELAYED_RELEASE_TABLET | Freq: Two times a day (BID) | ORAL | 2 refills | Status: DC

## 2019-12-20 MED ORDER — FINASTERIDE 5 MG PO TABS: 5.0000 mg | ORAL_TABLET | Freq: Every day | ORAL | 0 refills | Status: DC

## 2019-12-20 MED ORDER — PANTOPRAZOLE SODIUM 40 MG PO TBEC: 40.0000 mg | DELAYED_RELEASE_TABLET | Freq: Two times a day (BID) | ORAL | 0 refills | Status: DC

## 2019-12-20 MED ORDER — TAMSULOSIN HCL 0.4 MG PO CAPS: 0.4000 mg | ORAL_CAPSULE | Freq: Every day | ORAL | 0 refills | Status: AC

## 2019-12-20 MED ORDER — TAMSULOSIN HCL 0.4 MG PO CAPS: 0.4000 mg | ORAL_CAPSULE | Freq: Every day | ORAL | 0 refills | Status: DC

## 2019-12-20 MED ORDER — POTASSIUM CHLORIDE CRYS ER 20 MEQ PO TBCR
40.0000 meq | EXTENDED_RELEASE_TABLET | ORAL | Status: AC
  Administered 2019-12-20 (×2): 40 meq via ORAL
  Filled 2019-12-20 (×2): qty 2

## 2019-12-20 MED ORDER — FINASTERIDE 5 MG PO TABS: 5.0000 mg | ORAL_TABLET | Freq: Every day | ORAL | 0 refills | Status: AC

## 2019-12-20 NOTE — Plan of Care (Signed)
  Problem: Education: Goal: Ability to identify signs and symptoms of gastrointestinal bleeding will improve Outcome: Adequate for Discharge   Problem: Bowel/Gastric: Goal: Will show no signs and symptoms of gastrointestinal bleeding Outcome: Adequate for Discharge   Problem: Fluid Volume: Goal: Will show no signs and symptoms of excessive bleeding Outcome: Adequate for Discharge   Problem: Clinical Measurements: Goal: Complications related to the disease process, condition or treatment will be avoided or minimized Outcome: Adequate for Discharge   

## 2019-12-20 NOTE — TOC Transition Note (Signed)
Transition of Care [facility]) - CM/SW Discharge Note   Patient Details  Name: Steven Vargas MRN: [id number] Date of Birth: December 18, 1935  Transition of Care [facility]) CM/SW Contact:  Carles Collet, RN Phone Number: 12/20/2019, 10:59 AM   Clinical Narrative:   Patient declined [place] [facility]      Barriers to Discharge: Continued Medical Work up   Patient Goals and CMS Choice        Discharge Placement                       Discharge Plan and Services   Discharge Planning Services: CM Consult                                 Social Determinants of Health (SDOH) Interventions     Readmission Risk Interventions No flowsheet data found.

## 2019-12-20 NOTE — Progress Notes (Signed)
Discharge instructions reviewed with patient, IV removal well tolerated, foley left in place, care explained, patient transferred by family car.

## 2019-12-20 NOTE — Discharge Instructions (Signed)
Acute Urinary Retention, Male  Acute urinary retention is a condition in which a person is unable to pass urine. This can last for a short time or for a long time. If left untreated, it can result in kidney damage or other serious complications. What are the causes? This condition may be caused by:  Obstruction or narrowing of the tube that drains the bladder (urethra). This may be caused by surgery or problems with nearby organs, such as the prostate gland, which can press or squeeze the urethra.  Problems with the nerves in the bladder. These can be caused by diseases, such as multiple sclerosis, or by spinal cord injuries.  Certain medicines.  Tumors in the area of the pelvis, bladder, or urethra.  Diabetes.  Degenerative cognitive conditions such as delirium or dementia.  Bladder or urinary tract infection.  Constipation.  Blood in the urine (hematuria).  Injury to the bladder or urethra.  Psychological (psychogenic) conditions. Someone may hold his urine due to trauma or because he does not want to use the bathroom. What increases the risk? This condition is more likely to develop in older men. As men age, their prostate may become larger and may start pressing or squeezing on the bladder or the urethra. What are the signs or symptoms? Symptoms of this condition include:  Trouble urinating.  Pain in the lower abdomen. Symptoms usually come on slowly over a long period of time. How is this diagnosed? This condition is diagnosed based on a physical exam and a medical history. You may also have other tests, including:  An ultrasound of the bladder or kidneys or both.  Blood tests.  A urine analysis.  Additional tests may be needed such as an MRI, kidney, or bladder function tests. How is this treated? Treatment for this condition may include:  Medicines.  Placing a thin, sterile tube (catheter) into the bladder to drain urine out of the body. This is called an  indwelling urinary catheter. After being inserted, the catheter is held in place with a small balloon that is filled with sterile water. Urine drains from the catheter into a collection bag outside of the body.  Behavioral therapy.  Treatment for any underlying conditions.  If needed, you may be treated in the hospital for kidney function problems or to manage other complications. Follow these instructions at home:  Take over-the-counter and prescription medicines only as told by your health care provider. Avoid certain medicines, such as decongestants, antihistamines, and some prescription medicines. Do not take any medicine unless your health care provider has approved.  If you were given an indwelling urinary catheter, take care of it as told by your health care provider.  Drink enough fluid to keep your urine clear or pale yellow.  If you were prescribed an antibiotic, take it as told by your health care provider. Do not stop taking the antibiotic even if you start to feel better.  Do not use any products that contain nicotine or tobacco, such as cigarettes and e-cigarettes. If you need help quitting, ask your health care provider.  Monitor any changes in your symptoms. Tell your health care provider about any changes.  If instructed, monitor your blood pressure at home. Report changes as told by your health care provider.  Keep all follow-up visits as told by your health care provider. This is important. Contact a health care provider if:  You have uncomfortable bladder contractions that you cannot control (spasms) or you leak urine with the spasms.   Get help right away if:  You have chills or fever.  You have blood in your urine.  You have a catheter and: ? Your catheter stops draining urine. ? Your catheter falls out. Summary  Acute urinary retention is a condition in which a person is unable to pass urine. If left untreated, it can result in kidney damage or other serious  complications.  The cause of this condition may include an enlarged prostate. As men age, their prostate gland may become larger and may start pressing or squeezing on the bladder or the urethra.  Treatment for this condition may include medicines and placement of an indwelling urinary catheter.  Monitor any changes in your symptoms. Tell your health care provider about any changes. This information is not intended to replace advice given to you by your health care provider. Make sure you discuss any questions you have with your health care provider. Document Revised: 12/28/2016 Document Reviewed: 02/17/2016 Elsevier Patient Education  2020 [employer]  

## 2019-12-20 NOTE — Discharge Summary (Signed)
Physician Discharge Summary  Steven Vargas NTZ:[id number] DOB: [birth year] DOA: 12/15/2019  PCP: Wendie Agreste, MD  Admit date: 12/15/2019 Discharge date: 12/20/2019  Admitted From: Home Discharge disposition: Home with home health   Code Status: Full Code  Diet Recommendation: Cardiac diet  Discharge Diagnosis:   Principal Problem:   Acute GI bleeding Active Problems:   Acute posthemorrhagic anemia   ARF (acute renal failure) ([place])  History of Present Illness / Brief narrative:  Steven Vargas is a 84 y.o. male who presented to the ED on 12/15/2019 with worsening epigastric pain, constipation, blood in stool and progressive weakness for last 3 weeks. 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 Over the last several weeks, patient had melanotic stool, epigastric pain particularly after meals, dysphagia to solids.  He feels food stuck and then induces vomiting as a result.  He noticed a small amount of red blood after inducing emesis 3 weeks ago.  Denies any unexplained weight loss. Not on blood thinners.  Takes ibuprofen infrequently.  Reports history of constipation and sometimes requires manual disimpaction.  He has noticed small onset bright red blood per rectum after disimpaction with stools. Of note he had EGD and colonoscopy in 2013 for hematochezia and history of gastric ulcer.  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  Labs on admission showed hemoglobin of 5.6, WBC count of 19.4, creatinine elevated 2.02 from baseline 1.12 Fecal occult blood test positive. CT abdomen pelvis showed  -Marked distention of urinary bladder and moderate bilateral hydronephrosis concerning for neurogenic bladder versus bladder outlet obstruction.  -features concerning for chronic cholecystitis.  He was given 2 units of  PRBC transfusion.  Started on PPI drip.  GI consulted Admitted to hospitalist service  Of note, a Foley catheter was inserted in the ED which relieved 2.5 L of yellow-colored urine. Next morning, 550 mL of bloody urine was noted in indwelling catheter.    Subjective:  Seen and examined this morning.  Pleasant elderly Caucasian male.  Not in distress.  Urine clearing up.  Hospital Course:  Acute anemia -Hemoglobin was 5.2 on admission, likely from GI bleeding as well as hematuria -Underwent a total of 3 units of PRBC transfusion.  Hemoglobin at 7.5 this morning.  No drop in last 2 days.  Hematuria clearing up.  Bowel movement without blood. Recent Labs    12/17/19 0040 12/17/19 0741 12/18/19 0218 12/19/19 0037 12/20/19 0243  HGB 6.2* 7.7* 7.5* 7.3* 7.5*   Acute GI bleeding -GI consult appreciated.   -EGD 11/18 showed acute gastritis and nonbleeding gastric ulcers. -Continue PPI.  Acute urinary retention Hematuria -Patient reports history of lower urinary tract symptoms for several years but no established diagnosis of BPH -CT scan of abdomen finding as above showing urinary bladder distention and bilateral hydronephrosis.  Foley catheter was inserted. -2.5 L of clear yellow urine was drained initially in the ED.  Later next morning, 550 mL of blood-tinged urine was drained as well.   -Urology consult appreciated..  According catheter was placed.  Patient continues to have hematuria but is improving.  Continue to monitor. -Currently on Flomax and finasteride. -Urology recommendation is to continue Foley catheter at discharge.  AKI -Baseline creatinine 1.1, presented with a creatinine of 2.02. -Likely related to anemia, diuretics, use of NSAIDs and post renal obstruction.   -Creatinine trend is overall improving.  Continue to  maintain oral hydration at home.  Diuretics remain on hold.  Avoid NSAIDs. Recent Labs    04/20/19 1126 10/21/19 1113 12/15/19 1949 12/16/19 0739  12/17/19 0040 12/18/19 0218 12/19/19 0037 12/20/19 0243  BUN 17 13 50* 46* 35* 19 12 11   CREATININE 1.05 1.10 2.02* 1.97* 1.79* 1.59* 1.49* 1.69*   Hypokalemia  -Potassium level normalized today. Recent Labs  Lab 12/16/19 0739 12/17/19 0040 12/18/19 0218 12/19/19 0037 12/20/19 0243  K 3.8 3.2* 3.0* 3.5 3.1*  MG  --  1.9  --   --  1.7  PHOS  --  3.8  --   --  3.5   Leukocytosis -Possible chronic cholecystitis seen in the CAT scan.  -Currently on empiric antibiotics.  Leukocytosis improving. -Completed 5 days of antibiotics today. Recent Labs  Lab 12/16/19 0739 12/17/19 0040 12/18/19 0218 12/19/19 0037 12/20/19 0243  WBC 18.4* 14.2* 11.6* 10.3 9.9   Essential hypertension  -Home meds include amlodipine 10 mg daily, lisinopril 10 mg daily and hydrochlorothiazide 12.5 mg daily.  -Currently he is on Flomax and his blood pressure is controlled.  All other medicines remain on hold. I would recommend patient to continue blood pressure monitoring at home and follow-up with primary care provider in 1 to 2 weeks for further med adjustment.  History of prediabetes  -A1c 5.9 on 9/22. -we will follow CBGs closely. Recent Labs  Lab 12/19/19 0548 12/19/19 1212 12/19/19 1847 12/20/19 0020 12/20/19 0711  GLUCAP 95 91 107* 104* 102*   Stable for discharge to home today with home health PT. At patient's request, I sent prescription to [facility] mail service pharmacy.  He may not get his medicines delivered right away.  So I sent scripts to his local pharmacy as well for 2-week supply. Discussed with patient's daughter on the phone.  Wound care:    Discharge Exam:   Vitals:   12/19/19 1650 12/19/19 2102 12/20/19 0016 12/20/19 0430  BP: (!) 130/115 (!) 174/88 131/73 139/74  Pulse: 98 89 88 80  Resp: 20 (!) 21 18 18   Temp: 98.4 F (36.9 C) 98.9 F (37.2 C) 99.1 F (37.3 C) 97.7 F (36.5 C)  TempSrc: Oral Oral Oral Oral  SpO2: 94% 94% 95% 97%  Weight:      Height:         Body mass index is 32.96 kg/m.  General exam: Not in distress Skin: No rashes, lesions or ulcers. HEENT: Atraumatic, normocephalic, no obvious bleeding Lungs: Clear to auscultation bilaterally CVS: Regular rate and rhythm, no murmur GI/Abd soft, nontender, nondistended, bowel sound present CNS: Alert, awake, oriented x3 Psychiatry: Mood appropriate Extremities: No pedal edema, no calf tenderness  Follow ups:   Discharge Instructions    Diet - low sodium heart healthy   Complete by: As directed    Increase activity slowly   Complete by: As directed       Follow-up Information    Call Festus Aloe, MD.   Specialty: Urology Contact information: [place] [address] [phone]        Wendie Agreste, MD Follow up.   Specialties: Family Medicine, Sports Medicine Contact information: [place] [address] [phone]        Wilford Corner, MD Follow up.   Specialty: Gastroenterology Contact information: [address] [place] [address] [phone]               Recommendations for Outpatient Follow-Up:   1. Follow-up with  PCP as an outpatient 2. Follow-up with urology as an outpatient  Discharge Instructions:  Follow with Primary MD Wendie Agreste, MD in 7 days   Get CBC/BMP checked in next visit within 1 week by PCP or SNF MD ( we routinely change or add medications that can affect your baseline labs and fluid status, therefore we recommend that you get the mentioned basic workup next visit with your PCP, your PCP may decide not to get them or add new tests based on their clinical decision)  On your next visit with your PCP, please Get Medicines reviewed and adjusted.  Please request your PCP  to go over all Hospital Tests and Procedure/Radiological results at the follow up, please get all Hospital records sent to your Prim MD by signing hospital release before you go  home.  Activity: As tolerated with Full fall precautions use walker/cane & assistance as needed  For Heart failure patients - Check your Weight same time everyday, if you gain over 2 pounds, or you develop in leg swelling, experience more shortness of breath or chest pain, call your Primary MD immediately. Follow Cardiac Low Salt Diet and 1.5 lit/day fluid restriction.  If you have smoked or chewed Tobacco in the last 2 yrs please stop smoking, stop any regular Alcohol  and or any Recreational drug use.  If you experience worsening of your admission symptoms, develop shortness of breath, life threatening emergency, suicidal or homicidal thoughts you must seek medical attention immediately by calling 911 or calling your MD immediately  if symptoms less severe.  You Must read complete instructions/literature along with all the possible adverse reactions/side effects for all the Medicines you take and that have been prescribed to you. Take any new Medicines after you have completely understood and accpet all the possible adverse reactions/side effects.   Do not drive, operate heavy machinery, perform activities at heights, swimming or participation in water activities or provide baby sitting services if your were admitted for syncope or siezures until you have seen by Primary MD or a Neurologist and advised to do so again.  Do not drive when taking Pain medications.  Do not take more than prescribed Pain, Sleep and Anxiety Medications  Wear Seat belts while driving.   Please note You were cared for by a hospitalist during your hospital stay. If you have any questions about your discharge medications or the care you received while you were in the hospital after you are discharged, you can call the unit and asked to speak with the hospitalist on call if the hospitalist that took care of you is not available. Once you are discharged, your primary care physician will handle any further medical issues.  Please note that NO REFILLS for any discharge medications will be authorized once you are discharged, as it is imperative that you return to your primary care physician (or establish a relationship with a primary care physician if you do not have one) for your aftercare needs so that they can reassess your need for medications and monitor your lab values.    Allergies as of 12/20/2019      Reactions   Pravastatin Swelling, Other (See Comments)   Reported jaw and lip swelling along with myalgias in the back with pravastatin.  Symptoms resolved with stopping.       Medication List    STOP taking these medications   amLODipine 10 MG tablet Commonly known as: NORVASC   ibuprofen 200 MG tablet Commonly known as:  ADVIL   lisinopril-hydrochlorothiazide 10-12.5 MG tablet Commonly known as: ZESTORETIC     TAKE these medications   finasteride 5 MG tablet Commonly known as: PROSCAR Take 1 tablet (5 mg total) by mouth daily for 14 days. Start taking on: December 21, 2019   pantoprazole 40 MG tablet Commonly known as: PROTONIX Take 1 tablet (40 mg total) by mouth 2 (two) times daily for 14 days.   tamsulosin 0.4 MG Caps capsule Commonly known as: FLOMAX Take 1 capsule (0.4 mg total) by mouth daily for 14 days. Start taking on: December 21, 2019       Time coordinating discharge: 35 minutes  The results of significant diagnostics from this hospitalization (including imaging, microbiology, ancillary and laboratory) are listed below for reference.    Procedures and Diagnostic Studies:   CT ABDOMEN PELVIS WO CONTRAST  Result Date: 12/15/2019 CLINICAL DATA:  Gastrointestinal hemorrhage, diffuse abdominal pain, emesis EXAM: CT ABDOMEN AND PELVIS WITHOUT CONTRAST TECHNIQUE: Multidetector CT imaging of the abdomen and pelvis was performed following the standard protocol without IV contrast. COMPARISON:  None. FINDINGS: Lower chest: The visualized lung bases are clear save for a few  insignificant pneumatoceles. Visualized heart and pericardium are unremarkable. Hepatobiliary: Multiple simple cysts noted within the a hepatic dome. Liver otherwise unremarkable the gallbladder is contracted without pericholecystic inflammatory change. Punctate calcification within the gallbladder fossa may relate to remote or chronic inflammation. The extrahepatic bile duct is within normal limits for age. No intrahepatic biliary ductal dilation. Pancreas: Unremarkable Spleen: Fall unremarkable Adrenals/Urinary Tract: Bilateral adrenal adenoma are identified measuring 20 mm on the left and 22 mm on the right. The kidneys are normal in size and position. No intrarenal or ureteral calculi. Exophytic simple cortical cysts arise from the left kidney. There is moderate bilateral hydronephrosis and marked hydroureter. There is massive distention of the bladder with an estimated bladder volume of approximately 1800 cc. Stomach/Bowel: Severe sigmoid diverticulosis. Scattered diverticular seen throughout the remainder of the colon. The stomach, small bowel, and large bowel are otherwise unremarkable. Appendix normal. No free intraperitoneal gas or fluid. Vascular/Lymphatic: Moderate aortoiliac atherosclerotic calcification. No aneurysm. No pathologic adenopathy within the abdomen and pelvis Reproductive: Moderate prostatic enlargement. Seminal vesicles are unremarkable. Other: Small fat containing bilateral inguinal hernias are present. Rectum unremarkable. Musculoskeletal: No acute bone abnormality. Bilateral L5 pars defects are present with grade 1 anterolisthesis of L5 upon S1. Degenerative changes are seen throughout the lumbar spine. IMPRESSION: Marked distension of the bladder and moderate bilateral hydronephrosis in keeping with changes of neurogenic bladder or bladder outlet obstruction, possibly related to prostatic hypertrophy. The estimated bladder volume is approximately 1800 cc. Contracted gallbladder  demonstrating punctate foci of calcification within the gallbladder fossa possibly reflecting changes of chronic cholecystitis. This could be further assessed with dedicated sonography or hepatic biliary scintigraphy once the patient's acute issues have resolved. Bilateral adrenal adenoma. Aortic Atherosclerosis (ICD10-I70.0). Electronically Signed   By: Fidela Salisbury MD   On: 12/15/2019 22:27   DG Chest 2 View  Result Date: 12/15/2019 CLINICAL DATA:  GI bleeding, concern for malignancy, evaluate pulmonary nodules EXAM: CHEST - 2 VIEW COMPARISON:  Insert agree CT abdomen pelvis 12/15/2019, radiograph 04/04/2011 FINDINGS: Low lung volumes. Vascular crowding with ectatic changes. No discernible pulmonary nodules or masses. Mild vascular congestion without frank edema. No consolidative opacity. Prominent cardiac silhouette though possibly accentuated by technique. The aorta is calcified. The remaining cardiomediastinal contours are unremarkable. No acute osseous or soft tissue abnormality. Degenerative changes are present in  the imaged spine and shoulders. Telemetry leads overlie the chest. IMPRESSION: Low lung volumes, atelectasis and vascular congestion without frank edema. No discerning pulmonary nodules or masses though evaluation limited on portable radiographic technique. These results and limitations were discussed by telephone at the time of interpretation on 12/15/2019 at 10:32 pm to provider [facility] , who verbally acknowledged these results. Electronically Signed   By: Lovena Le M.D.   On: 12/15/2019 22:32     Labs:   Basic Metabolic Panel: Recent Labs  Lab 12/16/19 0739 12/16/19 0739 12/17/19 0040 12/17/19 0040 12/18/19 0218 12/18/19 0218 12/19/19 0037 12/20/19 0243  NA 144  --  140  --  141  --  140 140  K 3.8   < > 3.2*   < > 3.0*   < > 3.5 3.1*  CL 110  --  107  --  107  --  104 104  CO2 25  --  26  --  28  --  28 27  GLUCOSE 131*  --  107*  --  98  --  102* 146*  BUN  46*  --  35*  --  19  --  12 11  CREATININE 1.97*  --  1.79*  --  1.59*  --  1.49* 1.69*  CALCIUM 7.8*  --  7.2*  --  7.1*  --  7.1* 7.2*  MG  --   --  1.9  --   --   --   --  1.7  PHOS  --   --  3.8  --   --   --   --  3.5   < > = values in this interval not displayed.   GFR Estimated Creatinine Clearance: 33.6 mL/min (A) (by C-G formula based on SCr of 1.69 mg/dL (H)). Liver Function Tests: Recent Labs  Lab 12/15/19 1949  AST 15  ALT 26  ALKPHOS 37*  BILITOT 0.4  PROT 5.1*  ALBUMIN 2.8*   Recent Labs  Lab 12/15/19 1949 12/16/19 0739  LIPASE 55* 52*   No results for input(s): AMMONIA in the last 168 hours. Coagulation profile Recent Labs  Lab 12/15/19 2136  INR 1.1    CBC: Recent Labs  Lab 12/16/19 0739 12/16/19 0739 12/17/19 0040 12/17/19 0741 12/18/19 0218 12/19/19 0037 12/20/19 0243  WBC 18.4*  --  14.2*  --  11.6* 10.3 9.9  NEUTROABS 14.8*  --  10.7*  --  8.1* 6.9 7.0  HGB 8.0*   < > 6.2* 7.7* 7.5* 7.3* 7.5*  HCT 25.0*   < > 19.2* 23.2* 22.9* 22.6* 22.7*  MCV 93.3  --  94.1  --  91.6 93.0 93.0  PLT 190  --  200  --  224 235 255   < > = values in this interval not displayed.   Cardiac Enzymes: No results for input(s): CKTOTAL, CKMB, CKMBINDEX, TROPONINI in the last 168 hours. BNP: Invalid input(s): POCBNP CBG: Recent Labs  Lab 12/19/19 0548 12/19/19 1212 12/19/19 1847 12/20/19 0020 12/20/19 0711  GLUCAP 95 91 107* 104* 102*   D-Dimer No results for input(s): DDIMER in the last 72 hours. Hgb A1c No results for input(s): HGBA1C in the last 72 hours. Lipid Profile No results for input(s): CHOL, HDL, LDLCALC, TRIG, CHOLHDL, LDLDIRECT in the last 72 hours. Thyroid function studies No results for input(s): TSH, T4TOTAL, T3FREE, THYROIDAB in the last 72 hours.  Invalid input(s): FREET3 Anemia work up No results for input(s): VITAMINB12, FOLATE, FERRITIN,  TIBC, IRON, RETICCTPCT in the last 72 hours. Microbiology Recent Results (from the past  240 hour(s))  Respiratory Panel by RT PCR (Flu A&B, Covid) - Nasopharyngeal Swab     Status: None   Collection Time: 12/15/19 10:17 PM   Specimen: Nasopharyngeal Swab  Result Value Ref Range Status   SARS Coronavirus 2 by RT PCR NEGATIVE NEGATIVE Final    Comment: (NOTE) SARS-CoV-2 target nucleic acids are NOT DETECTED.  The SARS-CoV-2 RNA is generally detectable in upper respiratoy specimens during the acute phase of infection. The lowest concentration of SARS-CoV-2 viral copies this assay can detect is 131 copies/mL. A negative result does not preclude SARS-Cov-2 infection and should not be used as the sole basis for treatment or other patient management decisions. A negative result may occur with  improper specimen collection/handling, submission of specimen other than nasopharyngeal swab, presence of viral mutation(s) within the areas targeted by this assay, and inadequate number of viral copies (<131 copies/mL). A negative result must be combined with clinical observations, patient history, and epidemiological information. The expected result is Negative.  Fact Sheet for Patients:  PinkCheek.be  Fact Sheet for Healthcare Providers:  GravelBags.it  This test is no t yet approved or cleared by the [place] FDA and  has been authorized for detection and/or diagnosis of SARS-CoV-2 by FDA under an Emergency Use Authorization (EUA). This EUA will remain  in effect (meaning this test can be used) for the duration of the COVID-19 declaration under Section 564(b)(1) of the Act, 21 U.S.C. section 360bbb-3(b)(1), unless the authorization is terminated or revoked sooner.     Influenza A by PCR NEGATIVE NEGATIVE Final   Influenza B by PCR NEGATIVE NEGATIVE Final    Comment: (NOTE) The Xpert Xpress SARS-CoV-2/FLU/RSV assay is intended as an aid in  the diagnosis of influenza from Nasopharyngeal swab specimens and  should  not be used as a sole basis for treatment. Nasal washings and  aspirates are unacceptable for Xpert Xpress SARS-CoV-2/FLU/RSV  testing.  Fact Sheet for Patients: PinkCheek.be  Fact Sheet for Healthcare Providers: GravelBags.it  This test is not yet approved or cleared by the [place] FDA and  has been authorized for detection and/or diagnosis of SARS-CoV-2 by  FDA under an Emergency Use Authorization (EUA). This EUA will remain  in effect (meaning this test can be used) for the duration of the  Covid-19 declaration under Section 564(b)(1) of the Act, 21  U.S.C. section 360bbb-3(b)(1), unless the authorization is  terminated or revoked. Performed at [facility], [place] [address], [place], [place] [address]      Signed: Terrilee Croak  [facility] 12/20/2019, 10:53 AM

## 2019-12-21 ENCOUNTER — Telehealth: Payer: Self-pay | Admitting: *Deleted

## 2019-12-21 NOTE — Telephone Encounter (Signed)
Schedule patient for a hospital follow was discharged on 12/20/2019

## 2019-12-30 DIAGNOSIS — R338 Other retention of urine: Secondary | ICD-10-CM | POA: Diagnosis not present

## 2019-12-31 ENCOUNTER — Inpatient Hospital Stay: Payer: Medicare Other | Admitting: Emergency Medicine

## 2020-01-06 ENCOUNTER — Inpatient Hospital Stay: Payer: Medicare Other | Admitting: Family Medicine

## 2020-01-06 DIAGNOSIS — R338 Other retention of urine: Secondary | ICD-10-CM | POA: Diagnosis not present

## 2020-01-06 DIAGNOSIS — N130 Hydronephrosis with ureteropelvic junction obstruction: Secondary | ICD-10-CM | POA: Diagnosis not present

## 2020-01-20 ENCOUNTER — Ambulatory Visit: Payer: Medicare Other | Admitting: Family Medicine

## 2020-01-27 ENCOUNTER — Ambulatory Visit: Payer: Medicare Other | Admitting: Family Medicine

## 2020-02-08 DIAGNOSIS — K254 Chronic or unspecified gastric ulcer with hemorrhage: Secondary | ICD-10-CM | POA: Diagnosis not present

## 2020-02-09 DIAGNOSIS — R338 Other retention of urine: Secondary | ICD-10-CM | POA: Diagnosis not present

## 2020-02-15 ENCOUNTER — Other Ambulatory Visit: Payer: Self-pay

## 2020-02-15 ENCOUNTER — Encounter: Payer: Self-pay | Admitting: Family Medicine

## 2020-02-15 ENCOUNTER — Telehealth (INDEPENDENT_AMBULATORY_CARE_PROVIDER_SITE_OTHER): Payer: Medicare Other | Admitting: Family Medicine

## 2020-02-15 VITALS — Ht 64.0 in | Wt 182.0 lb

## 2020-02-15 DIAGNOSIS — D62 Acute posthemorrhagic anemia: Secondary | ICD-10-CM

## 2020-02-15 DIAGNOSIS — N179 Acute kidney failure, unspecified: Secondary | ICD-10-CM

## 2020-02-15 DIAGNOSIS — R339 Retention of urine, unspecified: Secondary | ICD-10-CM

## 2020-02-15 DIAGNOSIS — K922 Gastrointestinal hemorrhage, unspecified: Secondary | ICD-10-CM | POA: Diagnosis not present

## 2020-02-15 DIAGNOSIS — I10 Essential (primary) hypertension: Secondary | ICD-10-CM

## 2020-02-15 NOTE — Patient Instructions (Addendum)
Restart amlodipine 1/2 pill per day. Lab visit next week - we will call to schedule that appointment.  I will see you in person in early February.  We will also call you to schedule that appointment.  Keep follow-up with other specialist but let me know if there are any questions.  Good talking to you today.   If you have lab work done today you will be contacted with your lab results within the next 2 weeks.  If you have not heard from [place] then please contact us. The fastest way to get your results is to register for My Chart.   IF you received an x-ray today, you will receive an invoice from [facility]. Please contact [facility] at [phone] with questions or concerns regarding your invoice.   IF you received labwork today, you will receive an invoice from [facility]. Please contact [facility] at [phone] with questions or concerns regarding your invoice.   Our billing staff will not be able to assist you with questions regarding bills from these companies.  You will be contacted with the lab results as soon as they are available. The fastest way to get your results is to activate your My Chart account. Instructions are located on the last page of this paperwork. If you have not heard from [place] regarding the results in 2 weeks, please contact this office.

## 2020-02-15 NOTE — Progress Notes (Signed)
Virtual Visit via Telephone Note  4:13 PM - unable to reach.   I connected with Steven Vargas on 02/15/20 at 4:20 PM  35min chart review.  by telephone and verified that I am speaking with the correct person using two identifiers. Patient location:home My location: home   I discussed the limitations, risks, security and privacy concerns of performing an evaluation and management service by telephone and the availability of in person appointments. I also discussed with the patient that there may be a patient responsible charge related to this service. The patient expressed understanding and agreed to proceed, consent obtained  Chief complaint:  Chief Complaint  Patient presents with  . Hospitalization Follow-up    Pt was in the hospital on 12/15/2019. Pt went to the ER due to pain, internal bleeding, and random urination.. pt has chest ulcers and a kidney issue. Pt reports the hospital took him off his BP medication and would like to know if he can go back on that medication. Pt has an appt with urology coming up. Pt also has and appt to check on the chest ulcers.      History of Present Illness: Steven Vargas is a 85 y.o. male  GI bleed: Admitted in November 16 through November 21.  Acute GI bleed with posthemorrhagic anemia and acute renal failure.  Presented initially with epigastric pain, constipation, blood in stool and progressive weakness.  Hemoglobin 5.6, WBC 19.4, creatinine 2.02 up from his baseline of 1.12.  FOBT positive.  3 total units PRBC, PPI drip, GI consult.  EGD indicated acute gastritis and nonbleeding gastric ulcers on November 18, continued on PPI - Protonix 40mg  QD.  Still taking protonix QD. No hematochezia, no melena. Saw gastroenterology last week. Planned repeat testing next month, EGD.   Acute renal failure with acute urinary retention, hematuria. Creatinine 2.02 in November on hospital admission, CT abdomen pelvis showed marked distention of urinary  bladder, moderate bilateral hydronephrosis concerning for neurogenic bladder versus bladder outlet obstruction.  Foley catheter placed, continued on hospital discharge with urology follow-up. AKI thought to be related to anemia, diuretics, NSAIDs and postrenal obstruction.  Creatinine trend improved in hospital, oral hydration recommended at home.  Diuretics were held.  Avoid NSAIDs. Blood pressure was controlled at hospital discharge on Flomax alone.  Amlodipine lisinopril and HCTZ were held.  1 to 2-week follow-up recommended with PCP.   Has followed up with urology posthospitalization, most recently note reviewed from December 8th.  On tamsulosin for enlarged prostate.  Did not tolerate voiding trials, abdomen was distended at last visit with large PVR.  Plan on continuing Foley catheter with monthly changes. Still having to use catheter, had it changed at urology few days ago. Plan to continue catheter for awhile. Ongoing follow up with urology - Dr. Junious Silk.  Recent Home BP readings: 141/72, 148/82, 141/70, 152/80, 143/85, 147/76. Higher reading at [place]. 190? Full urine bag at that time.   Lab Results  Component Value Date   CREATININE 1.69 (H) 12/20/2019    Leukocytosis WBC 19.4 in November.  CT indicated possible chronic cholecystitis.  Was on empiric antibiotics with improvement in leukocytosis and completed 5 days of antibiotics.  WBC trended to normal during hospitalization. No fever. Feels ok otherwise.   Lab Results  Component Value Date   WBC 9.9 12/20/2019   HGB 7.5 (L) 12/20/2019   HCT 22.7 (L) 12/20/2019   MCV 93.0 12/20/2019   PLT 255 12/20/2019  Patient Active Problem List   Diagnosis Date Noted  . ARF (acute renal failure) ([place]) 12/16/2019  . Diverticulosis of colon (without mention of hemorrhage) 04/06/2011  . Benign neoplasm of colon 04/06/2011  . Diverticulosis of colon with hemorrhage 04/06/2011  . Diaphragmatic hernia without mention of  obstruction or gangrene 04/06/2011  . Hyperglycemia 04/05/2011  . Syncope and collapse 04/05/2011  . Ankle fracture, lateral malleolus, closed 04/05/2011  . Acute posthemorrhagic anemia 04/05/2011  . Blood in stool 04/05/2011  . Acute GI bleeding 04/05/2011   Past Medical History:  Diagnosis Date  . Gastric ulcer    secondary to nsaid 10 years ago  . GI bleed 04/04/2011  . Hip pain, chronic    right  . HLD (hyperlipidemia)   . Hypertension   . Obesity (BMI 35.0-39.9 without comorbidity) 03/2011  . Stroke [facility] [place])    2005   Past Surgical History:  Procedure Laterality Date  . BIOPSY  12/17/2019   Procedure: BIOPSY;  Surgeon: Wilford Corner, MD;  Location: [facility] ENDOSCOPY;  Service: Endoscopy;;  . ESOPHAGOGASTRODUODENOSCOPY [name] 12/17/2019   Procedure: ESOPHAGOGASTRODUODENOSCOPY (EGD);  Surgeon: Wilford Corner, MD;  Location: [place];  Service: Endoscopy;  Laterality: [name];  . TONSILLECTOMY     Allergies  Allergen Reactions  . Pravastatin Swelling and Other (See Comments)    Reported jaw and lip swelling along with myalgias in the back with pravastatin.  Symptoms resolved with stopping.    Prior to Admission medications   Medication Sig Start Date End Date Taking? Authorizing Provider  doxycycline (VIBRA-TABS) 100 MG tablet Take 100 mg by mouth 2 (two) times daily. Patient not taking: Reported on 02/15/2020 01/09/20   [provider]  pantoprazole (PROTONIX) 40 MG tablet Take 1 tablet (40 mg total) by mouth 2 (two) times daily for 14 days. 12/20/19 01/03/20  Terrilee Croak, MD   Social History   Socioeconomic History  . Marital status: Married    Spouse name: Not on file  . Number of children: Not on file  . Years of education: Not on file  . Highest education level: Not on file  Occupational History  . Not on file  Tobacco Use  . Smoking status: Former Research scientist (life sciences)  . Smokeless tobacco: Never Used  Substance and Sexual Activity  . Alcohol use: Yes    Comment:  occasional  . Drug use: No  . Sexual activity: Not Currently  Other Topics Concern  . Not on file  Social History Narrative   Lives in [place], [place] with his wife and son. 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 Drinks 2-3 beers once every few weeks. Smoking 2 years ago. Used to smoke 2-3 packs per day before that for past many years. Never did illicit drugs. Has United health care Medicare.   Social Determinants of Health   Financial Resource Strain: Not on file  Food Insecurity: Not on file  Transportation Needs: Not on file  Physical Activity: Not on file  Stress: Not on file  Social Connections: Not on file  Intimate Partner Violence: Not on file     Observations/Objective: Vitals:   02/15/20 1213  Weight: 182 lb (82.6 kg)  Height: 5\' 4"  (1.626 m)  Normal speech, no distress, appropriate responses during visit.  All questions were answered and understanding of plan expressed.   Assessment and Plan: Essential hypertension  -Initially controlled off his usual meds in the hospital, now having some increase in readings.  Reportedly higher at gastroenterology than  home readings.  We will have him restart amlodipine at 5 mg initially, nurse visit next week for labs, then in office with me in few weeks.  AKI (acute kidney injury) ([place]) Urinary retention  -BMP ordered for nurse/lab visit next week.  Continue follow-up with urology, Foley catheter in place for urinary retention.  Creatinine should be improving if primary cause of postobstructive nephropathy.  Gastrointestinal hemorrhage, unspecified gastrointestinal hemorrhage type Acute posthemorrhagic anemia  -Symptomatically stable, tolerating PPI, has followed up with gastroenterology with planned repeat endoscopy next month.  ER precautions given.   Follow Up Instructions: Lab visit next week, in person with me first part of February.   I discussed the assessment and treatment plan with the patient.  The patient was provided an opportunity to ask questions and all were answered. The patient agreed with the plan and demonstrated an understanding of the instructions.   The patient was advised to call back or seek an in-person evaluation if the symptoms worsen or if the condition fails to improve as anticipated.  I provided 33 minutes of non-face-to-face time during this encounter.  Signed,   [name], MD [facility] [place].  02/15/20

## 2020-02-24 DIAGNOSIS — R338 Other retention of urine: Secondary | ICD-10-CM | POA: Diagnosis not present

## 2020-03-03 ENCOUNTER — Other Ambulatory Visit: Payer: Self-pay

## 2020-03-03 ENCOUNTER — Ambulatory Visit: Payer: Medicare Other

## 2020-03-03 DIAGNOSIS — N179 Acute kidney failure, unspecified: Secondary | ICD-10-CM

## 2020-03-03 DIAGNOSIS — D62 Acute posthemorrhagic anemia: Secondary | ICD-10-CM

## 2020-03-03 LAB — BASIC METABOLIC PANEL
BUN/Creatinine Ratio: 15 (ref 10–24)
BUN: 16 mg/dL (ref 8–27)
CO2: 24 mmol/L (ref 20–29)
Calcium: 9.2 mg/dL (ref 8.6–10.2)
Chloride: 102 mmol/L (ref 96–106)
Creatinine, Ser: 1.08 mg/dL (ref 0.76–1.27)
GFR calc Af Amer: 74 mL/min/{1.73_m2} (ref >59)
GFR calc non Af Amer: 64 mL/min/{1.73_m2} (ref >59)
Glucose: 101 mg/dL — ABNORMAL HIGH (ref 65–99)
Potassium: 3.9 mmol/L (ref 3.5–5.2)
Sodium: 143 mmol/L (ref 134–144)

## 2020-03-03 LAB — CBC
Hematocrit: 38.3 % (ref 37.5–51.0)
Hemoglobin: 11.6 g/dL — ABNORMAL LOW (ref 13.0–17.7)
MCH: 25.4 pg — ABNORMAL LOW (ref 26.6–33.0)
MCHC: 30.3 g/dL — ABNORMAL LOW (ref 31.5–35.7)
MCV: 84 fL (ref 79–97)
Platelets: 307 10*3/uL (ref 150–450)
RBC: 4.56 x10E6/uL (ref 4.14–5.80)
RDW: 14.9 % (ref 11.6–15.4)
WBC: 9 10*3/uL (ref 3.4–10.8)

## 2020-03-09 DIAGNOSIS — R338 Other retention of urine: Secondary | ICD-10-CM | POA: Diagnosis not present

## 2020-03-09 DIAGNOSIS — R3912 Poor urinary stream: Secondary | ICD-10-CM | POA: Diagnosis not present

## 2020-03-17 DIAGNOSIS — Z01812 Encounter for preprocedural laboratory examination: Secondary | ICD-10-CM | POA: Diagnosis not present

## 2020-03-22 DIAGNOSIS — K289 Gastrojejunal ulcer, unspecified as acute or chronic, without hemorrhage or perforation: Secondary | ICD-10-CM | POA: Diagnosis not present

## 2020-03-22 DIAGNOSIS — K449 Diaphragmatic hernia without obstruction or gangrene: Secondary | ICD-10-CM | POA: Diagnosis not present

## 2020-03-22 DIAGNOSIS — K2970 Gastritis, unspecified, without bleeding: Secondary | ICD-10-CM | POA: Diagnosis not present

## 2020-03-22 DIAGNOSIS — K319 Disease of stomach and duodenum, unspecified: Secondary | ICD-10-CM | POA: Diagnosis not present

## 2020-03-22 DIAGNOSIS — K259 Gastric ulcer, unspecified as acute or chronic, without hemorrhage or perforation: Secondary | ICD-10-CM | POA: Diagnosis not present

## 2020-03-30 DIAGNOSIS — K319 Disease of stomach and duodenum, unspecified: Secondary | ICD-10-CM | POA: Diagnosis not present

## 2020-04-11 DIAGNOSIS — R338 Other retention of urine: Secondary | ICD-10-CM | POA: Diagnosis not present

## 2020-04-11 DIAGNOSIS — N130 Hydronephrosis with ureteropelvic junction obstruction: Secondary | ICD-10-CM | POA: Diagnosis not present

## 2020-05-10 DIAGNOSIS — R338 Other retention of urine: Secondary | ICD-10-CM | POA: Diagnosis not present

## 2020-05-10 DIAGNOSIS — N130 Hydronephrosis with ureteropelvic junction obstruction: Secondary | ICD-10-CM | POA: Diagnosis not present

## 2020-05-18 DIAGNOSIS — N130 Hydronephrosis with ureteropelvic junction obstruction: Secondary | ICD-10-CM | POA: Diagnosis not present

## 2020-05-18 DIAGNOSIS — R338 Other retention of urine: Secondary | ICD-10-CM | POA: Diagnosis not present

## 2020-06-30 ENCOUNTER — Telehealth: Payer: Self-pay | Admitting: Family Medicine

## 2020-06-30 NOTE — Telephone Encounter (Signed)
Okay with transfer of care.  Thanks.

## 2020-06-30 NOTE — Telephone Encounter (Signed)
Patient is requesting TOC from Dr. Nyoka Cowden to Dr. Mitchel Honour. States the distance for the new office is too far for him and his wife.

## 2020-09-05 ENCOUNTER — Ambulatory Visit (INDEPENDENT_AMBULATORY_CARE_PROVIDER_SITE_OTHER): Payer: Medicare Other | Admitting: Emergency Medicine

## 2020-09-05 ENCOUNTER — Other Ambulatory Visit: Payer: Self-pay

## 2020-09-05 ENCOUNTER — Encounter: Payer: Self-pay | Admitting: Emergency Medicine

## 2020-09-05 VITALS — BP 138/80 | HR 88 | Temp 98.4°F | Ht 64.0 in | Wt 207.0 lb

## 2020-09-05 DIAGNOSIS — I10 Essential (primary) hypertension: Secondary | ICD-10-CM | POA: Diagnosis not present

## 2020-09-05 DIAGNOSIS — N401 Enlarged prostate with lower urinary tract symptoms: Secondary | ICD-10-CM | POA: Diagnosis not present

## 2020-09-05 DIAGNOSIS — Z8711 Personal history of peptic ulcer disease: Secondary | ICD-10-CM

## 2020-09-05 DIAGNOSIS — Z7689 Persons encountering health services in other specified circumstances: Secondary | ICD-10-CM | POA: Diagnosis not present

## 2020-09-05 DIAGNOSIS — K5730 Diverticulosis of large intestine without perforation or abscess without bleeding: Secondary | ICD-10-CM | POA: Diagnosis not present

## 2020-09-05 DIAGNOSIS — Z8719 Personal history of other diseases of the digestive system: Secondary | ICD-10-CM | POA: Diagnosis not present

## 2020-09-05 LAB — COMPREHENSIVE METABOLIC PANEL
ALT: 10 U/L (ref 0–53)
AST: 13 U/L (ref 0–37)
Albumin: 4 g/dL (ref 3.5–5.2)
Alkaline Phosphatase: 70 U/L (ref 39–117)
BUN: 18 mg/dL (ref 6–23)
CO2: 29 mEq/L (ref 19–32)
Calcium: 8.9 mg/dL (ref 8.4–10.5)
Chloride: 102 mEq/L (ref 96–112)
Creatinine, Ser: 1.05 mg/dL (ref 0.40–1.50)
GFR: 65.82 mL/min (ref >60.00)
Glucose, Bld: 95 mg/dL (ref 70–99)
Potassium: 3.6 mEq/L (ref 3.5–5.1)
Sodium: 139 mEq/L (ref 135–145)
Total Bilirubin: 0.4 mg/dL (ref 0.2–1.2)
Total Protein: 7.3 g/dL (ref 6.0–8.3)

## 2020-09-05 LAB — CBC WITH DIFFERENTIAL/PLATELET
Basophils Absolute: 0 10*3/uL (ref 0.0–0.1)
Basophils Relative: 0.4 % (ref 0.0–3.0)
Eosinophils Absolute: 0.1 10*3/uL (ref 0.0–0.7)
Eosinophils Relative: 1.2 % (ref 0.0–5.0)
HCT: 42.1 % (ref 39.0–52.0)
Hemoglobin: 13.2 g/dL (ref 13.0–17.0)
Lymphocytes Relative: 14.5 % (ref 12.0–46.0)
Lymphs Abs: 1.2 10*3/uL (ref 0.7–4.0)
MCHC: 31.3 g/dL (ref 30.0–36.0)
MCV: 78.7 fl (ref 78.0–100.0)
Monocytes Absolute: 0.9 10*3/uL (ref 0.1–1.0)
Monocytes Relative: 10 % (ref 3.0–12.0)
Neutro Abs: 6.3 10*3/uL (ref 1.4–7.7)
Neutrophils Relative %: 73.9 % (ref 43.0–77.0)
Platelets: 231 10*3/uL (ref 150.0–400.0)
RBC: 5.35 Mil/uL (ref 4.22–5.81)
RDW: 17.1 % — ABNORMAL HIGH (ref 11.5–15.5)
WBC: 8.6 10*3/uL (ref 4.0–10.5)

## 2020-09-05 LAB — LIPID PANEL
Cholesterol: 194 mg/dL (ref 0–200)
HDL: 69.3 mg/dL (ref >39.00)
LDL Cholesterol: 108 mg/dL — ABNORMAL HIGH (ref 0–99)
NonHDL: 124.63
Total CHOL/HDL Ratio: 3
Triglycerides: 84 mg/dL (ref 0.0–149.0)
VLDL: 16.8 mg/dL (ref 0.0–40.0)

## 2020-09-05 LAB — HEMOGLOBIN A1C: Hgb A1c MFr Bld: 6.4 % (ref 4.6–6.5)

## 2020-09-05 NOTE — Progress Notes (Signed)
Steven Vargas 85 y.o.   Chief Complaint  Patient presents with   [facility] follow up, medication management    HISTORY OF PRESENT ILLNESS: This is a 85 y.o. male former patient of Dr. Nyoka Vargas, here to establish care with me. Has the following chronic medical problems: #1 hypertension with normal blood pressure readings at home off medications #2 history of diverticulosis #3 history of gastric ulcer and gastritis: On pantoprazole #4 history of prostatic enlargement and urinary retention: On tamsulosin and finasteride Was hospitalized on 12/15/2019 due to upper GI bleed requiring blood transfusions.  Did well.  No complications.  HPI   Prior to Admission medications   Medication Sig Start Date End Date Taking? Authorizing Provider  finasteride (PROSCAR) 5 MG tablet Take 5 mg by mouth daily.   Yes [provider]  pantoprazole (PROTONIX) 40 MG tablet Take 1 tablet (40 mg total) by mouth 2 (two) times daily for 14 days. 12/20/19 09/05/20 Yes Dahal, Marlowe Aschoff, MD  tamsulosin (FLOMAX) 0.4 MG CAPS capsule Take 0.4 mg by mouth.   Yes [provider]  doxycycline (VIBRA-TABS) 100 MG tablet Take 100 mg by mouth 2 (two) times daily. Patient not taking: No sig reported 01/09/20   [provider]    Allergies  Allergen Reactions   Pravastatin Swelling and Other (See Comments)    Reported jaw and lip swelling along with myalgias in the back with pravastatin.  Symptoms resolved with stopping.     Patient Active Problem List   Diagnosis Date Noted   Diverticulosis of colon (without mention of hemorrhage) 04/06/2011   Benign neoplasm of colon 04/06/2011   Diverticulosis of colon with hemorrhage 04/06/2011   Diaphragmatic hernia without mention of obstruction or gangrene 04/06/2011    Past Medical History:  Diagnosis Date   Gastric ulcer    secondary to nsaid 10 years ago   GI bleed 04/04/2011   Hip pain, chronic    right   HLD (hyperlipidemia)     Hypertension    Obesity (BMI 35.0-39.9 without comorbidity) 03/2011   Stroke ([place])    2005    Past Surgical History:  Procedure Laterality Date   BIOPSY  12/17/2019   Procedure: BIOPSY;  Surgeon: Wilford Corner, MD;  Location: [facility] ENDOSCOPY;  Service: Endoscopy;;   ESOPHAGOGASTRODUODENOSCOPY [name] 12/17/2019   Procedure: ESOPHAGOGASTRODUODENOSCOPY (EGD);  Surgeon: Wilford Corner, MD;  Location: [place];  Service: Endoscopy;  Laterality: [name];   TONSILLECTOMY      Social History   Socioeconomic History   Marital status: Married    Spouse name: Not on file   Number of children: Not on file   Years of education: Not on file   Highest education level: Not on file  Occupational History   Not on file  Tobacco Use   Smoking status: Former   Smokeless tobacco: Never  Substance and Sexual Activity   Alcohol use: Yes    Comment: occasional   Drug use: No   Sexual activity: Not Currently  Other Topics Concern   Not on file  Social History Narrative   Lives in [place], [place] with his wife and son. 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 Drinks 2-3 beers once every few weeks. Smoking 2 years ago. Used to smoke 2-3 packs per day before that for past many years. Never did illicit drugs. Has United health care Medicare.   Social Determinants of Health   Financial Resource Strain: Not on  file  Food Insecurity: Not on file  Transportation Needs: Not on file  Physical Activity: Not on file  Stress: Not on file  Social Connections: Not on file  Intimate Partner Violence: Not on file    Family History  Problem Relation Age of Onset   Stroke Mother    Kidney disease Brother    Diabetes Brother      Review of Systems  Constitutional: Negative.  Negative for chills and fever.  HENT: Negative.  Negative for congestion and sore throat.   Respiratory: Negative.  Negative for cough and shortness of breath.   Cardiovascular: Negative.  Negative for  chest pain and palpitations.  Gastrointestinal: Negative.  Negative for abdominal pain, diarrhea, nausea and vomiting.  Genitourinary: Negative.  Negative for dysuria and hematuria.  Skin: Negative.  Negative for rash.  Neurological: Negative.  Negative for dizziness and headaches.  All other systems reviewed and are negative.  Today's Vitals   09/05/20 1306 09/05/20 1336  BP: (!) 164/88 138/80  Pulse: 88   Temp: 98.4 F (36.9 C)   TempSrc: Oral   SpO2: 95%   Weight: 207 lb (93.9 kg)   Height: '5\' 4"'$  (1.626 m)    Body mass index is 35.53 kg/m.  Physical Exam Vitals reviewed.  Constitutional:      Appearance: Normal appearance. He is obese.  HENT:     Head: Normocephalic.  Cardiovascular:     Rate and Rhythm: Normal rate and regular rhythm.     Pulses: Normal pulses.     Heart sounds: Normal heart sounds.  Pulmonary:     Effort: Pulmonary effort is normal.     Breath sounds: Normal breath sounds.  Abdominal:     General: Bowel sounds are normal. There is no distension.     Palpations: Abdomen is soft.     Tenderness: There is no abdominal tenderness.  Musculoskeletal:        General: Normal range of motion.     Cervical back: Normal range of motion and neck supple.  Skin:    General: Skin is warm and dry.     Capillary Refill: Capillary refill takes less than 2 seconds.  Neurological:     General: No focal deficit present.     Mental Status: He is alert and oriented to person, place, and time.  Psychiatric:        Mood and Affect: Mood normal.        Behavior: Behavior normal.     ASSESSMENT & PLAN: Essential hypertension Well-controlled hypertension without medications. Normal blood pressure readings at home.  Diverticulosis of colon No recent diverticular bleed.  Stable.  Benign prostatic hyperplasia with lower urinary tract symptoms Well-controlled with tamsulosin and finasteride.  Steven Vargas was seen today for transitions of care.  Diagnoses and all  orders for this visit:  Benign prostatic hyperplasia with lower urinary tract symptoms, symptom details unspecified  Diverticulosis of colon -     CBC with Differential/Platelet  Encounter to establish care  History of gastric ulcer -     CBC with Differential/Platelet  History of GI bleed -     CBC with Differential/Platelet  Essential hypertension -     Comprehensive metabolic panel -     Hemoglobin A1c -     Lipid panel  Patient Instructions  Health Maintenance After Age 38 After age 74, you are at a higher risk for certain long-term diseases and infections as well as injuries from falls. Falls are a major  cause of broken bones and head injuries in people who are older than age 36. Getting regular preventive care can help to keep you healthy and well. Preventive care includes getting regular testing and making lifestyle changes as recommended by your health care provider. Talk with your health care provider about: Which screenings and tests you should have. A screening is a test that checks for a disease when you have no symptoms. A diet and exercise plan that is right for you. What should I know about screenings and tests to prevent falls? Screening and testing are the best ways to find a health problem early. Early diagnosis and treatment give you the best chance of managing medical conditions that are common after age 28. Certain conditions and lifestyle choices may make you more likely to have a fall. Your health care provider may recommend: Regular vision checks. Poor vision and conditions such as cataracts can make you more likely to have a fall. If you wear glasses, make sure to get your prescription updated if your vision changes. Medicine review. Work with your health care provider to regularly review all of the medicines you are taking, including over-the-counter medicines. Ask your health care provider about any side effects that may make you more likely to have a fall. Tell  your health care provider if any medicines that you take make you feel dizzy or sleepy. Osteoporosis screening. Osteoporosis is a condition that causes the bones to get weaker. This can make the bones weak and cause them to break more easily. Blood pressure screening. Blood pressure changes and medicines to control blood pressure can make you feel dizzy. Strength and balance checks. Your health care provider may recommend certain tests to check your strength and balance while standing, walking, or changing positions. Foot health exam. Foot pain and numbness, as well as not wearing proper footwear, can make you more likely to have a fall. Depression screening. You may be more likely to have a fall if you have a fear of falling, feel emotionally low, or feel unable to do activities that you used to do. Alcohol use screening. Using too much alcohol can affect your balance and may make you more likely to have a fall. What actions can I take to lower my risk of falls? General instructions Talk with your health care provider about your risks for falling. Tell your health care provider if: You fall. Be sure to tell your health care provider about all falls, even ones that seem minor. You feel dizzy, sleepy, or off-balance. Take over-the-counter and prescription medicines only as told by your health care provider. These include any supplements. Eat a healthy diet and maintain a healthy weight. A healthy diet includes low-fat dairy products, low-fat (lean) meats, and fiber from whole grains, beans, and lots of fruits and vegetables. Home safety Remove any tripping hazards, such as rugs, cords, and clutter. Install safety equipment such as grab bars in bathrooms and safety rails on stairs. Keep rooms and walkways well-lit. Activity  Follow a regular exercise program to stay fit. This will help you maintain your balance. Ask your health care provider what types of exercise are appropriate for you. If you  need a cane or walker, use it as recommended by your health care provider. Wear supportive shoes that have nonskid soles.  Lifestyle Do not drink alcohol if your health care provider tells you not to drink. If you drink alcohol, limit how much you have: 0-1 drink a day for women. 0-2  drinks a day for men. Be aware of how much alcohol is in your drink. In the [place] one drink equals one typical bottle of beer (12 oz), one-half glass of wine (5 oz), or one shot of hard liquor (1 oz). Do not use any products that contain nicotine or tobacco, such as cigarettes and e-cigarettes. If you need help quitting, ask your health care provider. Summary Having a healthy lifestyle and getting preventive care can help to protect your health and wellness after age 61. Screening and testing are the best way to find a health problem early and help you avoid having a fall. Early diagnosis and treatment give you the best chance for managing medical conditions that are more common for people who are older than age 87. Falls are a major cause of broken bones and head injuries in people who are older than age 53. Take precautions to prevent a fall at home. Work with your health care provider to learn what changes you can make to improve your health and wellness and to prevent falls. This information is not intended to replace advice given to you by your health care provider. Make sure you discuss any questions you have with your healthcare provider. Document Revised: 01/01/2020 Document Reviewed: 01/01/2020 Elsevier Patient Education  2022 [place], MD [place] [facility]

## 2020-09-05 NOTE — Assessment & Plan Note (Signed)
Well-controlled with tamsulosin and finasteride.

## 2020-09-05 NOTE — Patient Instructions (Signed)

## 2020-09-05 NOTE — Assessment & Plan Note (Signed)
No recent diverticular bleed.  Stable.

## 2020-09-05 NOTE — Assessment & Plan Note (Signed)
Well-controlled hypertension without medications. Normal blood pressure readings at home.

## 2020-11-14 ENCOUNTER — Ambulatory Visit (INDEPENDENT_AMBULATORY_CARE_PROVIDER_SITE_OTHER): Payer: Medicare Other

## 2020-11-14 DIAGNOSIS — Z0000 Encounter for general adult medical examination without abnormal findings: Secondary | ICD-10-CM | POA: Diagnosis not present

## 2020-11-14 NOTE — Progress Notes (Signed)
I connected with Steven Vargas today by telephone and verified that I am speaking with the correct person using two identifiers. Location patient: home Location provider: work Persons participating in the virtual visit: patient, provider.   I discussed the limitations, risks, security and privacy concerns of performing an evaluation and management service by telephone and the availability of in person appointments. I also discussed with the patient that there may be a patient responsible charge related to this service. The patient expressed understanding and verbally consented to this telephonic visit.    Interactive audio and video telecommunications were attempted between this provider and patient, however failed, due to patient having technical difficulties OR patient did not have access to video capability.  We continued and completed visit with audio only.  Some vital signs may be absent or patient reported.   Time Spent with patient on telephone encounter: 30 minutes  Subjective:   Steven Vargas is a 86 y.o. male who presents for Medicare Annual/Subsequent preventive examination.  Review of Systems     Cardiac Risk Factors include: advanced age (>75men, >70 women);family history of premature cardiovascular disease;hypertension;dyslipidemia;male gender;obesity (BMI >30kg/m2)     Objective:    There were no vitals filed for this visit. There is no height or weight on file to calculate BMI.  Advanced Directives 11/14/2020 12/17/2019 12/16/2019 12/15/2019 10/18/2016 04/12/2016 04/04/2011  Does Patient Have a Medical Advance Directive? No No - No No No Patient does not have advance directive  Would patient like information on creating a medical advance directive? No - Patient declined No - Patient declined No - Patient declined - No - Patient declined Yes (ED - Information included in AVS) -  Pre-existing out of facility DNR order (yellow form or pink MOST form) - - - - - - No    Current  Medications (verified) Outpatient Encounter Medications as of 11/14/2020  Medication Sig   finasteride (PROSCAR) 5 MG tablet Take 5 mg by mouth daily.   tamsulosin (FLOMAX) 0.4 MG CAPS capsule Take 0.4 mg by mouth.   pantoprazole (PROTONIX) 40 MG tablet Take 1 tablet (40 mg total) by mouth 2 (two) times daily for 14 days.   [DISCONTINUED] doxycycline (VIBRA-TABS) 100 MG tablet Take 100 mg by mouth 2 (two) times daily. (Patient not taking: No sig reported)   No facility-administered encounter medications on file as of 11/14/2020.    Allergies (verified) Pravastatin   History: Past Medical History:  Diagnosis Date   Gastric ulcer    secondary to nsaid 10 years ago   GI bleed 04/04/2011   Hip pain, chronic    right   HLD (hyperlipidemia)    Hypertension    Obesity (BMI 35.0-39.9 without comorbidity) 03/2011   Stroke [facility])    2005   Past Surgical History:  Procedure Laterality Date   BIOPSY  12/17/2019   Procedure: BIOPSY;  Surgeon: Wilford Corner, MD;  Location: [facility] ENDOSCOPY;  Service: Endoscopy;;   ESOPHAGOGASTRODUODENOSCOPY [name] 12/17/2019   Procedure: ESOPHAGOGASTRODUODENOSCOPY (EGD);  Surgeon: Wilford Corner, MD;  Location: [facility];  Service: Endoscopy;  Laterality: [name];   TONSILLECTOMY     Family History  Problem Relation Age of Onset   Stroke Mother    Kidney disease Brother    Diabetes Brother    Social History   Socioeconomic History   Marital status: Married    Spouse name: Not on file   Number of children: Not on file   Years of education: Not on file   Highest  education level: Not on file  Occupational History   Not on file  Tobacco Use   Smoking status: Former   Smokeless tobacco: Never  Substance and Sexual Activity   Alcohol use: Yes    Comment: occasional   Drug use: No   Sexual activity: Not Currently  Other Topics Concern   Not on file  Social History Narrative   Lives in [place], [place] with his wife and son. 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 Drinks 2-3 beers once every few weeks. Smoking 2 years ago. Used to smoke 2-3 packs per day before that for past many years. Never did illicit drugs. Has United health care Medicare.   Social Determinants of Health   Financial Resource Strain: Low Risk    Difficulty of Paying Living Expenses: Not hard at all  Food Insecurity: No Food Insecurity   Worried About Charity fundraiser in the Last Year: Never true   [place] in the Last Year: Never true  Transportation Needs: No Transportation Needs   Lack of Transportation (Medical): No   Lack of Transportation (Non-Medical): No  Physical Activity: Sufficiently Active   Days of Exercise per Week: 5 days   Minutes of Exercise per Session: 30 min  Stress: No Stress Concern Present   Feeling of Stress : Not at all  Social Connections: Socially Integrated   Frequency of Communication with Friends and Family: More than three times a week   Frequency of Social Gatherings with Friends and Family: More than three times a week   Attends Religious Services: More than 4 times per year   Active Member of [employer] or Organizations: Yes   Attends Music therapist: More than 4 times per year   Marital Status: Married    Tobacco Counseling Counseling given: Not Answered   Clinical Intake:  Pre-visit preparation completed: No  Pain : No/denies pain     Nutritional Risks: None Diabetes: No  How often do you need to have someone help you when you read instructions, pamphlets, or other written materials from your doctor or pharmacy?: 1 - Never What is the last grade level you completed in school?: 11th grade  Diabetic? no  Interpreter Needed?: No  Information entered by :: Lisette Abu, LPN   Activities of Daily Living In your present state of health, do you have any difficulty performing the following activities: 11/14/2020 12/16/2019  Hearing? N N  Vision? N N  Difficulty  concentrating or making decisions? N N  Walking or climbing stairs? N N  Dressing or bathing? N Y  Doing errands, shopping? N N  Preparing Food and eating ? N -  Using the Toilet? N -  In the past six months, have you accidently leaked urine? N -  Do you have problems with loss of bowel control? N -  Managing your Medications? N -  Managing your Finances? N -  Housekeeping or managing your Housekeeping? N -  Some recent data might be hidden    Patient Care Team: Horald Pollen, MD as PCP - General (Internal Medicine)  Indicate any recent Medical Services you may have received from other than [facility] providers in the past year (date may be approximate).     Assessment:   This is a routine wellness examination for Kayleb.  Hearing/Vision screen Hearing Screening - Comments:: Patient denied any hearing difficulty.   No hearing aids.  Vision Screening - Comments:: Patient does  not wear any corrective lenses.  Dietary issues and exercise activities discussed: Current Exercise Habits: Home exercise routine, Type of exercise: walking, Time (Minutes): 30, Frequency (Times/Week): 5, Weekly Exercise (Minutes/Week): 150, Intensity: Moderate, Exercise limited by: None identified   Goals Addressed   None   Depression Screen PHQ 2/9 Scores 11/14/2020 09/05/2020 10/21/2019 04/20/2019 10/20/2018 10/20/2018 04/17/2018  PHQ - 2 Score 0 0 0 0 0 0 0    Fall Risk Fall Risk  11/14/2020 09/05/2020 02/15/2020 10/21/2019 04/20/2019  Falls in the past year? 0 0 0 0 0  Number falls in past yr: 0 0 - - -  Injury with Fall? 0 0 - - -  Risk for fall due to : No Fall Risks - - - -  Follow up Falls evaluation completed - Falls evaluation completed Falls evaluation completed Falls evaluation completed    [place]:  Any stairs in or around the home? No  If so, are there any without handrails? No  Home free of loose throw rugs in walkways, pet beds, electrical cords, etc?  Yes  Adequate lighting in your home to reduce risk of falls? Yes   ASSISTIVE DEVICES UTILIZED TO PREVENT FALLS:  Life alert? No  Use of a cane, walker or w/c? No  Grab bars in the bathroom? Yes  Shower chair or bench in shower? Yes  Elevated toilet seat or a handicapped toilet? No   TIMED UP AND GO:  Was the test performed? No .  Length of time to ambulate 10 feet: n/a sec.   Gait steady and fast without use of assistive device  Cognitive Function: Normal cognitive status assessed by direct observation by this Nurse Health Advisor. No abnormalities found.          Immunizations Immunization History  Administered Date(s) Administered   Fluad Quad(high Dose 65+) 10/20/2018   Influenza, High Dose Seasonal PF 10/17/2017   Influenza,inj,Quad PF,6+ Mos 10/18/2016   PFIZER(Purple Top)SARS-COV-2 Vaccination 05/29/2019, 06/22/2019   Pneumococcal Conjugate-13 04/12/2016   Pneumococcal Polysaccharide-23 10/17/2017    TDAP status: Due, Education has been provided regarding the importance of this vaccine. Advised may receive this vaccine at local pharmacy or Health Dept. Aware to provide a copy of the vaccination record if obtained from local pharmacy or Health Dept. Verbalized acceptance and understanding.  Flu Vaccine status: Due, Education has been provided regarding the importance of this vaccine. Advised may receive this vaccine at local pharmacy or Health Dept. Aware to provide a copy of the vaccination record if obtained from local pharmacy or Health Dept. Verbalized acceptance and understanding.  Pneumococcal vaccine status: Up to date  Covid-19 vaccine status: Completed vaccines  Qualifies for Shingles Vaccine? Yes   Zostavax completed No   Shingrix Completed?: No.    Education has been provided regarding the importance of this vaccine. Patient has been advised to call insurance company to determine out of pocket expense if they have not yet received this vaccine. Advised may  also receive vaccine at local pharmacy or Health Dept. Verbalized acceptance and understanding.  Screening Tests Health Maintenance  Topic Date Due   TETANUS/TDAP  Never done   Zoster Vaccines- Shingrix (1 of 2) Never done   COVID-19 Vaccine (3 - Booster for Pfizer series) 11/22/2019   INFLUENZA VACCINE  08/29/2020   HPV VACCINES  Aged Out    Health Maintenance  Health Maintenance Due  Topic Date Due   TETANUS/TDAP  Never done   Zoster Vaccines-  Shingrix (1 of 2) Never done   COVID-19 Vaccine (3 - Booster for Pfizer series) 11/22/2019   INFLUENZA VACCINE  08/29/2020    Colorectal cancer screening: No longer required.   Lung Cancer Screening: (Low Dose CT Chest recommended if Age 64-80 years, 30 pack-year currently smoking OR have quit w/in 15years.) does not qualify.   Lung Cancer Screening Referral: no  Additional Screening:  Hepatitis C Screening: does not qualify; Completed no  Vision Screening: Recommended annual ophthalmology exams for early detection of glaucoma and other disorders of the eye. Is the patient up to date with their annual eye exam?  Yes  Who is the provider or what is the name of the office in which the patient attends annual eye exams? [facility] If pt is not established with a provider, would they like to be referred to a provider to establish care? No .   Dental Screening: Recommended annual dental exams for proper oral hygiene  Community Resource Referral / Chronic Care Management: CRR required this visit?  No   CCM required this visit?  No      Plan:     I have personally reviewed and noted the following in the patient's chart:   Medical and social history Use of alcohol, tobacco or illicit drugs  Current medications and supplements including opioid prescriptions. Patient is not currently taking opioid prescriptions. Functional ability and status Nutritional status Physical activity Advanced directives List of other  physicians Hospitalizations, surgeries, and ER visits in previous 12 months Vitals Screenings to include cognitive, depression, and falls Referrals and appointments  In addition, I have reviewed and discussed with patient certain preventive protocols, quality metrics, and best practice recommendations. A written personalized care plan for preventive services as well as general preventive health recommendations were provided to patient.     Sheral Flow, LPN   [id number]   Nurse Notes:  Patient is cogitatively intact. There were no vitals filed for this visit. There is no height or weight on file to calculate BMI.

## 2020-11-14 NOTE — Patient Instructions (Signed)
[name] , Thank you for taking time to come for your Medicare Wellness Visit. I appreciate your ongoing commitment to your health goals. Please review the following plan we discussed and let me know if I can assist you in the future.   Screening recommendations/referrals: Colonoscopy: last done 04/06/2011 Recommended yearly ophthalmology/optometry visit for glaucoma screening and checkup Recommended yearly dental visit for hygiene and checkup  Vaccinations: Influenza vaccine: due Fall 2022 Pneumococcal vaccine: 04/12/2016, 10/17/2017 Tdap vaccine: never done Shingles vaccine: never done   Covid-19: 05/29/2019, 06/22/2019  Advanced directives: Advance directive discussed with you today. Even though you declined this today please call our office should you change your mind and we can give you the proper paperwork for you to fill out.  Conditions/risks identified: Yes; No goals at this time.  Next appointment: Please schedule your next Medicare Wellness Visit with your Nurse Health Advisor in 1 year by calling [phone].  Preventive Care 19 Years and Older, Male Preventive care refers to lifestyle choices and visits with your health care provider that can promote health and wellness. What does preventive care include? A yearly physical exam. This is also called an annual well check. Dental exams once or twice a year. Routine eye exams. Ask your health care provider how often you should have your eyes checked. Personal lifestyle choices, including: Daily care of your teeth and gums. Regular physical activity. Eating a healthy diet. Avoiding tobacco and drug use. Limiting alcohol use. Practicing safe sex. Taking low doses of aspirin every day. Taking vitamin and mineral supplements as recommended by your health care provider. What happens during an annual well check? The services and screenings done by your health care provider during your annual well check will depend on your age,  overall health, lifestyle risk factors, and family history of disease. Counseling  Your health care provider may ask you questions about your: Alcohol use. Tobacco use. Drug use. Emotional well-being. Home and relationship well-being. Sexual activity. Eating habits. History of falls. Memory and ability to understand (cognition). Work and work Statistician. Screening  You may have the following tests or measurements: Height, weight, and BMI. Blood pressure. Lipid and cholesterol levels. These may be checked every 5 years, or more frequently if you are over 83 years old. Skin check. Lung cancer screening. You may have this screening every year starting at age 83 if you have a 30-pack-year history of smoking and currently smoke or have quit within the past 15 years. Fecal occult blood test (FOBT) of the stool. You may have this test every year starting at age 83. Flexible sigmoidoscopy or colonoscopy. You may have a sigmoidoscopy every 5 years or a colonoscopy every 10 years starting at age 46. Prostate cancer screening. Recommendations will vary depending on your family history and other risks. Hepatitis C blood test. Hepatitis B blood test. Sexually transmitted disease (STD) testing. Diabetes screening. 83 You may have this done every 1-3 years. Abdominal aortic aneurysm (AAA) screening. You may need this if you are a current or former smoker. Osteoporosis. You may be screened starting at age 83 if you are at high risk. Talk with your health care provider about your test results, treatment options, and if necessary, the need for more tests. Vaccines  Your health care provider may recommend certain vaccines, such as: Influenza vaccine. This is recommended every year. Tetanus, diphtheria, and acellular pertussis (Tdap, Td) vaccine.  You may need a Td booster every 10 years. Zoster vaccine.  You may need this after age 83. Pneumococcal 13-valent conjugate (PCV13) vaccine. One dose is recommended after age 15. Pneumococcal polysaccharide (PPSV23) vaccine. One dose is recommended after age 83. Talk to your health care provider about which screenings and vaccines you need and how often you need them. This information is not intended to replace advice given to you by your health care provider. Make sure you discuss any questions you have with your health care provider. Document Released: 02/11/2015 Document Revised: 10/05/2015 Document Reviewed: 11/16/2014 Elsevier Interactive Patient Education  2017 [place] Prevention in the Home Falls can cause injuries. They can happen to people of all ages. There are many things you can do to make your home safe and to help prevent falls. What can I do on the outside of my home? Regularly fix the edges of walkways and driveways and fix any cracks. Remove anything that might make you trip as you walk through a door, such as a raised step or threshold. Trim any bushes or trees on the path to your home. Use bright outdoor lighting. Clear any walking paths of anything that might make someone trip, such as rocks or tools. Regularly check to see if handrails are loose or broken. Make sure that both sides of any steps have handrails. Any raised decks and porches should have guardrails on the edges. Have any leaves, snow, or ice cleared regularly. Use sand or salt on walking paths during winter. Clean up any spills in your garage right away. This includes oil or grease spills. What can I do in the bathroom? Use night lights. Install grab bars by the toilet and in the tub and shower. Do not use towel bars as grab bars. Use non-skid mats or decals in the tub or shower. If you need to sit down in the shower, use a plastic, non-slip stool. Keep the floor dry. Clean up any water that spills on the floor as soon as it happens. Remove soap  buildup in the tub or shower regularly. Attach bath mats securely with double-sided non-slip rug tape. Do not have throw rugs and other things on the floor that can make you trip. What can I do in the bedroom? Use night lights. Make sure that you have a light by your bed that is easy to reach. Do not use any sheets or blankets that are too big for your bed. They should not hang down onto the floor. Have a firm chair that has side arms. You can use this for support while you get dressed. Do not have throw rugs and other things on the floor that can make you trip. What can I do in the kitchen? Clean up any spills right away. Avoid walking on wet floors. Keep items that you use a lot in easy-to-reach places. If you need to reach something above you, use a strong step stool that has a grab bar. Keep electrical cords out of the way. Do not use floor polish or wax that makes floors slippery. If you must use wax, use non-skid floor wax. Do not have throw rugs and other things on the floor that can make you trip. What can I do with my stairs? Do not leave any items on the stairs. Make sure that there are handrails on both sides of the stairs and use them. Fix handrails that are broken or loose. Make sure that handrails are as long as  the stairways. Check any carpeting to make sure that it is firmly attached to the stairs. Fix any carpet that is loose or worn. Avoid having throw rugs at the top or bottom of the stairs. If you do have throw rugs, attach them to the floor with carpet tape. Make sure that you have a light switch at the top of the stairs and the bottom of the stairs. If you do not have them, ask someone to add them for you. What else can I do to help prevent falls? Wear shoes that: Do not have high heels. Have rubber bottoms. Are comfortable and fit you well. Are closed at the toe. Do not wear sandals. If you use a stepladder: Make sure that it is fully opened. Do not climb a closed  stepladder. Make sure that both sides of the stepladder are locked into place. Ask someone to hold it for you, if possible. Clearly mark and make sure that you can see: Any grab bars or handrails. First and last steps. Where the edge of each step is. Use tools that help you move around (mobility aids) if they are needed. These include: Canes. Walkers. Scooters. Crutches. Turn on the lights when you go into a dark area. Replace any light bulbs as soon as they burn out. Set up your furniture so you have a clear path. Avoid moving your furniture around. If any of your floors are uneven, fix them. If there are any pets around you, be aware of where they are. Review your medicines with your doctor. Some medicines can make you feel dizzy. This can increase your chance of falling. Ask your doctor what other things that you can do to help prevent falls. This information is not intended to replace advice given to you by your health care provider. Make sure you discuss any questions you have with your health care provider. Document Released: 11/11/2008 Document Revised: 06/23/2015 Document Reviewed: 02/19/2014 Elsevier Interactive Patient Education  2017 [employer].

## 2021-01-25 DIAGNOSIS — R3912 Poor urinary stream: Secondary | ICD-10-CM | POA: Diagnosis not present

## 2021-03-08 ENCOUNTER — Other Ambulatory Visit: Payer: Self-pay

## 2021-03-08 ENCOUNTER — Ambulatory Visit (INDEPENDENT_AMBULATORY_CARE_PROVIDER_SITE_OTHER): Payer: Medicare Other | Admitting: Emergency Medicine

## 2021-03-08 ENCOUNTER — Encounter: Payer: Self-pay | Admitting: Emergency Medicine

## 2021-03-08 ENCOUNTER — Ambulatory Visit (INDEPENDENT_AMBULATORY_CARE_PROVIDER_SITE_OTHER): Payer: Medicare Other

## 2021-03-08 VITALS — BP 138/86 | HR 105 | Temp 98.3°F | Ht 64.0 in | Wt 215.0 lb

## 2021-03-08 DIAGNOSIS — K5730 Diverticulosis of large intestine without perforation or abscess without bleeding: Secondary | ICD-10-CM | POA: Diagnosis not present

## 2021-03-08 DIAGNOSIS — N401 Enlarged prostate with lower urinary tract symptoms: Secondary | ICD-10-CM

## 2021-03-08 DIAGNOSIS — M25561 Pain in right knee: Secondary | ICD-10-CM | POA: Insufficient documentation

## 2021-03-08 DIAGNOSIS — I10 Essential (primary) hypertension: Secondary | ICD-10-CM | POA: Diagnosis not present

## 2021-03-08 NOTE — Assessment & Plan Note (Signed)
Well-controlled hypertension. BP Readings from Last 3 Encounters:  03/08/21 138/86  09/05/20 138/80  12/20/19 139/74

## 2021-03-08 NOTE — Assessment & Plan Note (Signed)
Stable and well-controlled.  Continue Flomax 0.4 mg and finasteride 5 mg daily.

## 2021-03-08 NOTE — Assessment & Plan Note (Signed)
Stable.  No recent bleeding.

## 2021-03-08 NOTE — Patient Instructions (Signed)
Health Maintenance After Age 84 After age 84, you are at a higher risk for certain long-term diseases and infections as well as injuries from falls. Falls are a major cause of broken bones and head injuries in people who are older than age 84. Getting regular preventive care can help to keep you healthy and well. Preventive care includes getting regular testing and making lifestyle changes as recommended by your health care provider. Talk with your health care provider about: Which screenings and tests you should have. A screening is a test that checks for a disease when you have no symptoms. A diet and exercise plan that is right for you. What should I know about screenings and tests to prevent falls? Screening and testing are the best ways to find a health problem early. Early diagnosis and treatment give you the best chance of managing medical conditions that are common after age 84. Certain conditions and lifestyle choices may make you more likely to have a fall. Your health care provider may recommend: Regular vision checks. Poor vision and conditions such as cataracts can make you more likely to have a fall. If you wear glasses, make sure to get your prescription updated if your vision changes. Medicine review. Work with your health care provider to regularly review all of the medicines you are taking, including over-the-counter medicines. Ask your health care provider about any side effects that may make you more likely to have a fall. Tell your health care provider if any medicines that you take make you feel dizzy or sleepy. Strength and balance checks. Your health care provider may recommend certain tests to check your strength and balance while standing, walking, or changing positions. Foot health exam. Foot pain and numbness, as well as not wearing proper footwear, can make you more likely to have a fall. Screenings, including: Osteoporosis screening. Osteoporosis is a condition that causes  the bones to get weaker and break more easily. Blood pressure screening. Blood pressure changes and medicines to control blood pressure can make you feel dizzy. Depression screening. You may be more likely to have a fall if you have a fear of falling, feel depressed, or feel unable to do activities that you used to do. Alcohol use screening. Using too much alcohol can affect your balance and may make you more likely to have a fall. Follow these instructions at home: Lifestyle Do not drink alcohol if: Your health care provider tells you not to drink. If you drink alcohol: Limit how much you have to: 0-1 drink a day for women. 0-2 drinks a day for men. Know how much alcohol is in your drink. In the [place] one drink equals one 12 oz bottle of beer (355 mL), one 5 oz glass of wine (148 mL), or one 1 oz glass of hard liquor (44 mL). Do not use any products that contain nicotine or tobacco. These products include cigarettes, chewing tobacco, and vaping devices, such as e-cigarettes. If you need help quitting, ask your health care provider. Activity  Follow a regular exercise program to stay fit. This will help you maintain your balance. Ask your health care provider what types of exercise are appropriate for you. If you need a cane or walker, use it as recommended by your health care provider. Wear supportive shoes that have nonskid soles. Safety  Remove any tripping hazards, such as rugs, cords, and clutter. Install safety equipment such as grab bars in bathrooms and safety rails on stairs. Keep rooms and walkways   well-lit. General instructions Talk with your health care provider about your risks for falling. Tell your health care provider if: You fall. Be sure to tell your health care provider about all falls, even ones that seem minor. You feel dizzy, tiredness (fatigue), or off-balance. Take over-the-counter and prescription medicines only as told by your health care provider. These include  supplements. Eat a healthy diet and maintain a healthy weight. A healthy diet includes low-fat dairy products, low-fat (lean) meats, and fiber from whole grains, beans, and lots of fruits and vegetables. Stay current with your vaccines. Schedule regular health, dental, and eye exams. Summary Having a healthy lifestyle and getting preventive care can help to protect your health and wellness after age 84. Screening and testing are the best way to find a health problem early and help you avoid having a fall. Early diagnosis and treatment give you the best chance for managing medical conditions that are more common for people who are older than age 84. Falls are a major cause of broken bones and head injuries in people who are older than age 84. Take precautions to prevent a fall at home. Work with your health care provider to learn what changes you can make to improve your health and wellness and to prevent falls. This information is not intended to replace advice given to you by your health care provider. Make sure you discuss any questions you have with your health care provider. Document Revised: 06/06/2020 Document Reviewed: 06/06/2020 Elsevier Patient Education  2022 Elsevier Inc[employer]  

## 2021-03-08 NOTE — Assessment & Plan Note (Signed)
Most likely osteoarthritic pain. Advised to take Tylenol as needed for pain. We will get x-ray today.

## 2021-03-08 NOTE — Progress Notes (Signed)
Steven Vargas 86 y.o.   Chief Complaint  Patient presents with   Follow-up   Hypertension   right leg pain    Right leg pain x 1 month     HISTORY OF PRESENT ILLNESS: This is a 86 y.o. male with history of hypertension here for follow-up. Also has history of BPH.  Stable.  Doing well. Complaining of right knee pain for about a month.  Thinks he pulled a muscle. Not taking any over-the-counter analgesics. No other complaints or medical concerns today. BP Readings from Last 3 Encounters:  09/05/20 138/80  12/20/19 139/74  10/21/19 (!) 156/92     Hypertension Pertinent negatives include no chest pain, headaches, palpitations or shortness of breath.    Prior to Admission medications   Medication Sig Start Date End Date Taking? Authorizing Provider  finasteride (PROSCAR) 5 MG tablet Take 5 mg by mouth daily.   Yes [provider]  tamsulosin (FLOMAX) 0.4 MG CAPS capsule Take 0.4 mg by mouth.   Yes [provider]  pantoprazole (PROTONIX) 40 MG tablet Take 1 tablet (40 mg total) by mouth 2 (two) times daily for 14 days. 12/20/19 09/05/20  Terrilee Croak, MD    Allergies  Allergen Reactions   Pravastatin Swelling and Other (See Comments)    Reported jaw and lip swelling along with myalgias in the back with pravastatin.  Symptoms resolved with stopping.     Patient Active Problem List   Diagnosis Date Noted   Benign prostatic hyperplasia with lower urinary tract symptoms 09/05/2020   Encounter to establish care 09/05/2020   History of gastric ulcer 09/05/2020   History of GI bleed 09/05/2020   Essential hypertension 09/05/2020   Diverticulosis of colon 04/06/2011   Benign neoplasm of colon 04/06/2011   Diverticulosis of colon with hemorrhage 04/06/2011   Diaphragmatic hernia without mention of obstruction or gangrene 04/06/2011    Past Medical History:  Diagnosis Date   Gastric ulcer    secondary to nsaid 10 years ago   GI bleed 04/04/2011    Hip pain, chronic    right   HLD (hyperlipidemia)    Hypertension    Obesity (BMI 35.0-39.9 without comorbidity) 03/2011   Stroke ([place])    2005    Past Surgical History:  Procedure Laterality Date   BIOPSY  12/17/2019   Procedure: BIOPSY;  Surgeon: Wilford Corner, MD;  Location: [facility] ENDOSCOPY;  Service: Endoscopy;;   ESOPHAGOGASTRODUODENOSCOPY [name] 12/17/2019   Procedure: ESOPHAGOGASTRODUODENOSCOPY (EGD);  Surgeon: Wilford Corner, MD;  Location: [place];  Service: Endoscopy;  Laterality: [name];   TONSILLECTOMY      Social History   Socioeconomic History   Marital status: Married    Spouse name: Not on file   Number of children: Not on file   Years of education: Not on file   Highest education level: Not on file  Occupational History   Not on file  Tobacco Use   Smoking status: Former   Smokeless tobacco: Never  Substance and Sexual Activity   Alcohol use: Yes    Comment: occasional   Drug use: No   Sexual activity: Not Currently  Other Topics Concern   Not on file  Social History Narrative   Lives in [place], [place] with his wife and son. 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 Drinks 2-3 beers once every few weeks. Smoking 2 years ago. Used to smoke 2-3 packs per day before that for past many years. Never did illicit  drugs. Has [employer] health care Medicare.   Social Determinants of Health   Financial Resource Strain: Low Risk    Difficulty of Paying Living Expenses: Not hard at all  Food Insecurity: No Food Insecurity   Worried About Charity fundraiser in the Last Year: Never true   [place] in the Last Year: Never true  Transportation Needs: No Transportation Needs   Lack of Transportation (Medical): No   Lack of Transportation (Non-Medical): No  Physical Activity: Sufficiently Active   Days of Exercise per Week: 5 days   Minutes of Exercise per Session: 30 min  Stress: No Stress Concern Present    Feeling of Stress : Not at all  Social Connections: Socially Integrated   Frequency of Communication with Friends and Family: More than three times a week   Frequency of Social Gatherings with Friends and Family: More than three times a week   Attends Religious Services: More than 4 times per year   Active Member of [employer] or Organizations: Yes   Attends Music therapist: More than 4 times per year   Marital Status: Married  Human resources officer Violence: Not At Risk   Fear of Current or Ex-Partner: No   Emotionally Abused: No   Physically Abused: No   Sexually Abused: No    Family History  Problem Relation Age of Onset   Stroke Mother    Kidney disease Brother    Diabetes Brother      Review of Systems  Constitutional: Negative.  Negative for chills and fever.  HENT: Negative.  Negative for congestion and sore throat.   Respiratory: Negative.  Negative for cough and shortness of breath.   Cardiovascular: Negative.  Negative for chest pain and palpitations.  Gastrointestinal: Negative.  Negative for abdominal pain, diarrhea, nausea and vomiting.  Genitourinary: Negative.  Negative for dysuria and hematuria.  Musculoskeletal:  Positive for joint pain (Right knee pain).  Skin: Negative.  Negative for rash.  Neurological: Negative.  Negative for dizziness and headaches.  All other systems reviewed and are negative.   Physical Exam Vitals reviewed.  Constitutional:      Appearance: Normal appearance.  HENT:     Head: Normocephalic.  Eyes:     Extraocular Movements: Extraocular movements intact.     Conjunctiva/sclera: Conjunctivae normal.     Pupils: Pupils are equal, round, and reactive to light.  Cardiovascular:     Rate and Rhythm: Normal rate and regular rhythm.     Pulses: Normal pulses.     Heart sounds: Normal heart sounds.  Pulmonary:     Effort: Pulmonary effort is normal.     Breath sounds: Normal breath sounds.  Abdominal:      Palpations: Abdomen is soft.     Tenderness: There is no abdominal tenderness.  Musculoskeletal:     Cervical back: No tenderness.     Comments: Right lower extremity: No signs of DVT.  No erythema or ecchymosis.  No tenderness to palpation.  Right knee: No swelling.  Complaining of pain during range of motion.  Mild crepitation.  Stable in flexion and extension.  Lymphadenopathy:     Cervical: No cervical adenopathy.  Skin:    General: Skin is warm and dry.     Capillary Refill: Capillary refill takes less than 2 seconds.  Neurological:     General: No focal deficit present.     Mental Status: He is alert and oriented to person, place, and time.  Psychiatric:  Mood and Affect: Mood normal.        Behavior: Behavior normal.   DG Knee Complete 4 Views Right  Result Date: 03/08/2021 CLINICAL DATA:  Right knee pain, no reported injury. EXAM: RIGHT KNEE - COMPLETE 4+ VIEW COMPARISON:  None. FINDINGS: No joint effusion or fracture. Trace patellofemoral osteophytosis. Osteopenia. IMPRESSION: 1. No acute findings. 2. Trace patellofemoral osteophytosis. Electronically Signed   By: Lorin Picket M.D.   On: 03/08/2021 10:47     ASSESSMENT & PLAN: A total of 45-minute was spent with the patient and counseling/coordination of care regarding preparing for this visit, review of most recent office visit notes, review of all medications, review of most recent blood work results, differential diagnosis of right knee pain including possible diagnosis of osteoarthritis, need for x-ray and x-ray review, review of chronic medical problems under management, prognosis, documentation, need for follow-up.  Problem List Items Addressed This Visit       Cardiovascular and Mediastinum   Essential hypertension - Primary    Well-controlled hypertension. BP Readings from Last 3 Encounters:  03/08/21 138/86  09/05/20 138/80  12/20/19 139/74           Digestive   Diverticulosis of colon    Stable.   No recent bleeding.        Genitourinary   Benign prostatic hyperplasia with lower urinary tract symptoms    Stable and well-controlled.  Continue Flomax 0.4 mg and finasteride 5 mg daily.        Other   Acute pain of right knee    Most likely osteoarthritic pain. Advised to take Tylenol as needed for pain. We will get x-ray today.      Relevant Orders   DG Knee Complete 4 Views Right (Completed)   Patient Instructions  Health Maintenance After Age 56 After age 17, you are at a higher risk for certain long-term diseases and infections as well as injuries from falls. Falls are a major cause of broken bones and head injuries in people who are older than age 80. Getting regular preventive care can help to keep you healthy and well. Preventive care includes getting regular testing and making lifestyle changes as recommended by your health care provider. Talk with your health care provider about: Which screenings and tests you should have. A screening is a test that checks for a disease when you have no symptoms. A diet and exercise plan that is right for you. What should I know about screenings and tests to prevent falls? Screening and testing are the best ways to find a health problem early. Early diagnosis and treatment give you the best chance of managing medical conditions that are common after age 37. Certain conditions and lifestyle choices may make you more likely to have a fall. Your health care provider may recommend: Regular vision checks. Poor vision and conditions such as cataracts can make you more likely to have a fall. If you wear glasses, make sure to get your prescription updated if your vision changes. Medicine review. Work with your health care provider to regularly review all of the medicines you are taking, including over-the-counter medicines. Ask your health care provider about any side effects that may make you more likely to have a fall. Tell your health care provider  if any medicines that you take make you feel dizzy or sleepy. Strength and balance checks. Your health care provider may recommend certain tests to check your strength and balance while standing, walking, or changing positions.  Foot health exam. Foot pain and numbness, as well as not wearing proper footwear, can make you more likely to have a fall. Screenings, including: Osteoporosis screening. Osteoporosis is a condition that causes the bones to get weaker and break more easily. Blood pressure screening. Blood pressure changes and medicines to control blood pressure can make you feel dizzy. Depression screening. You may be more likely to have a fall if you have a fear of falling, feel depressed, or feel unable to do activities that you used to do. Alcohol use screening. Using too much alcohol can affect your balance and may make you more likely to have a fall. Follow these instructions at home: Lifestyle Do not drink alcohol if: Your health care provider tells you not to drink. If you drink alcohol: Limit how much you have to: 0-1 drink a day for women. 0-2 drinks a day for men. Know how much alcohol is in your drink. In the [place] one drink equals one 12 oz bottle of beer (355 mL), one 5 oz glass of wine (148 mL), or one 1 oz glass of hard liquor (44 mL). Do not use any products that contain nicotine or tobacco. These products include cigarettes, chewing tobacco, and vaping devices, such as e-cigarettes. If you need help quitting, ask your health care provider. Activity  Follow a regular exercise program to stay fit. This will help you maintain your balance. Ask your health care provider what types of exercise are appropriate for you. If you need a cane or walker, use it as recommended by your health care provider. Wear supportive shoes that have nonskid soles. Safety  Remove any tripping hazards, such as rugs, cords, and clutter. Install safety equipment such as grab bars in bathrooms  and safety rails on stairs. Keep rooms and walkways well-lit. General instructions Talk with your health care provider about your risks for falling. Tell your health care provider if: You fall. Be sure to tell your health care provider about all falls, even ones that seem minor. You feel dizzy, tiredness (fatigue), or off-balance. Take over-the-counter and prescription medicines only as told by your health care provider. These include supplements. Eat a healthy diet and maintain a healthy weight. A healthy diet includes low-fat dairy products, low-fat (lean) meats, and fiber from whole grains, beans, and lots of fruits and vegetables. Stay current with your vaccines. Schedule regular health, dental, and eye exams. Summary Having a healthy lifestyle and getting preventive care can help to protect your health and wellness after age 31. Screening and testing are the best way to find a health problem early and help you avoid having a fall. Early diagnosis and treatment give you the best chance for managing medical conditions that are more common for people who are older than age 49. Falls are a major cause of broken bones and head injuries in people who are older than age 25. Take precautions to prevent a fall at home. Work with your health care provider to learn what changes you can make to improve your health and wellness and to prevent falls. This information is not intended to replace advice given to you by your health care provider. Make sure you discuss any questions you have with your health care provider. Document Revised: 06/06/2020 Document Reviewed: 06/06/2020 Elsevier Patient Education  2022 [place], MD [facility]

## 2021-03-15 ENCOUNTER — Telehealth: Payer: Self-pay

## 2021-03-15 NOTE — Telephone Encounter (Signed)
Pt is calling for results to his imaging done on 03/08/21. I advised pt that I didn't see a result note in from Dr. Mitchel Honour.  Please advise Pt CB [phone]

## 2021-03-15 NOTE — Telephone Encounter (Signed)
X-ray unremarkable without acute findings.  Thanks.

## 2021-03-16 NOTE — Telephone Encounter (Signed)
Called patient and notified him of his results. Patient verbalized understanding.

## 2021-11-16 ENCOUNTER — Ambulatory Visit (INDEPENDENT_AMBULATORY_CARE_PROVIDER_SITE_OTHER): Payer: Medicare Other

## 2021-11-16 DIAGNOSIS — Z0000 Encounter for general adult medical examination without abnormal findings: Secondary | ICD-10-CM | POA: Diagnosis not present

## 2021-11-16 NOTE — Progress Notes (Signed)
Virtual Visit via Telephone Note  I connected with  Steven Vargas on 11/16/21 at 10:45 AM EDT by telephone and verified that I am speaking with the correct person using two identifiers.  Location: Patient: Home Provider: [place] Persons participating in the virtual visit: [place]   I discussed the limitations, risks, security and privacy concerns of performing an evaluation and management service by telephone and the availability of in person appointments. The patient expressed understanding and agreed to proceed.  Interactive audio and video telecommunications were attempted between this nurse and patient, however failed, due to patient having technical difficulties OR patient did not have access to video capability.  We continued and completed visit with audio only.  Some vital signs may be absent or patient reported.   Sheral Flow, LPN  Subjective:   Steven Vargas is a 86 y.o. male who presents for Medicare Annual/Subsequent preventive examination.  Review of Systems     Cardiac Risk Factors include: advanced age (>29mn, >>51women);family history of premature cardiovascular disease;male gender     Objective:    There were no vitals filed for this visit. There is no height or weight on file to calculate BMI.     11/16/2021   10:50 AM 11/14/2020    9:41 AM 12/17/2019    9:10 AM 12/16/2019    4:54 PM 12/15/2019    7:47 PM 10/18/2016   11:17 AM 04/12/2016   10:25 AM  Advanced Directives  Does Patient Have a Medical Advance Directive? No No No  No No No  Would patient like information on creating a medical advance directive? No - Patient declined No - Patient declined No - Patient declined No - Patient declined  No - Patient declined Yes (ED - Information included in AVS)    Current Medications (verified) Outpatient Encounter Medications as of 11/16/2021  Medication Sig   finasteride (PROSCAR) 5 MG tablet Take 5 mg by mouth daily.    pantoprazole (PROTONIX) 40 MG tablet Take 1 tablet (40 mg total) by mouth 2 (two) times daily for 14 days.   tamsulosin (FLOMAX) 0.4 MG CAPS capsule Take 0.4 mg by mouth.   vitamin B-12 (CYANOCOBALAMIN) 500 MCG tablet Take 500 mcg by mouth daily.   No facility-administered encounter medications on file as of 11/16/2021.    Allergies (verified) Pravastatin   History: Past Medical History:  Diagnosis Date   Gastric ulcer    secondary to nsaid 10 years ago   GI bleed 04/04/2011   Hip pain, chronic    right   HLD (hyperlipidemia)    Hypertension    Obesity (BMI 35.0-39.9 without comorbidity) 03/2011   Stroke ([facility]    2005   Past Surgical History:  Procedure Laterality Date   BIOPSY  12/17/2019   Procedure: BIOPSY;  Surgeon: SWilford Corner MD;  [facility];  Service: Endoscopy;;   ESOPHAGOGASTRODUODENOSCOPY [name] 12/17/2019   Procedure: ESOPHAGOGASTRODUODENOSCOPY (EGD);  Surgeon: SWilford Corner MD;  [place] [facility]  Service: Endoscopy;  Laterality: [name];   TONSILLECTOMY     Family History  Problem Relation Age of Onset   Stroke Mother    Kidney disease Brother    Diabetes Brother    Social History   Socioeconomic History   Marital status: Married    Spouse name: Not on file   Number of children: Not on file   Years of education: Not on file   Highest education level: Not on file  Occupational History   Not  on file  Tobacco Use   Smoking status: Former   Smokeless tobacco: Never  Substance and Sexual Activity   Alcohol use: Yes    Comment: occasional   Drug use: No   Sexual activity: Not Currently  Other Topics Concern   Not on file  Social History Narrative   Lives in [place], [place] with his wife and son. 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 Drinks 2-3 beers once every few weeks. Smoking 2 years ago. Used to smoke 2-3 packs per day before that for past many years. Never did illicit drugs. Has United health care  Medicare.   Social Determinants of Health   Financial Resource Strain: Low Risk  (11/16/2021)   Overall Financial Resource Strain (CARDIA)    Difficulty of Paying Living Expenses: Not hard at all  Food Insecurity: No Food Insecurity (11/16/2021)   Hunger Vital Sign    Worried About Running Out of Food in the Last Year: Never true    Ran Out of Food in the Last Year: Never true  Transportation Needs: No Transportation Needs (11/16/2021)   PRAPARE - Hydrologist (Medical): No    Lack of Transportation (Non-Medical): No  Physical Activity: Sufficiently Active (11/16/2021)   Exercise Vital Sign    Days of Exercise per Week: 5 days    Minutes of Exercise per Session: 30 min  Stress: No Stress Concern Present (11/16/2021)   [place]    Feeling of Stress : Not at all  Social Connections: [place] (11/16/2021)   Social Connection and Isolation Panel [NHANES]    Frequency of Communication with Friends and Family: More than three times a week    Frequency of Social Gatherings with Friends and Family: More than three times a week    Attends Religious Services: More than 4 times per year    Active Member of [employer] or Organizations: Yes    Attends Music therapist: More than 4 times per year    Marital Status: Married    Tobacco Counseling Counseling given: Not Answered   Clinical Intake:  Pre-visit preparation completed: Yes  Pain : No/denies pain     Nutritional Risks: None Diabetes: No  How often do you need to have someone help you when you read instructions, pamphlets, or other written materials from your doctor or pharmacy?: 1 - Never What is the last grade level you completed in school?: HSG  Diabetic? no  Interpreter Needed?: No  Information entered by :: Lisette Abu, LPN.   Activities of Daily Living    11/16/2021   10:55 AM  In your  present state of health, do you have any difficulty performing the following activities:  Hearing? 0  Vision? 0  Difficulty concentrating or making decisions? 0  Walking or climbing stairs? 0  Dressing or bathing? 0  Doing errands, shopping? 0  Preparing Food and eating ? N  Using the Toilet? N  In the past six months, have you accidently leaked urine? N  Do you have problems with loss of bowel control? N  Managing your Medications? N  Managing your Finances? N  Housekeeping or managing your Housekeeping? N    Patient Care Team: Horald Pollen, MD as PCP - General (Internal Medicine)  Indicate any recent Medical Services you may have received from other than [facility] providers in the past year (date may be approximate).  Assessment:   This is a routine wellness examination for Steven Vargas.  Hearing/Vision screen Hearing Screening - Comments:: Denies hearing difficulties   Vision Screening - Comments:: Wears readers- up to date with routine eye exams with [facility]   Dietary issues and exercise activities discussed: Current Exercise Habits: Home exercise routine, Type of exercise: walking, Time (Minutes): 30, Frequency (Times/Week): 5, Weekly Exercise (Minutes/Week): 150, Intensity: Moderate, Exercise limited by: None identified   Goals Addressed             This Visit's Progress    To maintain my current health status by continuing to eat healthy, stay physically active and socially active.        Depression Screen    11/16/2021   10:53 AM 03/08/2021   10:01 AM 11/14/2020    9:40 AM 09/05/2020    1:36 PM 10/21/2019   10:25 AM 04/20/2019   10:39 AM 10/20/2018   10:18 AM  PHQ 2/9 Scores  PHQ - 2 Score 0 0 0 0 0 0 0    Fall Risk    11/16/2021   10:51 AM 11/14/2020    9:44 AM 09/05/2020    1:06 PM 02/15/2020   12:17 PM 10/21/2019   10:24 AM  Fall Risk   Falls in the past year? 0 0 0 0 0  Number falls in past yr: 0 0 0    Injury with Fall? 0 0 0    Risk for  fall due to : No Fall Risks No Fall Risks     Follow up Falls prevention discussed Falls evaluation completed  Falls evaluation completed Falls evaluation completed    [place]:  Any stairs in or around the home? No  If so, are there any without handrails? No  Home free of loose throw rugs in walkways, pet beds, electrical cords, etc? Yes  Adequate lighting in your home to reduce risk of falls? Yes   ASSISTIVE DEVICES UTILIZED TO PREVENT FALLS:  Life alert? No  Use of a cane, walker or w/c? No  Grab bars in the bathroom? Yes  Shower chair or bench in shower? Yes  Elevated toilet seat or a handicapped toilet? No   TIMED UP AND GO:  Was the test performed? No . Phone Visit   Cognitive Function:        11/16/2021   10:57 AM  6CIT Screen  What Year? 0 points  What month? 0 points  What time? 0 points  Count back from 20 0 points  Months in reverse 0 points  Repeat phrase 0 points  Total Score 0 points    Immunizations Immunization History  Administered Date(s) Administered   Fluad Quad(high Dose 65+) 10/20/2018   Influenza, High Dose Seasonal PF 10/17/2017   Influenza,inj,Quad PF,6+ Mos 10/18/2016   PFIZER(Purple Top)SARS-COV-2 Vaccination 05/29/2019, 06/22/2019   Pneumococcal Conjugate-13 04/12/2016   Pneumococcal Polysaccharide-23 10/17/2017    TDAP status: Due, Education has been provided regarding the importance of this vaccine. Advised may receive this vaccine at local pharmacy or Health Dept. Aware to provide a copy of the vaccination record if obtained from local pharmacy or Health Dept. Verbalized acceptance and understanding.  Flu Vaccine status: Due, Education has been provided regarding the importance of this vaccine. Advised may receive this vaccine at local pharmacy or Health Dept. Aware to provide a copy of the vaccination record if obtained from local pharmacy or Health Dept. Verbalized acceptance and  understanding.  Pneumococcal vaccine  status: Up to date  Covid-19 vaccine status: Completed vaccines  Qualifies for Shingles Vaccine? Yes   Zostavax completed No   Shingrix Completed?: No.    Education has been provided regarding the importance of this vaccine. Patient has been advised to call insurance company to determine out of pocket expense if they have not yet received this vaccine. Advised may also receive vaccine at local pharmacy or Health Dept. Verbalized acceptance and understanding.  Screening Tests Health Maintenance  Topic Date Due   TETANUS/TDAP  Never done   Zoster Vaccines- Shingrix (1 of 2) Never done   COVID-19 Vaccine (3 - Pfizer series) 08/17/2019   INFLUENZA VACCINE  08/29/2021   Pneumonia Vaccine 32+ Years old  Completed   HPV VACCINES  Aged Out    Health Maintenance  Health Maintenance Due  Topic Date Due   TETANUS/TDAP  Never done   Zoster Vaccines- Shingrix (1 of 2) Never done   COVID-19 Vaccine (3 - Pfizer series) 08/17/2019   INFLUENZA VACCINE  08/29/2021    Colorectal cancer screening: No longer required.   Lung Cancer Screening: (Low Dose CT Chest recommended if Age 39-80 years, 30 pack-year currently smoking OR have quit w/in 15years.) does not qualify.   Lung Cancer Screening Referral: no  Additional Screening:  Hepatitis C Screening: does not qualify; Completed no  Vision Screening: Recommended annual ophthalmology exams for early detection of glaucoma and other disorders of the eye. Is the patient up to date with their annual eye exam?  Yes  Who is the provider or what is the name of the office in which the patient attends annual eye exams? [facility] If pt is not established with a provider, would they like to be referred to a provider to establish care? No .   Dental Screening: Recommended annual dental exams for proper oral hygiene  Community Resource Referral / Chronic Care Management: CRR required this visit?  No   CCM  required this visit?  No      Plan:     I have personally reviewed and noted the following in the patient's chart:   Medical and social history Use of alcohol, tobacco or illicit drugs  Current medications and supplements including opioid prescriptions. Patient is not currently taking opioid prescriptions. Functional ability and status Nutritional status Physical activity Advanced directives List of other physicians Hospitalizations, surgeries, and ER visits in previous 12 months Vitals Screenings to include cognitive, depression, and falls Referrals and appointments  In addition, I have reviewed and discussed with patient certain preventive protocols, quality metrics, and best practice recommendations. A written personalized care plan for preventive services as well as general preventive health recommendations were provided to patient.     Sheral Flow, LPN   [id number]   Nurse Notes: N./A

## 2021-11-16 NOTE — Patient Instructions (Signed)
Steven Vargas , Thank you for taking time to come for your Medicare Wellness Visit. I appreciate your ongoing commitment to your health goals. Please review the following plan we discussed and let me know if I can assist you in the future.   These are the goals we discussed:  Goals      To maintain my current health status by continuing to eat healthy, stay physically active and socially active.        This is a list of the screening recommended for you and due dates:  Health Maintenance  Topic Date Due   Tetanus Vaccine  Never done   Zoster (Shingles) Vaccine (1 of 2) Never done   COVID-19 Vaccine (3 - Pfizer series) 08/17/2019   Flu Shot  08/29/2021   Pneumonia Vaccine  Completed   HPV Vaccine  Aged Out    Advanced directives: No  Conditions/risks identified: Yes  Next appointment: Follow up in one year for your annual wellness visit.   Preventive Care 58 Years and Older, Male  Preventive care refers to lifestyle choices and visits with your health care provider that can promote health and wellness. What does preventive care include? A yearly physical exam. This is also called an annual well check. Dental exams once or twice a year. Routine eye exams. Ask your health care provider how often you should have your eyes checked. Personal lifestyle choices, including: Daily care of your teeth and gums. Regular physical activity. Eating a healthy diet. Avoiding tobacco and drug use. Limiting alcohol use. Practicing safe sex. Taking low doses of aspirin every day. Taking vitamin and mineral supplements as recommended by your health care provider. What happens during an annual well check? The services and screenings done by your health care provider during your annual well check will depend on your age, overall health, lifestyle risk factors, and family history of disease. Counseling  Your health care provider may ask you questions about your: Alcohol use. Tobacco use. Drug  use. Emotional well-being. Home and relationship well-being. Sexual activity. Eating habits. History of falls. Memory and ability to understand (cognition). Work and work Statistician. Screening  You may have the following tests or measurements: Height, weight, and BMI. Blood pressure. Lipid and cholesterol levels. These may be checked every 5 years, or more frequently if you are over 25 years old. Skin check. Lung cancer screening. You may have this screening every year starting at age 41 if you have a 30-pack-year history of smoking and currently smoke or have quit within the past 15 years. Fecal occult blood test (FOBT) of the stool. You may have this test every year starting at age 8. Flexible sigmoidoscopy or colonoscopy. You may have a sigmoidoscopy every 5 years or a colonoscopy every 10 years starting at age 3. Prostate cancer screening. Recommendations will vary depending on your family history and other risks. Hepatitis C blood test. Hepatitis B blood test. Sexually transmitted disease (STD) testing. Diabetes screening. This is done by checking your blood sugar (glucose) after you have not eaten for a while (fasting). You may have this done every 1-3 years. Abdominal aortic aneurysm (AAA) screening. You may need this if you are a current or former smoker. Osteoporosis. You may be screened starting at age 73 if you are at high risk. Talk with your health care provider about your test results, treatment options, and if necessary, the need for more tests. Vaccines  Your health care provider may recommend certain vaccines, such as: Influenza vaccine.  This is recommended every year. Tetanus, diphtheria, and acellular pertussis (Tdap, Td) vaccine. You may need a Td booster every 10 years. Zoster vaccine. You may need this after age 28. Pneumococcal 13-valent conjugate (PCV13) vaccine. One dose is recommended after age 89. Pneumococcal polysaccharide (PPSV23) vaccine. One dose is  recommended after age 35. Talk to your health care provider about which screenings and vaccines you need and how often you need them. This information is not intended to replace advice given to you by your health care provider. Make sure you discuss any questions you have with your health care provider. Document Released: 02/11/2015 Document Revised: 10/05/2015 Document Reviewed: 11/16/2014 Elsevier Interactive Patient Education  2017 [place] Prevention in the Home Falls can cause injuries. They can happen to people of all ages. There are many things you can do to make your home safe and to help prevent falls. What can I do on the outside of my home? Regularly fix the edges of walkways and driveways and fix any cracks. Remove anything that might make you trip as you walk through a door, such as a raised step or threshold. Trim any bushes or trees on the path to your home. Use bright outdoor lighting. Clear any walking paths of anything that might make someone trip, such as rocks or tools. Regularly check to see if handrails are loose or broken. Make sure that both sides of any steps have handrails. Any raised decks and porches should have guardrails on the edges. Have any leaves, snow, or ice cleared regularly. Use sand or salt on walking paths during winter. Clean up any spills in your garage right away. This includes oil or grease spills. What can I do in the bathroom? Use night lights. Install grab bars by the toilet and in the tub and shower. Do not use towel bars as grab bars. Use non-skid mats or decals in the tub or shower. If you need to sit down in the shower, use a plastic, non-slip stool. Keep the floor dry. Clean up any water that spills on the floor as soon as it happens. Remove soap buildup in the tub or shower regularly. Attach bath mats securely with double-sided non-slip rug tape. Do not have throw rugs and other things on the floor that can make you  trip. What can I do in the bedroom? Use night lights. Make sure that you have a light by your bed that is easy to reach. Do not use any sheets or blankets that are too big for your bed. They should not hang down onto the floor. Have a firm chair that has side arms. You can use this for support while you get dressed. Do not have throw rugs and other things on the floor that can make you trip. What can I do in the kitchen? Clean up any spills right away. Avoid walking on wet floors. Keep items that you use a lot in easy-to-reach places. If you need to reach something above you, use a strong step stool that has a grab bar. Keep electrical cords out of the way. Do not use floor polish or wax that makes floors slippery. If you must use wax, use non-skid floor wax. Do not have throw rugs and other things on the floor that can make you trip. What can I do with my stairs? Do not leave any items on the stairs. Make sure that there are handrails on both sides of the stairs and use them. Fix handrails  that are broken or loose. Make sure that handrails are as long as the stairways. Check any carpeting to make sure that it is firmly attached to the stairs. Fix any carpet that is loose or worn. Avoid having throw rugs at the top or bottom of the stairs. If you do have throw rugs, attach them to the floor with carpet tape. Make sure that you have a light switch at the top of the stairs and the bottom of the stairs. If you do not have them, ask someone to add them for you. What else can I do to help prevent falls? Wear shoes that: Do not have high heels. Have rubber bottoms. Are comfortable and fit you well. Are closed at the toe. Do not wear sandals. If you use a stepladder: Make sure that it is fully opened. Do not climb a closed stepladder. Make sure that both sides of the stepladder are locked into place. Ask someone to hold it for you, if possible. Clearly mark and make sure that you can  see: Any grab bars or handrails. First and last steps. Where the edge of each step is. Use tools that help you move around (mobility aids) if they are needed. These include: Canes. Walkers. Scooters. Crutches. Turn on the lights when you go into a dark area. Replace any light bulbs as soon as they burn out. Set up your furniture so you have a clear path. Avoid moving your furniture around. If any of your floors are uneven, fix them. If there are any pets around you, be aware of where they are. Review your medicines with your doctor. Some medicines can make you feel dizzy. This can increase your chance of falling. Ask your doctor what other things that you can do to help prevent falls. This information is not intended to replace advice given to you by your health care provider. Make sure you discuss any questions you have with your health care provider. Document Released: 11/11/2008 Document Revised: 06/23/2015 Document Reviewed: 02/19/2014 Elsevier Interactive Patient Education  2017 [employer].

## 2021-11-18 IMAGING — CR DG CHEST 2V
2 series · 2 of 2 positions shown · non-contrast
Comparison: Insert agree CT abdomen pelvis 12/15/2019, radiograph
04/04/2011

CLINICAL DATA: GI bleeding, concern for malignancy, evaluate
pulmonary nodules

EXAM:
CHEST - 2 VIEW

[chest lat]
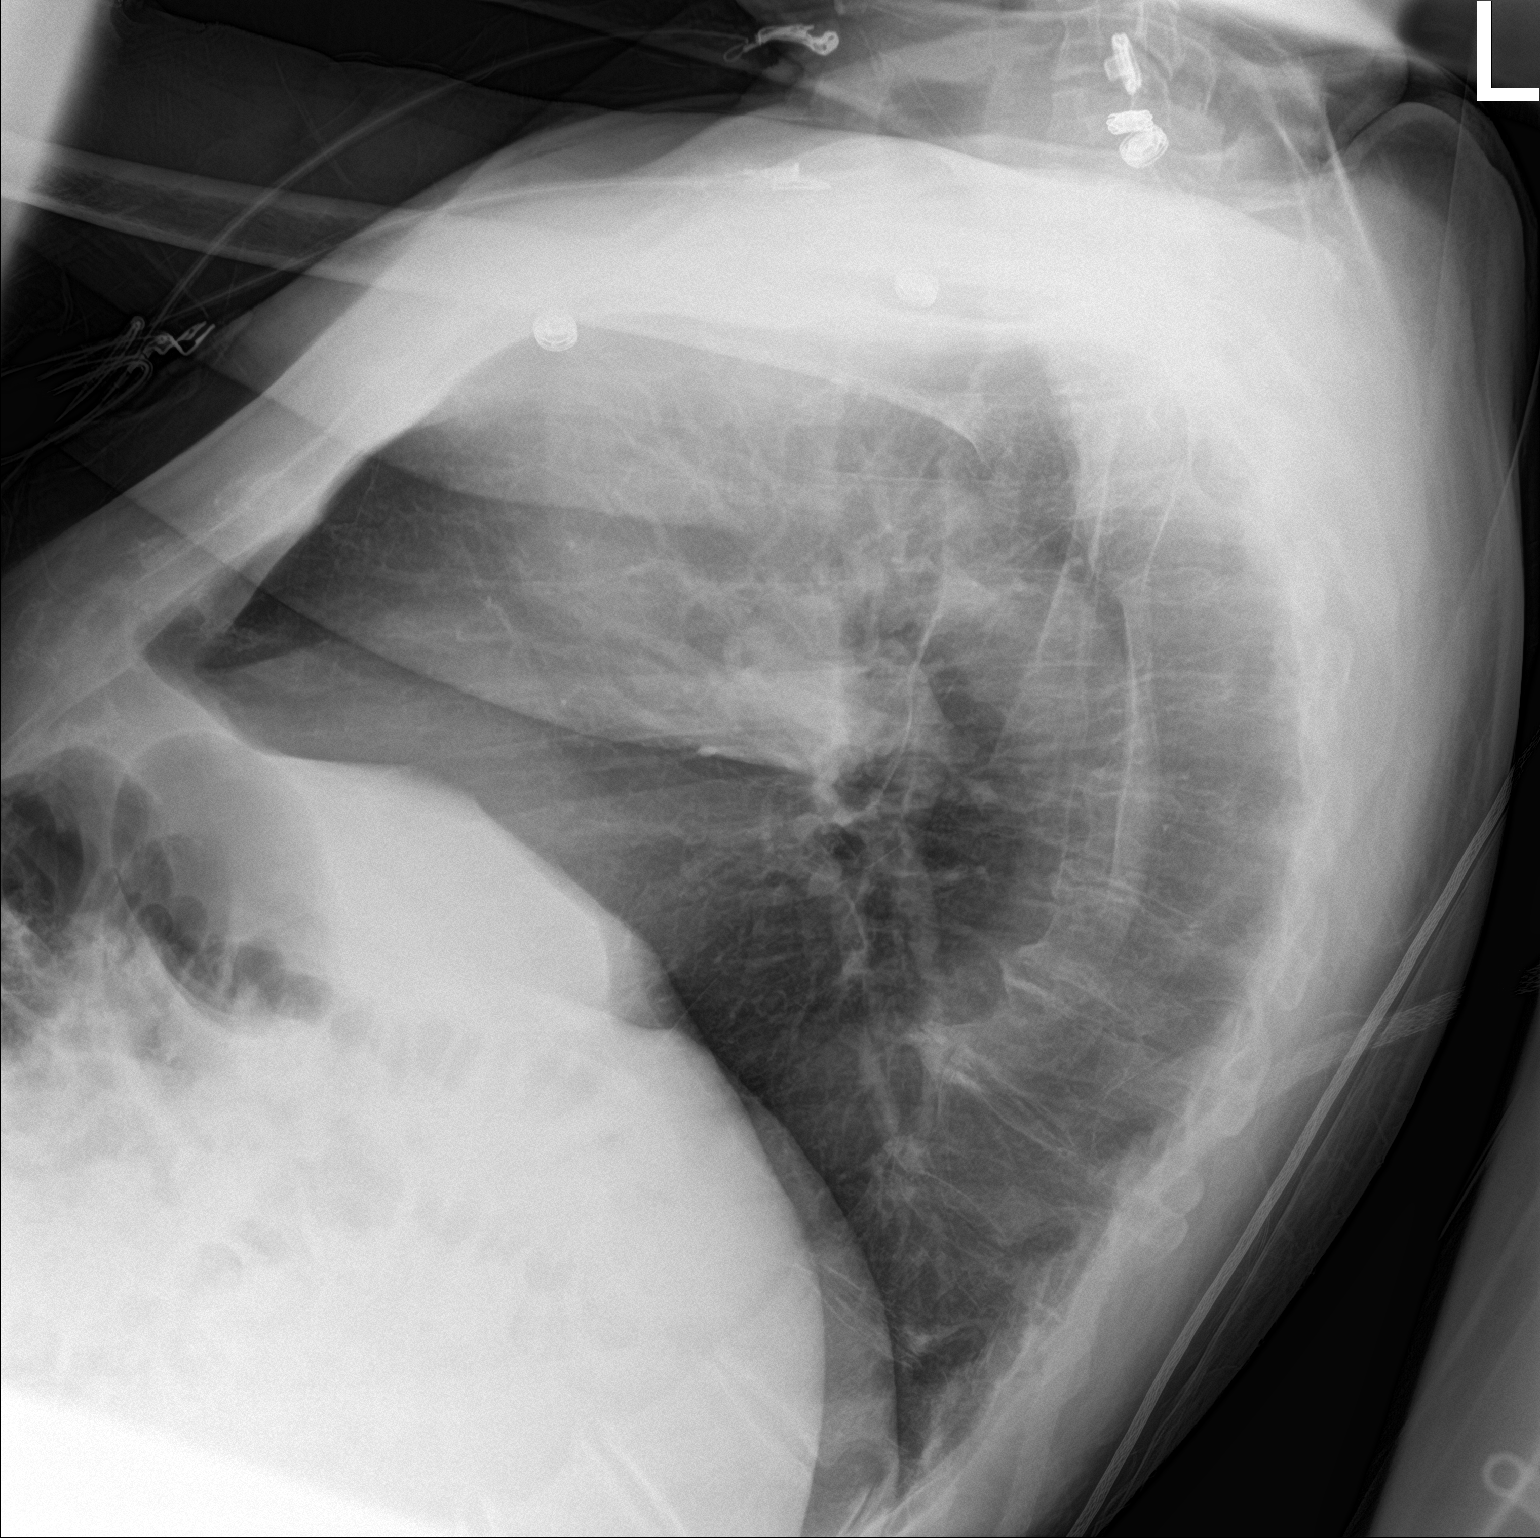

[chest ap]
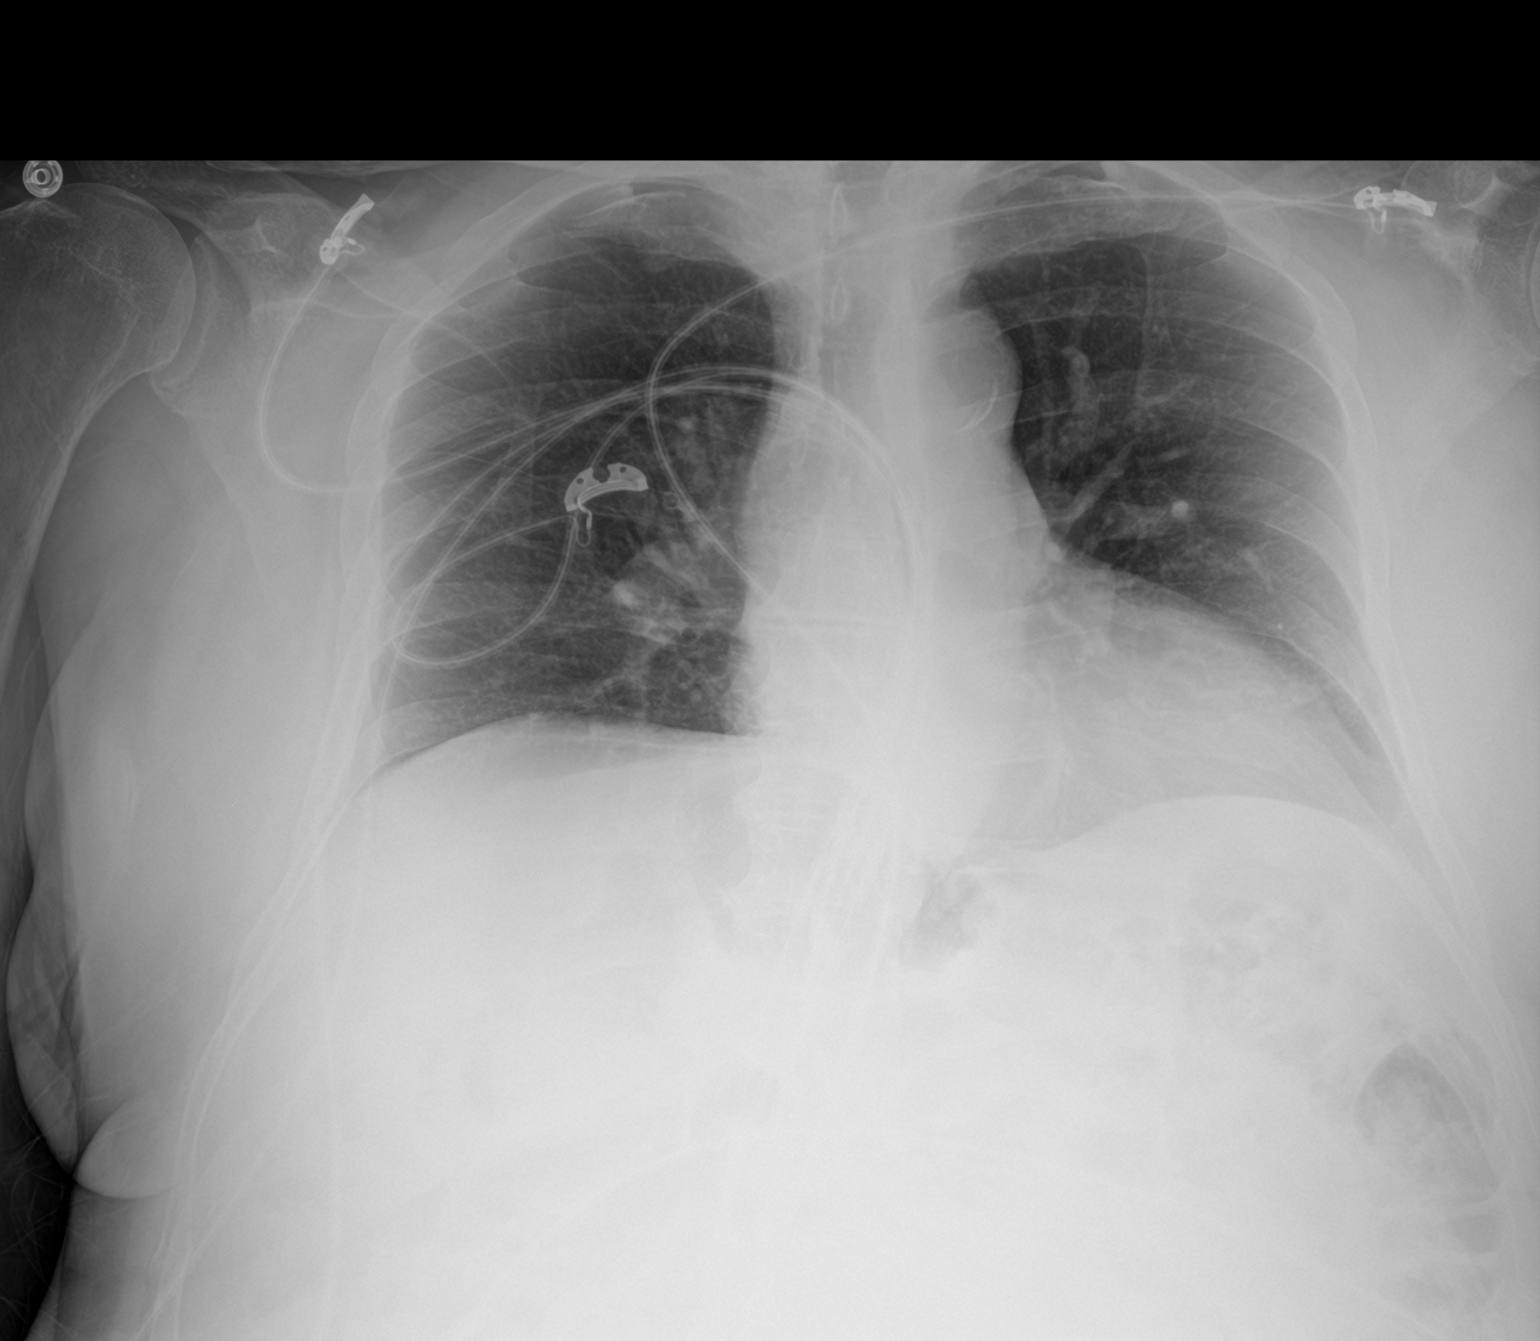

[2 of 2 positions shown; findings below may reference images not displayed]

FINDINGS: Low lung volumes. Vascular crowding with ectatic changes. No
discernible pulmonary nodules or masses. Mild vascular congestion
without frank edema. No consolidative opacity. Prominent cardiac
silhouette though possibly accentuated by technique. The aorta is
calcified. The remaining cardiomediastinal contours are
unremarkable. No acute osseous or soft tissue abnormality.
Degenerative changes are present in the imaged spine and shoulders.
Telemetry leads overlie the chest.
IMPRESSION: Low lung volumes, atelectasis and vascular congestion without frank
edema.

No discerning pulmonary nodules or masses though evaluation limited
on portable radiographic technique.

These results and limitations were discussed by telephone at the
time of interpretation on 12/15/2019 at [DATE] to provider SAIRIO
RONLOR , who verbally acknowledged these results.

## 2022-01-25 DIAGNOSIS — R3912 Poor urinary stream: Secondary | ICD-10-CM | POA: Diagnosis not present

## 2022-05-29 DIAGNOSIS — M79671 Pain in right foot: Secondary | ICD-10-CM | POA: Diagnosis not present

## 2022-05-29 DIAGNOSIS — L97512 Non-pressure chronic ulcer of other part of right foot with fat layer exposed: Secondary | ICD-10-CM | POA: Diagnosis not present

## 2022-05-29 DIAGNOSIS — M2041 Other hammer toe(s) (acquired), right foot: Secondary | ICD-10-CM | POA: Diagnosis not present

## 2022-05-29 DIAGNOSIS — M79672 Pain in left foot: Secondary | ICD-10-CM | POA: Diagnosis not present

## 2022-06-12 DIAGNOSIS — L600 Ingrowing nail: Secondary | ICD-10-CM | POA: Diagnosis not present

## 2022-06-26 DIAGNOSIS — L600 Ingrowing nail: Secondary | ICD-10-CM | POA: Diagnosis not present

## 2022-11-05 ENCOUNTER — Encounter: Payer: Self-pay | Admitting: Emergency Medicine

## 2022-11-05 ENCOUNTER — Ambulatory Visit (INDEPENDENT_AMBULATORY_CARE_PROVIDER_SITE_OTHER): Payer: Medicare Other | Admitting: Emergency Medicine

## 2022-11-05 VITALS — BP 150/80 | HR 101 | Temp 98.2°F | Ht 64.0 in | Wt 203.0 lb

## 2022-11-05 DIAGNOSIS — T161XXA Foreign body in right ear, initial encounter: Secondary | ICD-10-CM

## 2022-11-05 NOTE — Progress Notes (Signed)
Steven Vargas 87 y.o.   Chief Complaint  Patient presents with   Ear Pain    Q-tip cotton ball stuck in right ear    HISTORY OF PRESENT ILLNESS: Acute problem visit today. This is a 87 y.o. male complaining of piece of Q-tip cotton stuck inside right ear for the last several days. States he has a history of rupture tympanic membrane same ear years ago No other complaints or medical concerns today  HPI   Prior to Admission medications   Medication Sig Start Date End Date Taking? Authorizing Provider  finasteride (PROSCAR) 5 MG tablet Take 5 mg by mouth daily.   Yes [provider]  tamsulosin (FLOMAX) 0.4 MG CAPS capsule Take 0.4 mg by mouth.   Yes [provider]  vitamin B-12 (CYANOCOBALAMIN) 500 MCG tablet Take 500 mcg by mouth daily.   Yes [provider]    Allergies  Allergen Reactions   Pravastatin Swelling and Other (See Comments)    Reported jaw and lip swelling along with myalgias in the back with pravastatin.  Symptoms resolved with stopping.     Patient Active Problem List   Diagnosis Date Noted   Acute pain of right knee 03/08/2021   Benign prostatic hyperplasia with lower urinary tract symptoms 09/05/2020   History of gastric ulcer 09/05/2020   History of GI bleed 09/05/2020   Essential hypertension 09/05/2020   Diverticulosis of colon 04/06/2011   Benign neoplasm of colon 04/06/2011   Diverticulosis of colon with hemorrhage 04/06/2011   Diaphragmatic hernia 04/06/2011    Past Medical History:  Diagnosis Date   Gastric ulcer    secondary to nsaid 10 years ago   GI bleed 04/04/2011   Hip pain, chronic    right   HLD (hyperlipidemia)    Hypertension    Obesity (BMI 35.0-39.9 without comorbidity) 03/2011   Stroke (HCC)    2005    Past Surgical History:  Procedure Laterality Date   BIOPSY  12/17/2019   Procedure: BIOPSY;  Surgeon: Charlott Rakes, MD;  Location: [facility] ENDOSCOPY;  Service: Endoscopy;;    ESOPHAGOGASTRODUODENOSCOPY [name] 12/17/2019   Procedure: ESOPHAGOGASTRODUODENOSCOPY (EGD);  Surgeon: Charlott Rakes, MD;  Location: [facility] ENDOSCOPY;  Service: Endoscopy;  Laterality: [name];   TONSILLECTOMY      Social History   Socioeconomic History   Marital status: Married    Spouse name: Not on file   Number of children: Not on file   Years of education: Not on file   Highest education level: Not on file  Occupational History   Not on file  Tobacco Use   Smoking status: Former   Smokeless tobacco: Never  Substance and Sexual Activity   Alcohol use: Yes    Comment: occasional   Drug use: No   Sexual activity: Not Currently  Other Topics Concern   Not on file  Social History Narrative   [place] and [place]. 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 Drinks 2-3 beers once every few weeks. Smoking 2 years ago. Used to smoke 2-3 packs per day before that for past many years. Never did illicit drugs. Has [employer] health care Medicare.   Social Determinants of Health   Financial Resource Strain: Low Risk  (11/16/2021)   Overall Financial Resource Strain (CARDIA)    Difficulty of Paying Living Expenses: Not hard at all  Food Insecurity: No Food Insecurity (11/16/2021)   Hunger Vital Sign    Worried About  Running Out of Food in the Last Year: Never true    Ran Out of Food in the Last Year: Never true  Transportation Needs: No Transportation Needs (11/16/2021)   PRAPARE - Administrator, Civil Service (Medical): No    Lack of Transportation (Non-Medical): No  Physical Activity: Sufficiently Active (11/16/2021)   Exercise Vital Sign    Days of Exercise per Week: 5 days    Minutes of Exercise per Session: 30 min  Stress: No Stress Concern Present (11/16/2021)   [employer] of Occupational Health - Occupational Stress Questionnaire    Feeling of Stress : Not at all  Social Connections: Socially Integrated (11/16/2021)    Social Connection and Isolation Panel [NHANES]    Frequency of Communication with Friends and Family: More than three times a week    Frequency of Social Gatherings with Friends and Family: More than three times a week    Attends Religious Services: More than 4 times per year    Active Member of [employer] or Organizations: Yes    Attends Engineer, structural: More than 4 times per year    Marital Status: Married  Catering manager Violence: Not At Risk (11/16/2021)   Humiliation, Afraid, Rape, and Kick questionnaire    Fear of Current or Ex-Partner: No    Emotionally Abused: No    Physically Abused: No    Sexually Abused: No    Family History  Problem Relation Age of Onset   Stroke Mother    Kidney disease Brother    Diabetes Brother      Review of Systems  Constitutional: Negative.  Negative for chills and fever.  HENT: Negative.  Negative for congestion and sore throat.   Respiratory: Negative.  Negative for cough and shortness of breath.   Cardiovascular: Negative.  Negative for chest pain and palpitations.  Gastrointestinal:  Negative for abdominal pain, nausea and vomiting.  Genitourinary: Negative.  Negative for dysuria.  Skin: Negative.  Negative for rash.  Neurological: Negative.  Negative for dizziness and headaches.  All other systems reviewed and are negative.   Vitals:   11/05/22 1046  BP: (!) 150/80  Pulse: (!) 101  Temp: 98.2 F (36.8 C)  SpO2: 95%    Physical Exam Vitals reviewed.  Constitutional:      Appearance: Normal appearance.  HENT:     Head: Normocephalic.     Left Ear: Tympanic membrane, ear canal and external ear normal.     Ears:     Comments: Positive foreign body deep into canal just next to the eardrum Unable to reach with forceps Eyes:     Extraocular Movements: Extraocular movements intact.  Cardiovascular:     Rate and Rhythm: Normal rate.  Pulmonary:     Effort: Pulmonary effort is normal.  Skin:    General: Skin is  warm and dry.  Neurological:     Mental Status: He is alert and oriented to person, place, and time.  Psychiatric:        Mood and Affect: Mood normal.        Behavior: Behavior normal.      ASSESSMENT & PLAN: Problem List Items Addressed This Visit       Nervous and Auditory   Foreign body of right ear - Primary    Visible foreign body but too deep into the canal. Unable to reach with regular forceps. ENT referral placed today.      Relevant Orders   Ambulatory referral  to ENT   Patient Instructions  Ear Foreign Body An ear foreign body is an object that is stuck in your ear. Do not try to take out an object that is stuck in your ear by yourself. It is important to see a doctor to have the object taken out as soon as you can. What are the causes? It is common for young children to put objects into their ears. These may include food, pebbles, beads, parts of toys, and any other small objects that fit into the ears. In adults, objects like cotton swabs or hearing aid parts can get stuck in the ears. What are the signs or symptoms? An object in the ear may cause: Pain. Buzzing or roaring sounds. Hearing loss. Fluid or blood to come out of the ear. A feeling like you may vomit, or vomiting. A feeling that your ear is stuffed up. How is this treated? Treatment depends on what the object is, where it is in your ear, and whether any part of your ear is injured. If your doctor can see the object in your ear, he or she may take it out by: Using a tool, like medical tweezers or a suction tube. Flushing your ear with water (irrigation). If your doctor cannot see the object or cannot take it out using one of these methods, you may need to see a specialist. Treatment may also include: Antibiotic medicine to prevent infection. These can be pills or ear drops. Cleaning and treating any injuries in your ear. Follow these instructions at home:  Take over-the-counter and prescription  medicines only as told by your doctor. If you were prescribed an antibiotic medicine, such as pills or ear drops, take or use the antibiotic as told by your doctor. Do not stop taking or using it even if you start to feel better. To keep from getting objects stuck in your ear: Do not put anything into your ear. This includes cotton swabs. Talk with your doctor about how to clean your ears safely. Keep small objects out of the reach of young children. Teach children not to put anything into their ears. Keep all follow-up visits. Contact a doctor if: You have a headache. You have a fever. You have pain or swelling that gets worse. You have decreased hearing in your ear. You have ringing in your ear. Get help right away if: You notice blood or fluid coming from your ear. You have sudden hearing loss. Summary An ear foreign body is an object that is stuck in your ear. Do not try to take out an object that is stuck in your ear by yourself. See a doctor as soon as you can. Get help right away if you notice blood or fluid coming from your ear, or you have sudden hearing loss. This information is not intended to replace advice given to you by your health care provider. Make sure you discuss any questions you have with your health care provider. Document Revised: 02/24/2020 Document Reviewed: 02/24/2020 Elsevier Patient Education  2024 Elsevier Inc.    Edwina Barth, MD [facility]

## 2022-11-05 NOTE — Patient Instructions (Signed)
Ear Foreign Body An ear foreign body is an object that is stuck in your ear. Do not try to take out an object that is stuck in your ear by yourself. It is important to see a doctor to have the object taken out as soon as you can. What are the causes? It is common for young children to put objects into their ears. These may include food, pebbles, beads, parts of toys, and any other small objects that fit into the ears. In adults, objects like cotton swabs or hearing aid parts can get stuck in the ears. What are the signs or symptoms? An object in the ear may cause: Pain. Buzzing or roaring sounds. Hearing loss. Fluid or blood to come out of the ear. A feeling like you may vomit, or vomiting. A feeling that your ear is stuffed up. How is this treated? Treatment depends on what the object is, where it is in your ear, and whether any part of your ear is injured. If your doctor can see the object in your ear, he or she may take it out by: Using a tool, like medical tweezers or a suction tube. Flushing your ear with water (irrigation). If your doctor cannot see the object or cannot take it out using one of these methods, you may need to see a specialist. Treatment may also include: Antibiotic medicine to prevent infection. These can be pills or ear drops. Cleaning and treating any injuries in your ear. Follow these instructions at home:  Take over-the-counter and prescription medicines only as told by your doctor. If you were prescribed an antibiotic medicine, such as pills or ear drops, take or use the antibiotic as told by your doctor. Do not stop taking or using it even if you start to feel better. To keep from getting objects stuck in your ear: Do not put anything into your ear. This includes cotton swabs. Talk with your doctor about how to clean your ears safely. Keep small objects out of the reach of young children. Teach children not to put anything into their ears. Keep all follow-up  visits. Contact a doctor if: You have a headache. You have a fever. You have pain or swelling that gets worse. You have decreased hearing in your ear. You have ringing in your ear. Get help right away if: You notice blood or fluid coming from your ear. You have sudden hearing loss. Summary An ear foreign body is an object that is stuck in your ear. Do not try to take out an object that is stuck in your ear by yourself. See a doctor as soon as you can. Get help right away if you notice blood or fluid coming from your ear, or you have sudden hearing loss. This information is not intended to replace advice given to you by your health care provider. Make sure you discuss any questions you have with your health care provider. Document Revised: 02/24/2020 Document Reviewed: 02/24/2020 Elsevier Patient Education  2024 [employer].

## 2022-11-05 NOTE — Assessment & Plan Note (Signed)
Visible foreign body but too deep into the canal. Unable to reach with regular forceps. ENT referral placed today.

## 2022-11-07 DIAGNOSIS — T161XXA Foreign body in right ear, initial encounter: Secondary | ICD-10-CM | POA: Diagnosis not present

## 2022-11-19 ENCOUNTER — Ambulatory Visit (INDEPENDENT_AMBULATORY_CARE_PROVIDER_SITE_OTHER): Payer: Medicare Other

## 2022-11-19 VITALS — Ht 64.0 in | Wt 203.0 lb

## 2022-11-19 DIAGNOSIS — Z0000 Encounter for general adult medical examination without abnormal findings: Secondary | ICD-10-CM | POA: Diagnosis not present

## 2022-11-19 NOTE — Patient Instructions (Addendum)
Steven Vargas , Thank you for taking time to come for your Medicare Wellness Visit. I appreciate your ongoing commitment to your health goals. Please review the following plan we discussed and let me know if I can assist you in the future.   Referrals/Orders/Follow-Ups/Clinician Recommendations: No  This is a list of the screening recommended for you and due dates:  Health Maintenance  Topic Date Due   DTaP/Tdap/Td vaccine (1 - Tdap) Never done   Zoster (Shingles) Vaccine (1 of 2) Never done   Flu Shot  08/30/2022   COVID-19 Vaccine (3 - 2023-24 season) 09/30/2022   Medicare Annual Wellness Visit  11/19/2023   Pneumonia Vaccine  Completed   HPV Vaccine  Aged Out   Conditions/risks identified: Yes; Reviewed health maintenance screenings with patient today and relevant education, vaccines, and/or referrals were provided. Please continue to do your personal lifestyle choices by: daily care of teeth and gums, regular physical activity (goal should be 5 days a week for 30 minutes), eat a healthy diet, avoid tobacco and drug use, limiting any alcohol intake, taking a low-dose aspirin (if not allergic or have been advised by your provider otherwise) and taking vitamins and minerals as recommended by your provider. Continue doing brain stimulating activities (puzzles, reading, adult coloring books, staying active) to keep memory sharp. Continue to eat heart healthy diet (full of fruits, vegetables, whole grains, lean protein, water--limit salt, fat, and sugar intake) and increase physical activity as tolerated.  Advanced directives: (Declined) Advance directive discussed with you today. Even though you declined this today, please call our office should you change your mind, and we can give you the proper paperwork for you to fill out.  Next Medicare Annual Wellness Visit scheduled for next year: Yes

## 2022-11-19 NOTE — Progress Notes (Signed)
Subjective:   Steven Vargas is a 87 y.o. male who presents for Medicare Annual/Subsequent preventive examination.  Visit Complete: Virtual I connected with  Steven Vargas on 11/19/22 by a audio enabled telemedicine application and verified that I am speaking with the correct person using two identifiers.  Patient Location: Home  Provider Location: Office/Clinic  I discussed the limitations of evaluation and management by telemedicine. The patient expressed understanding and agreed to proceed.  Vital Signs: Because this visit was a virtual/telehealth visit, some criteria may be missing or patient reported. Any vitals not documented were not able to be obtained and vitals that have been documented are patient reported.  Cardiac Risk Factors include: advanced age (>37men, >45 women);dyslipidemia;family history of premature cardiovascular disease;hypertension;male gender;obesity (BMI >30kg/m2)     Objective:    Today's Vitals   11/19/22 1116  Weight: 203 lb (92.1 kg)  Height: 5\' 4"  (1.626 m)  PainSc: 0-No pain   Body mass index is 34.84 kg/m.     11/19/2022   11:19 AM 11/16/2021   10:50 AM 11/14/2020    9:41 AM 12/17/2019    9:10 AM 12/16/2019    4:54 PM 12/15/2019    7:47 PM 10/18/2016   11:17 AM  Advanced Directives  Does Patient Have a Medical Advance Directive? No No No No  No No  Would patient like information on creating a medical advance directive? No - Patient declined No - Patient declined No - Patient declined No - Patient declined No - Patient declined  No - Patient declined    Current Medications (verified) Outpatient Encounter Medications as of 11/19/2022  Medication Sig   finasteride (PROSCAR) 5 MG tablet Take 5 mg by mouth daily.   tamsulosin (FLOMAX) 0.4 MG CAPS capsule Take 0.4 mg by mouth.   vitamin B-12 (CYANOCOBALAMIN) 500 MCG tablet Take 500 mcg by mouth daily.   No facility-administered encounter medications on file as of 11/19/2022.    Allergies  (verified) Pravastatin   History: Past Medical History:  Diagnosis Date   Gastric ulcer    secondary to nsaid 10 years ago   GI bleed 04/04/2011   Hip pain, chronic    right   HLD (hyperlipidemia)    Hypertension    Obesity (BMI 35.0-39.9 without comorbidity) 03/2011   Stroke [facility])    2005   Past Surgical History:  Procedure Laterality Date   BIOPSY  12/17/2019   Procedure: BIOPSY;  Surgeon: Charlott Rakes, MD;  Location: [facility] ENDOSCOPY;  Service: Endoscopy;;   ESOPHAGOGASTRODUODENOSCOPY [name] 12/17/2019   Procedure: ESOPHAGOGASTRODUODENOSCOPY (EGD);  Surgeon: Charlott Rakes, MD;  Location: [facility] ENDOSCOPY;  Service: Endoscopy;  Laterality: [name];   TONSILLECTOMY     Family History  Problem Relation Age of Onset   Stroke Mother    Kidney disease Brother    Diabetes Brother    Social History   Socioeconomic History   Marital status: Married    Spouse name: Not on file   Number of children: Not on file   Years of education: Not on file   Highest education level: Not on file  Occupational History   Not on file  Tobacco Use   Smoking status: Former   Smokeless tobacco: Never  Substance and Sexual Activity   Alcohol use: Yes    Comment: occasional   Drug use: No   Sexual activity: Not Currently  Other Topics Concern   Not on file  Social History Narrative   [place]  wife and son. 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 Drinks 2-3 beers once every few weeks. Smoking 2 years ago. Used to smoke 2-3 packs per day before that for past many years. Never did illicit drugs. Has United health care Medicare.   Social Determinants of Health   Financial Resource Strain: Low Risk  (11/19/2022)   Overall Financial Resource Strain (CARDIA)    Difficulty of Paying Living Expenses: Not hard at all  Food Insecurity: No Food Insecurity (11/19/2022)   Hunger Vital Sign    Worried About Running Out of Food in the Last Year: Never true     Ran Out of Food in the Last Year: Never true  Transportation Needs: No Transportation Needs (11/19/2022)   PRAPARE - Administrator, Civil Service (Medical): No    Lack of Transportation (Non-Medical): No  Physical Activity: Sufficiently Active (11/19/2022)   Exercise Vital Sign    Days of Exercise per Week: 5 days    Minutes of Exercise per Session: 30 min  Stress: No Stress Concern Present (11/19/2022)   [employer] of Occupational Health - Occupational Stress Questionnaire    Feeling of Stress : Not at all  Social Connections: Socially Integrated (11/19/2022)   Social Connection and Isolation Panel [NHANES]    Frequency of Communication with Friends and Family: More than three times a week    Frequency of Social Gatherings with Friends and Family: More than three times a week    Attends Religious Services: More than 4 times per year    Active Member of [employer] or Organizations: Yes    Attends Engineer, structural: More than 4 times per year    Marital Status: Married    Tobacco Counseling Counseling given: Not Answered   Clinical Intake:  Pre-visit preparation completed: Yes  Pain : No/denies pain Pain Score: 0-No pain     BMI - recorded: 34.84 Nutritional Status: BMI > 30  Obese Nutritional Risks: None Diabetes: No  How often do you need to have someone help you when you read instructions, pamphlets, or other written materials from your doctor or pharmacy?: 1 - Never What is the last grade level you completed in school?: management  Interpreter Needed?: No  Information entered by :: Thresa Dozier N. Breland Elders, LPN.   Activities of Daily Living    11/19/2022   11:25 AM  In your present state of health, do you have any difficulty performing the following activities:  Hearing? 0  Vision? 0  Difficulty concentrating or making decisions? 0  Walking or climbing stairs? 0  Dressing or bathing? 0  Doing errands, shopping? 0  Preparing Food and  eating ? N  Using the Toilet? N  In the past six months, have you accidently leaked urine? N  Do you have problems with loss of bowel control? N  Managing your Medications? N  Managing your Finances? N  Housekeeping or managing your Housekeeping? N    Patient Care Team: Georgina Quint, MD as PCP - General (Internal Medicine) Jerilee Field, MD as Consulting Physician (Urology) Scarlette Ar, MD as Consulting Physician (Otolaryngology) Merwyn Katos, DPM as Consulting Physician (Podiatry)  Indicate any recent Medical Services you may have received from other than [facility] providers in the past year (date may be approximate).     Assessment:   This is a routine wellness examination for [place].  Hearing/Vision screen Hearing Screening - Comments:: Patient denied any hearing difficulty.   No hearing aids.  Vision Screening - Comments:: Patient does wear corrective lenses/contacts.  Annual eye exam done by: [facility] (02/07/2023)     Goals Addressed             This Visit's Progress    My healthcare goal is to continue to feel like I am 87 years old.        Depression Screen    11/19/2022   11:28 AM 11/16/2021   10:53 AM 03/08/2021   10:01 AM 11/14/2020    9:40 AM 09/05/2020    1:36 PM 10/21/2019   10:25 AM 04/20/2019   10:39 AM  PHQ 2/9 Scores  PHQ - 2 Score 0 0 0 0 0 0 0  PHQ- 9 Score 0          Fall Risk    11/19/2022   11:19 AM 11/16/2021   10:51 AM 11/14/2020    9:44 AM 09/05/2020    1:06 PM 02/15/2020   12:17 PM  Fall Risk   Falls in the past year? 0 0 0 0 0  Number falls in past yr: 0 0 0 0   Injury with Fall? 0 0 0 0   Risk for fall due to : No Fall Risks No Fall Risks No Fall Risks    Follow up Falls prevention discussed Falls prevention discussed Falls evaluation completed  Falls evaluation completed    MEDICARE RISK AT HOME: Medicare Risk at Home Any stairs in or around the home?: No If so, are there any without handrails?: No Home free  of loose throw rugs in walkways, pet beds, electrical cords, etc?: Yes Adequate lighting in your home to reduce risk of falls?: Yes Life alert?: No Use of a cane, walker or w/c?: No Grab bars in the bathroom?: Yes Shower chair or bench in shower?: Yes Elevated toilet seat or a handicapped toilet?: No  TIMED UP AND GO:  Was the test performed?  No    Cognitive Function: Normal cognitive status assessed by direct observation via telephone conversation by this Nurse Health Advisor. No abnormalities found.    11/19/2022   11:23 AM  MMSE - Mini Mental State Exam  Not completed: Unable to complete        11/19/2022   11:23 AM 11/16/2021   10:57 AM  6CIT Screen  What Year? 0 points 0 points  What month? 0 points 0 points  What time? 0 points 0 points  Count back from 20 0 points 0 points  Months in reverse 0 points 0 points  Repeat phrase 0 points 0 points  Total Score 0 points 0 points    Immunizations Immunization History  Administered Date(s) Administered   Fluad Quad(high Dose 65+) 10/20/2018   Influenza, High Dose Seasonal PF 10/17/2017   Influenza,inj,Quad PF,6+ Mos 10/18/2016   PFIZER(Purple Top)SARS-COV-2 Vaccination 05/29/2019, 06/22/2019   Pneumococcal Conjugate-13 04/12/2016   Pneumococcal Polysaccharide-23 10/17/2017    TDAP status: Due, Education has been provided regarding the importance of this vaccine. Advised may receive this vaccine at local pharmacy or Health Dept. Aware to provide a copy of the vaccination record if obtained from local pharmacy or Health Dept. Verbalized acceptance and understanding.  Flu Vaccine status: Due, Education has been provided regarding the importance of this vaccine. Advised may receive this vaccine at local pharmacy or Health Dept. Aware to provide a copy of the vaccination record if obtained from local pharmacy or Health Dept. Verbalized acceptance and understanding.  Pneumococcal vaccine status: Up to date  Covid-19  vaccine status: Completed vaccines  Qualifies for Shingles Vaccine? Yes   Zostavax completed No   Shingrix Completed?: No.    Education has been provided regarding the importance of this vaccine. Patient has been advised to call insurance company to determine out of pocket expense if they have not yet received this vaccine. Advised may also receive vaccine at local pharmacy or Health Dept. Verbalized acceptance and understanding.  Screening Tests Health Maintenance  Topic Date Due   DTaP/Tdap/Td (1 - Tdap) Never done   Zoster Vaccines- Shingrix (1 of 2) Never done   INFLUENZA VACCINE  08/30/2022   COVID-19 Vaccine (3 - 2023-24 season) 09/30/2022   Medicare Annual Wellness (AWV)  11/19/2023   Pneumonia Vaccine 31+ Years old  Completed   HPV VACCINES  Aged Out    Health Maintenance  Health Maintenance Due  Topic Date Due   DTaP/Tdap/Td (1 - Tdap) Never done   Zoster Vaccines- Shingrix (1 of 2) Never done   INFLUENZA VACCINE  08/30/2022   COVID-19 Vaccine (3 - 2023-24 season) 09/30/2022    Colorectal cancer screening: No longer required.   Lung Cancer Screening: (Low Dose CT Chest recommended if Age 86-80 years, 20 pack-year currently smoking OR have quit w/in 15years.) does not qualify.   Lung Cancer Screening Referral: no  Additional Screening:  Hepatitis C Screening: does not qualify.  Vision Screening: Recommended annual ophthalmology exams for early detection of glaucoma and other disorders of the eye. Is the patient up to date with their annual eye exam?  Yes  Who is the provider or what is the name of the office in which the patient attends annual eye exams? Patient is scheduled to see [facility] in January 2025. If pt is not established with a provider, would they like to be referred to a provider to establish care? No .   Dental Screening: Recommended annual dental exams for proper oral hygiene  Diabetic Foot Exam: [name]  Community Resource Referral / Chronic  Care Management: CRR required this visit?  No   CCM required this visit?  No     Plan:     I have personally reviewed and noted the following in the patient's chart:   Medical and social history Use of alcohol, tobacco or illicit drugs  Current medications and supplements including opioid prescriptions. Patient is not currently taking opioid prescriptions. Functional ability and status Nutritional status Physical activity Advanced directives List of other physicians Hospitalizations, surgeries, and ER visits in previous 12 months Vitals Screenings to include cognitive, depression, and falls Referrals and appointments  In addition, I have reviewed and discussed with patient certain preventive protocols, quality metrics, and best practice recommendations. A written personalized care plan for preventive services as well as general preventive health recommendations were provided to patient.     Mickeal Needy, LPN   16/10/9602   After Visit Summary: (MyChart) Due to this being a telephonic visit, the after visit summary with patients personalized plan was offered to patient via MyChart   Nurse Notes: None

## 2023-02-07 DIAGNOSIS — H25813 Combined forms of age-related cataract, bilateral: Secondary | ICD-10-CM | POA: Diagnosis not present

## 2023-02-07 DIAGNOSIS — H04123 Dry eye syndrome of bilateral lacrimal glands: Secondary | ICD-10-CM | POA: Diagnosis not present

## 2023-02-07 DIAGNOSIS — H34831 Tributary (branch) retinal vein occlusion, right eye, with macular edema: Secondary | ICD-10-CM | POA: Diagnosis not present

## 2023-02-07 DIAGNOSIS — H4321 Crystalline deposits in vitreous body, right eye: Secondary | ICD-10-CM | POA: Diagnosis not present

## 2023-02-07 DIAGNOSIS — H35033 Hypertensive retinopathy, bilateral: Secondary | ICD-10-CM | POA: Diagnosis not present

## 2023-02-07 DIAGNOSIS — H02402 Unspecified ptosis of left eyelid: Secondary | ICD-10-CM | POA: Diagnosis not present

## 2023-02-20 ENCOUNTER — Encounter (INDEPENDENT_AMBULATORY_CARE_PROVIDER_SITE_OTHER): Payer: Medicare Other | Admitting: Ophthalmology

## 2023-02-20 DIAGNOSIS — H3581 Retinal edema: Secondary | ICD-10-CM

## 2023-03-13 ENCOUNTER — Encounter (INDEPENDENT_AMBULATORY_CARE_PROVIDER_SITE_OTHER): Payer: Medicare Other | Admitting: Ophthalmology

## 2023-03-13 DIAGNOSIS — H3581 Retinal edema: Secondary | ICD-10-CM

## 2023-04-04 NOTE — Progress Notes (Signed)
 [facility] - Clinic Note  04/17/2023   CHIEF COMPLAINT Patient presents for Retina Evaluation  HISTORY OF PRESENT ILLNESS: Steven Vargas is a 88 y.o. male who presents to the clinic today for:  HPI     Retina Evaluation   In both eyes.  This started 2 months ago.  Duration of 2 months.  Context:  distance vision, mid-range vision and near vision.  I, the attending physician,  performed the HPI with the patient and updated documentation appropriately.        Comments   Retina eval per Dr Dione Booze BRVO pt is reporting that his vision has been cloudy in the left eye he denies any flashes or floaters pt states he is pre diabetic and is not checking his blood sugar at this time       Last edited by Rennis Chris, MD on 04/17/2023  6:43 PM.    Pt is here on the referral of [facility] for concern of BRVO OS, pt saw them in August and had 2 prior appts, but rescheduled them to today, pt states the vision is his left eye looks like it has a film over it, he states about a year ago, he had pain in his eye as well, he states he tried to see Dr. Dione Booze last year, but was told they weren't taking new pts at that time, pt feels like his vision is better than it was a year ago   Referring physician: Alma Downs, PA-C  [address] [address] [place],  [place] [address]  HISTORICAL INFORMATION:  Selected notes from the MEDICAL RECORD NUMBER Referred by Alma Downs, PA-C for BRVO OS - originally referred in Jan 2025 -- delayed presentation to 03.19.25 LEE:  Ocular Hx- PMH-   CURRENT MEDICATIONS: No current outpatient medications on file. (Ophthalmic Drugs)   No current facility-administered medications for this visit. (Ophthalmic Drugs)   Current Outpatient Medications (Other)  Medication Sig   finasteride (PROSCAR) 5 MG tablet Take 5 mg by mouth daily.   tamsulosin (FLOMAX) 0.4 MG CAPS capsule Take 0.4 mg by mouth.   vitamin B-12  (CYANOCOBALAMIN) 500 MCG tablet Take 500 mcg by mouth daily.   No current facility-administered medications for this visit. (Other)   REVIEW OF SYSTEMS: ROS   Positive for: Neurological, Musculoskeletal, Cardiovascular, Eyes Negative for: Constitutional, Gastrointestinal, Skin, Genitourinary, HENT, Endocrine, Respiratory, Psychiatric, Allergic/Imm, Heme/Lymph Last edited by Doreene Nest, COT on 04/17/2023  9:31 AM.     ALLERGIES Allergies  Allergen Reactions   Pravastatin Swelling and Other (See Comments)    Reported jaw and lip swelling along with myalgias in the back with pravastatin.  Symptoms resolved with stopping.    PAST MEDICAL HISTORY Past Medical History:  Diagnosis Date   Gastric ulcer    secondary to nsaid 10 years ago   GI bleed 04/04/2011   Hip pain, chronic    right   HLD (hyperlipidemia)    Hypertension    Obesity (BMI 35.0-39.9 without comorbidity) 03/2011   Stroke [facility])    2005   Past Surgical History:  Procedure Laterality Date   BIOPSY  12/17/2019   Procedure: BIOPSY;  Surgeon: Charlott Rakes, MD;  Location: [facility] ENDOSCOPY;  Service: Endoscopy;;   ESOPHAGOGASTRODUODENOSCOPY [name] 12/17/2019   Procedure: ESOPHAGOGASTRODUODENOSCOPY (EGD);  Surgeon: Charlott Rakes, MD;  Location: [facility] ENDOSCOPY;  Service: Endoscopy;  Laterality: [name];   TONSILLECTOMY     FAMILY HISTORY Family History  Problem Relation Age of Onset   Stroke Mother    Kidney disease Brother    Diabetes Brother    SOCIAL HISTORY Social History   Tobacco Use   Smoking status: Former   Smokeless tobacco: Never  Substance Use Topics   Alcohol use: Yes    Comment: occasional   Drug use: No       OPHTHALMIC EXAM:  Base Eye Exam     Visual Acuity (Snellen - Linear)       Right Left   Dist [place] 20/25 -2 CF at 3'   Dist ph  NI NI         Tonometry (Tonopen, 9:04 AM)       Right Left   Pressure 12 13         Pupils       Dark Light Shape React APD   Right 3 2  Round Brisk None   Left 3 2 Round Brisk None         Visual Fields       Left Right    Full Full         Extraocular Movement       Right Left    Full, Ortho Full, Ortho         Neuro/Psych     Oriented x3: Yes   Mood/Affect: Normal         Dilation     Both eyes: 2.5% Phenylephrine @ 9:04 AM           Slit Lamp and Fundus Exam     Slit Lamp Exam       Right Left   Lids/Lashes Dermatochalasis - upper lid, Meibomian gland dysfunction Dermatochalasis - upper lid, Meibomian gland dysfunction   Conjunctiva/Sclera White and quiet 1+ Injection   Cornea trace PEE trace PEE, arcus, mild tear film debris   Anterior Chamber deep, clear, narrow temporal angle deep, clear, narrow temporal angle   Iris Round and dilated Round and dilated   Lens 3+ Nuclear sclerosis with +brunescence, 3+ Cortical cataract 3+ Nuclear sclerosis with +brunescence, 3+ Cortical cataract   Anterior Vitreous mild syneresis mild syneresis         Fundus Exam       Right Left   Disc mild Pallor, Sharp rim, +cupping Central pallor, +cupping, Sharp rim, vascular loops, no heme   C/D Ratio 0.8 0.7   Macula Flat, Blunted foveal reflex, RPE mottling and clumping, No heme or edema Blunted foveal reflex, scattered DBH, +edema greatest temporal mac, RPE mottling and clumping   Vessels attenuated, mild tortuosity attenuated, Tortuous, superior RVO, Telangiectasia   Periphery Attached, No heme Attached, scattered DBH superior hemisphere           Refraction     Manifest Refraction   Unable to correct os           IMAGING AND PROCEDURES  Imaging and Procedures for 04/17/2023  OCT, Retina - OU - Both Eyes       Right Eye Quality was good. Central Foveal Thickness: 248. Progression has no prior data. Findings include normal foveal contour, no IRF, no SRF, retinal drusen (Partial PVD).   Left Eye Quality was good. Central Foveal Thickness: 182. Progression has no prior data.  Findings include no SRF, abnormal foveal contour, intraretinal hyper-reflective material, intraretinal fluid, outer retinal atrophy (Central ORA and diffuse atrophy with focal edema greatest temporal macla).   Notes *Images captured and stored on drive  Diagnosis /  Impression:  OD: NFP, no IRF/SRF OS: Central ORA and diffuse atrophy with focal edema greatest temporal macula -- BRVO  Clinical management:  See below  Abbreviations: NFP - Normal foveal profile. CME - cystoid macular edema. PED - pigment epithelial detachment. IRF - intraretinal fluid. SRF - subretinal fluid. EZ - ellipsoid zone. ERM - epiretinal membrane. ORA - outer retinal atrophy. ORT - outer retinal tubulation. SRHM - subretinal hyper-reflective material. IRHM - intraretinal hyper-reflective material      Fluorescein Angiography Optos (Transit OS)       Right Eye Progression has no prior data. Early phase findings include staining. Mid/Late phase findings include staining (Mild peripheral staining).   Left Eye Progression has no prior data. Early phase findings include delayed filling, vascular perfusion defect. Mid/Late phase findings include leakage, vascular perfusion defect (Superior vascular perfusion defect, hyperfluorescence of the disc, perivascular leakage superior hemisphere -- superior RVO ).   Notes **Images stored on drive**  Impression: OD: Mild peripheral staining OS: Superior vascular perfusion defect, hyperfluorescence of the disc, perivascular leakage superior hemisphere --  superior RVO       Intravitreal Injection, Pharmacologic Agent - OS - Left Eye       Time Out 04/17/2023. 10:35 AM. Confirmed correct patient, procedure, site, and patient consented.   Anesthesia Topical anesthesia was used. Anesthetic medications included Lidocaine 2%, Proparacaine 0.5%.   Procedure Preparation included 5% betadine to ocular surface, eyelid speculum. A supplied needle was used.   Injection: 1.25  mg Bevacizumab 1.25mg /0.25ml   Route: Intravitreal, Site: Left Eye   NDC: 13244-010-27, Lot: [id number] , Expiration date: 05/06/2023   Post-op Post injection exam found visual acuity of at least counting fingers. The patient tolerated the procedure well. There were no complications. The patient received written and verbal post procedure care education.           ASSESSMENT/PLAN:   ICD-10-CM   1. Branch retinal vein occlusion of left eye with macular edema  H34.8320 OCT, Retina - OU - Both Eyes    Fluorescein Angiography Optos (Transit OS)    Intravitreal Injection, Pharmacologic Agent - OS - Left Eye    Bevacizumab (AVASTIN) SOLN 1.25 mg    2. Essential hypertension  I10     3. Hypertensive retinopathy of both eyes  H35.033     4. Combined forms of age-related cataract of both eyes  H25.813      BRVO w/ CME OS - The natural history of retinal vein occlusion and macular edema and treatment options including observation, laser photocoagulation, and intravitreal antiVEGF injection with Avastin, Lucentis, Eylea, Vabysmo and intravitreal injection of steroids with triamcinolone and Ozurdex and the complications of these procedures including loss of vision, infection, cataract, glaucoma, and retinal detachment were discussed with patient. - Specifically discussed patient stabilization with anti-VEGF agents and increased potential for visual improvements.  Also discussed need for frequent follow up and potentially multiple injections given the chronic nature of the disease process - BCVA OS CF at 3' (was 20/400 in Jan 2025 at [facility]) - OCT shows central ORA and diffuse atrophy with focal edema greatest temporal macla -- likely BRAO component causing atrophy - recommend IVA OS #1 today, 03.19.25 for BRVO w/ CME - pt wishes to proceed with injection - RBA of procedure discussed, questions answered - IVA informed consent obtained and signed, 03.19.25 (OS) - see procedure note - F/U 4  weeks -- DFE/OCT/possible injection  2,3. Hypertensive retinopathy OU - discussed importance of tight BP  control - monitor  4. Mixed Cataract OU - The symptoms of cataract, surgical options, and treatments and risks were discussed with patient. - discussed diagnosis and progression   Ophthalmic Meds Ordered this visit:  Meds ordered this encounter  Medications   Bevacizumab (AVASTIN) SOLN 1.25 mg     Return in about 4 weeks (around 05/15/2023) for f/u BRVO OS, DFE, OCT, Possible Injxn.  There are no Patient Instructions on file for this visit.  Explained the diagnoses, plan, and follow up with the patient and they expressed understanding.  Patient expressed understanding of the importance of proper follow up care.   This document serves as a record of services personally performed by Karie Chimera, MD, PhD. It was created on their behalf by Glee Arvin. Manson Passey, OA an ophthalmic technician. The creation of this record is the provider's dictation and/or activities during the visit.    Electronically signed by: Glee Arvin. Manson Passey, OA 04/17/23 6:44 PM  Karie Chimera, M.D., Ph.D. Diseases & Surgery of the Retina and Vitreous [facility] 04/17/2023  I have reviewed the above documentation for accuracy and completeness, and I agree with the above. Karie Chimera, M.D., Ph.D. 04/17/23 6:47 PM   Abbreviations: M myopia (nearsighted); A astigmatism; H hyperopia (farsighted); P presbyopia; Mrx spectacle prescription;  CTL contact lenses; OD right eye; OS left eye; OU both eyes  XT exotropia; ET esotropia; PEK punctate epithelial keratitis; PEE punctate epithelial erosions; DES dry eye syndrome; MGD meibomian gland dysfunction; ATs artificial tears; PFAT's preservative free artificial tears; NSC nuclear sclerotic cataract; PSC posterior subcapsular cataract; ERM epi-retinal membrane; PVD posterior vitreous detachment; RD retinal detachment; DM diabetes mellitus; DR diabetic  retinopathy; NPDR non-proliferative diabetic retinopathy; PDR proliferative diabetic retinopathy; CSME clinically significant macular edema; DME diabetic macular edema; dbh dot blot hemorrhages; CWS cotton wool spot; POAG primary open angle glaucoma; C/D cup-to-disc ratio; HVF humphrey visual field; GVF goldmann visual field; OCT optical coherence tomography; IOP intraocular pressure; BRVO Branch retinal vein occlusion; CRVO central retinal vein occlusion; CRAO central retinal artery occlusion; BRAO branch retinal artery occlusion; RT retinal tear; SB scleral buckle; PPV pars plana vitrectomy; VH Vitreous hemorrhage; PRP panretinal laser photocoagulation; IVK intravitreal kenalog; VMT vitreomacular traction; MH Macular hole;  NVD neovascularization of the disc; NVE neovascularization elsewhere; AREDS age related eye disease study; ARMD age related macular degeneration; POAG primary open angle glaucoma; EBMD epithelial/anterior basement membrane dystrophy; ACIOL anterior chamber intraocular lens; IOL intraocular lens; PCIOL posterior chamber intraocular lens; Phaco/IOL phacoemulsification with intraocular lens placement; PRK photorefractive keratectomy; LASIK laser assisted in situ keratomileusis; HTN hypertension; DM diabetes mellitus; COPD chronic obstructive pulmonary disease

## 2023-04-17 ENCOUNTER — Encounter (INDEPENDENT_AMBULATORY_CARE_PROVIDER_SITE_OTHER): Payer: Self-pay | Admitting: Ophthalmology

## 2023-04-17 ENCOUNTER — Ambulatory Visit (INDEPENDENT_AMBULATORY_CARE_PROVIDER_SITE_OTHER): Payer: Medicare Other | Admitting: Ophthalmology

## 2023-04-17 VITALS — BP 169/102 | HR 114

## 2023-04-17 DIAGNOSIS — H35033 Hypertensive retinopathy, bilateral: Secondary | ICD-10-CM

## 2023-04-17 DIAGNOSIS — I10 Essential (primary) hypertension: Secondary | ICD-10-CM

## 2023-04-17 DIAGNOSIS — H34832 Tributary (branch) retinal vein occlusion, left eye, with macular edema: Secondary | ICD-10-CM | POA: Diagnosis not present

## 2023-04-17 DIAGNOSIS — H25813 Combined forms of age-related cataract, bilateral: Secondary | ICD-10-CM

## 2023-04-17 DIAGNOSIS — H3581 Retinal edema: Secondary | ICD-10-CM

## 2023-04-17 MED ORDER — BEVACIZUMAB CHEMO INJECTION 1.25MG/0.05ML SYRINGE FOR KALEIDOSCOPE
1.2500 mg | INTRAVITREAL | Status: AC | PRN
  Administered 2023-04-17: 1.25 mg via INTRAVITREAL

## 2023-05-14 NOTE — Progress Notes (Signed)
 [facility] - Clinic Note  05/15/2023   CHIEF COMPLAINT Patient presents for Retina Follow Up  HISTORY OF PRESENT ILLNESS: Steven Vargas is a 88 y.o. male who presents to the clinic today for:  HPI     Retina Follow Up   Patient presents with  CRVO/BRVO.  In left eye.  This started 4 weeks ago.  Severity is moderate.  Duration of 4 weeks.  I, the attending physician,  performed the HPI with the patient and updated documentation appropriately.        Comments   Pt presents for 4 week retina follow up, BRVO OS/IVA OS. Pt states he has noticed some improvement in vision. Pt denies any flashes/floaters. Pt had a small little twitch in his left eye last night, minor discomfort.  Pt states he is pre diabetic and is not checking his blood sugar at this time.      Last edited by Ronelle Coffee, MD on 05/15/2023 12:09 PM.    Pt states his vision "improved a whole lot" after the first injection and he did not have any problems afterwards  Referring physician: Franky Ivanoff, PA-C  [facility] [address] [place],  [place] [address]  HISTORICAL INFORMATION:  Selected notes from the MEDICAL RECORD NUMBER Referred by Franky Ivanoff, PA-C for BRVO OS - originally referred in Jan 2025 -- delayed presentation to 03.19.25 LEE:  Ocular Hx- PMH-   CURRENT MEDICATIONS: No current outpatient medications on file. (Ophthalmic Drugs)   No current facility-administered medications for this visit. (Ophthalmic Drugs)   Current Outpatient Medications (Other)  Medication Sig   finasteride (PROSCAR) 5 MG tablet Take 5 mg by mouth daily.   tamsulosin (FLOMAX) 0.4 MG CAPS capsule Take 0.4 mg by mouth.   vitamin B-12 (CYANOCOBALAMIN) 500 MCG tablet Take 500 mcg by mouth daily.   No current facility-administered medications for this visit. (Other)   REVIEW OF SYSTEMS: ROS   Positive for: Neurological, Musculoskeletal, Cardiovascular, Eyes Negative for:  Constitutional, Gastrointestinal, Skin, Genitourinary, HENT, Endocrine, Respiratory, Psychiatric, Allergic/Imm, Heme/Lymph Last edited by Carrington Clack, COT on 05/15/2023  9:05 AM.     ALLERGIES Allergies  Allergen Reactions   Pravastatin Swelling and Other (See Comments)    Reported jaw and lip swelling along with myalgias in the back with pravastatin.  Symptoms resolved with stopping.    PAST MEDICAL HISTORY Past Medical History:  Diagnosis Date   Gastric ulcer    secondary to nsaid 10 years ago   GI bleed 04/04/2011   Hip pain, chronic    right   HLD (hyperlipidemia)    Hypertension    Obesity (BMI 35.0-39.9 without comorbidity) 03/2011   Stroke [facility])    2005   Past Surgical History:  Procedure Laterality Date   BIOPSY  12/17/2019   Procedure: BIOPSY;  Surgeon: Baldo Bonds, MD;  Location: [facility] ENDOSCOPY;  Service: Endoscopy;;   ESOPHAGOGASTRODUODENOSCOPY [name] 12/17/2019   Procedure: ESOPHAGOGASTRODUODENOSCOPY (EGD);  Surgeon: Baldo Bonds, MD;  Location: [facility] ENDOSCOPY;  Service: Endoscopy;  Laterality: [name];   TONSILLECTOMY     FAMILY HISTORY Family History  Problem Relation Age of Onset   Stroke Mother    Kidney disease Brother    Diabetes Brother    SOCIAL HISTORY Social History   Tobacco Use   Smoking status: Former   Smokeless tobacco: Never  Substance Use Topics   Alcohol use: Yes    Comment: occasional   Drug use: No  OPHTHALMIC EXAM:  Base Eye Exam     Visual Acuity (Snellen - Linear)       Right Left   Dist [place] 20/30 +2 >20/800   Dist ph [place] 20/20 -2 20/300 +1         Tonometry (Tonopen, 9:21 AM)       Right Left   Pressure 14 17         Pupils       Dark Light Shape React APD   Right 3 2 Round Brisk None   Left 3 2 Round Brisk None         Visual Fields       Left Right    Full Full         Extraocular Movement       Right Left    Full, Ortho Full, Ortho         Neuro/Psych     Oriented x3: Yes    Mood/Affect: Normal         Dilation     Both eyes: 1.0% Mydriacyl @ 9:22 AM           Slit Lamp and Fundus Exam     Slit Lamp Exam       Right Left   Lids/Lashes Dermatochalasis - upper lid, Meibomian gland dysfunction Dermatochalasis - upper lid, Meibomian gland dysfunction   Conjunctiva/Sclera White and quiet 1+ Injection   Cornea trace PEE trace PEE, arcus, mild tear film debris   Anterior Chamber deep, clear, narrow temporal angle deep, clear, narrow temporal angle   Iris Round and dilated Round and dilated   Lens 3+ Nuclear sclerosis with +brunescence, 3+ Cortical cataract 3+ Nuclear sclerosis with +brunescence, 3+ Cortical cataract   Anterior Vitreous mild syneresis mild syneresis         Fundus Exam       Right Left   Disc mild Pallor, Sharp rim, +cupping Central pallor, +cupping, Sharp rim, vascular loops, no heme   C/D Ratio 0.8 0.7   Macula Flat, Blunted foveal reflex, RPE mottling and clumping, No heme or edema Blunted foveal reflex, scattered DBH, +edema greatest temporal mac -- all improved, RPE mottling and clumping   Vessels attenuated, mild tortuosity attenuated, Tortuous, superior RVO, Telangiectasia   Periphery Attached, No heme Attached, scattered DBH superior hemisphere           IMAGING AND PROCEDURES  Imaging and Procedures for 05/15/2023  OCT, Retina - OU - Both Eyes       Right Eye Quality was good. Central Foveal Thickness: 249. Progression has been stable. Findings include normal foveal contour, no IRF, no SRF, retinal drusen (Partial PVD).   Left Eye Quality was good. Central Foveal Thickness: 173. Progression has improved. Findings include no SRF, abnormal foveal contour, intraretinal hyper-reflective material, intraretinal fluid, outer retinal atrophy (Central ORA and diffuse atrophy with interval improvement in focal edema).   Notes *Images captured and stored on drive  Diagnosis / Impression:  OD: NFP, no IRF/SRF OS: BRVO --  Central ORA and diffuse atrophy with interval improvement in focal edema  Clinical management:  See below  Abbreviations: NFP - Normal foveal profile. CME - cystoid macular edema. PED - pigment epithelial detachment. IRF - intraretinal fluid. SRF - subretinal fluid. EZ - ellipsoid zone. ERM - epiretinal membrane. ORA - outer retinal atrophy. ORT - outer retinal tubulation. SRHM - subretinal hyper-reflective material. IRHM - intraretinal hyper-reflective material      Intravitreal Injection, Pharmacologic Agent -  OS - Left Eye       Time Out 05/15/2023. 10:04 AM. Confirmed correct patient, procedure, site, and patient consented.   Anesthesia Topical anesthesia was used. Anesthetic medications included Lidocaine 2%, Proparacaine 0.5%.   Procedure Preparation included 5% betadine to ocular surface, eyelid speculum. A supplied needle was used.   Injection: 1.25 mg Bevacizumab 1.25mg /0.85ml   Route: Intravitreal, Site: Left Eye   NDC: [id number], Lot: [id number], Expiration date: 06/15/2023   Post-op Post injection exam found visual acuity of at least counting fingers. The patient tolerated the procedure well. There were no complications. The patient received written and verbal post procedure care education.           ASSESSMENT/PLAN:   ICD-10-CM   1. Branch retinal vein occlusion of left eye with macular edema  H34.8320 OCT, Retina - OU - Both Eyes    Intravitreal Injection, Pharmacologic Agent - OS - Left Eye    Bevacizumab (AVASTIN) SOLN 1.25 mg    2. Essential hypertension  I10     3. Hypertensive retinopathy of both eyes  H35.033     4. Combined forms of age-related cataract of both eyes  H25.813      BRVO w/ CME OS  - s/p IVA OS #1 (03.19.25) - BCVA OS improved to 20/300 from CF at 3' (was 20/400 in Jan 2025 at [facility]) - OCT shows Central ORA and diffuse atrophy with interval improvement in focal edema -- likely BRAO component causing atrophy - recommend IVA  OS #2 today, 04.16.25 for BRVO w/ CME - pt wishes to proceed with injection - RBA of procedure discussed, questions answered - IVA informed consent obtained and signed, 03.19.25 (OS) - see procedure note - F/U 4 weeks -- DFE/OCT/possible injection  2,3. Hypertensive retinopathy OU - discussed importance of tight BP control - monitor  4. Mixed Cataract OU - The symptoms of cataract, surgical options, and treatments and risks were discussed with patient. - discussed diagnosis and progression   Ophthalmic Meds Ordered this visit:  Meds ordered this encounter  Medications   Bevacizumab (AVASTIN) SOLN 1.25 mg     Return in about 4 weeks (around 06/12/2023) for f/u BRVO OS, DFE, OCT, Possible Injxn.  There are no Patient Instructions on file for this visit.  Explained the diagnoses, plan, and follow up with the patient and they expressed understanding.  Patient expressed understanding of the importance of proper follow up care.   This document serves as a record of services personally performed by Karie Chimera, MD, PhD. It was created on their behalf by Glee Arvin. Manson Passey, OA an ophthalmic technician. The creation of this record is the provider's dictation and/or activities during the visit.    Electronically signed by: Glee Arvin. Manson Passey, OA 05/15/23 12:10 PM   Karie Chimera, M.D., Ph.D. Diseases & Surgery of the Retina and Vitreous [facility] 05/15/2023  I have reviewed the above documentation for accuracy and completeness, and I agree with the above. Karie Chimera, M.D., Ph.D. 05/15/23 12:12 PM   Abbreviations: M myopia (nearsighted); A astigmatism; H hyperopia (farsighted); P presbyopia; Mrx spectacle prescription;  CTL contact lenses; OD right eye; OS left eye; OU both eyes  XT exotropia; ET esotropia; PEK punctate epithelial keratitis; PEE punctate epithelial erosions; DES dry eye syndrome; MGD meibomian gland dysfunction; ATs artificial tears; PFAT's  preservative free artificial tears; NSC nuclear sclerotic cataract; PSC posterior subcapsular cataract; ERM epi-retinal membrane; PVD posterior vitreous detachment;  RD retinal detachment; DM diabetes mellitus; DR diabetic retinopathy; NPDR non-proliferative diabetic retinopathy; PDR proliferative diabetic retinopathy; CSME clinically significant macular edema; DME diabetic macular edema; dbh dot blot hemorrhages; CWS cotton wool spot; POAG primary open angle glaucoma; C/D cup-to-disc ratio; HVF humphrey visual field; GVF goldmann visual field; OCT optical coherence tomography; IOP intraocular pressure; BRVO Branch retinal vein occlusion; CRVO central retinal vein occlusion; CRAO central retinal artery occlusion; BRAO branch retinal artery occlusion; RT retinal tear; SB scleral buckle; PPV pars plana vitrectomy; VH Vitreous hemorrhage; PRP panretinal laser photocoagulation; IVK intravitreal kenalog; VMT vitreomacular traction; MH Macular hole;  NVD neovascularization of the disc; NVE neovascularization elsewhere; AREDS age related eye disease study; ARMD age related macular degeneration; POAG primary open angle glaucoma; EBMD epithelial/anterior basement membrane dystrophy; ACIOL anterior chamber intraocular lens; IOL intraocular lens; PCIOL posterior chamber intraocular lens; Phaco/IOL phacoemulsification with intraocular lens placement; PRK photorefractive keratectomy; LASIK laser assisted in situ keratomileusis; HTN hypertension; DM diabetes mellitus; COPD chronic obstructive pulmonary disease

## 2023-05-15 ENCOUNTER — Ambulatory Visit (INDEPENDENT_AMBULATORY_CARE_PROVIDER_SITE_OTHER): Admitting: Ophthalmology

## 2023-05-15 ENCOUNTER — Encounter (INDEPENDENT_AMBULATORY_CARE_PROVIDER_SITE_OTHER): Payer: Self-pay | Admitting: Ophthalmology

## 2023-05-15 DIAGNOSIS — H34832 Tributary (branch) retinal vein occlusion, left eye, with macular edema: Secondary | ICD-10-CM

## 2023-05-15 DIAGNOSIS — I10 Essential (primary) hypertension: Secondary | ICD-10-CM

## 2023-05-15 DIAGNOSIS — H25813 Combined forms of age-related cataract, bilateral: Secondary | ICD-10-CM | POA: Diagnosis not present

## 2023-05-15 DIAGNOSIS — H35033 Hypertensive retinopathy, bilateral: Secondary | ICD-10-CM

## 2023-05-15 MED ORDER — BEVACIZUMAB CHEMO INJECTION 1.25MG/0.05ML SYRINGE FOR KALEIDOSCOPE
1.2500 mg | INTRAVITREAL | Status: AC | PRN
  Administered 2023-05-15: 1.25 mg via INTRAVITREAL

## 2023-05-29 NOTE — Progress Notes (Signed)
 [facility] - Clinic Note  06/12/2023   CHIEF COMPLAINT Patient presents for Retina Follow Up  HISTORY OF PRESENT ILLNESS: Steven Vargas is a 88 y.o. male who presents to the clinic today for:  HPI     Retina Follow Up   Patient presents with  CRVO/BRVO.  In left eye.  This started 4 weeks ago.  Severity is moderate.  Duration of 4 weeks.  Since onset it is stable.  I, the attending physician,  performed the HPI with the patient and updated documentation appropriately.        Comments   4 week retina follow up BRVO OS and IVA OS pt is reporting no vision changes noticed pt denies any flashes or floaters he has a cat eval with [facility] next week       Last edited by Ronelle Coffee, MD on 06/12/2023 12:36 PM.    Pt feels like his vision is improving, but he still feels like something is blocking his left eye vision  Referring physician: Franky Ivanoff, PA-C  [facility] [address] [place],  [place] [address]  HISTORICAL INFORMATION:  Selected notes from the MEDICAL RECORD NUMBER Referred by Franky Ivanoff, PA-C for BRVO OS - originally referred in Jan 2025 -- delayed presentation to 03.19.25 LEE:  Ocular Hx- PMH-   CURRENT MEDICATIONS: No current outpatient medications on file. (Ophthalmic Drugs)   No current facility-administered medications for this visit. (Ophthalmic Drugs)   Current Outpatient Medications (Other)  Medication Sig   finasteride  (PROSCAR ) 5 MG tablet Take 5 mg by mouth daily.   tamsulosin  (FLOMAX ) 0.4 MG CAPS capsule Take 0.4 mg by mouth.   vitamin B-12 (CYANOCOBALAMIN) 500 MCG tablet Take 500 mcg by mouth daily.   No current facility-administered medications for this visit. (Other)   REVIEW OF SYSTEMS: ROS   Positive for: Neurological, Musculoskeletal, Cardiovascular, Eyes Negative for: Constitutional, Gastrointestinal, Skin, Genitourinary, HENT, Endocrine, Respiratory, Psychiatric, Allergic/Imm,  Heme/Lymph Last edited by Alise Appl, COT on 06/12/2023  8:56 AM.      ALLERGIES Allergies  Allergen Reactions   Pravastatin  Swelling and Other (See Comments)    Reported jaw and lip swelling along with myalgias in the back with pravastatin .  Symptoms resolved with stopping.    PAST MEDICAL HISTORY Past Medical History:  Diagnosis Date   Gastric ulcer    secondary to nsaid 10 years ago   GI bleed 04/04/2011   Hip pain, chronic    right   HLD (hyperlipidemia)    Hypertension    Obesity (BMI 35.0-39.9 without comorbidity) 03/2011   Stroke [facility])    2005   Past Surgical History:  Procedure Laterality Date   BIOPSY  12/17/2019   Procedure: BIOPSY;  Surgeon: Baldo Bonds, MD;  Location: [facility] ENDOSCOPY;  Service: Endoscopy;;   ESOPHAGOGASTRODUODENOSCOPY [name] 12/17/2019   Procedure: ESOPHAGOGASTRODUODENOSCOPY (EGD);  Surgeon: Baldo Bonds, MD;  Location: [facility] ENDOSCOPY;  Service: Endoscopy;  Laterality: [name];   TONSILLECTOMY     FAMILY HISTORY Family History  Problem Relation Age of Onset   Stroke Mother    Kidney disease Brother    Diabetes Brother    SOCIAL HISTORY Social History   Tobacco Use   Smoking status: Former   Smokeless tobacco: Never  Substance Use Topics   Alcohol use: Yes    Comment: occasional   Drug use: No       OPHTHALMIC EXAM:  Base Eye Exam  Visual Acuity (Snellen - Linear)       Right Left   Dist [place] 20/40 -3 HM   Dist ph [place] NI NI         Tonometry (Tonopen, 9:00 AM)       Right Left   Pressure 14 12         Pupils       Pupils Dark Light Shape React APD   Right PERRL 3 2 Round Brisk None   Left PERRL 3 2 Round Brisk None         Visual Fields       Left Right    Full Full         Extraocular Movement       Right Left    Full, Ortho Full, Ortho         Neuro/Psych     Oriented x3: Yes   Mood/Affect: Normal         Dilation     Both eyes: 2.5% Phenylephrine  @ 9:00 AM            Slit Lamp and Fundus Exam     Slit Lamp Exam       Right Left   Lids/Lashes Dermatochalasis - upper lid, Meibomian gland dysfunction Dermatochalasis - upper lid, Meibomian gland dysfunction   Conjunctiva/Sclera White and quiet 1+ Injection   Cornea trace PEE trace PEE, arcus, mild tear film debris   Anterior Chamber deep, clear, narrow temporal angle deep, clear, narrow temporal angle   Iris Round and dilated Round and dilated   Lens 3+ Nuclear sclerosis with +brunescence, 3+ Cortical cataract 3+ Nuclear sclerosis with +brunescence, 3+ Cortical cataract   Anterior Vitreous mild syneresis mild syneresis         Fundus Exam       Right Left   Disc mild Pallor, Sharp rim, +cupping Central pallor, +cupping, Sharp rim, vascular loops, no heme   C/D Ratio 0.8 0.7   Macula Flat, Blunted foveal reflex, RPE mottling and clumping, No heme or edema Blunted foveal reflex, scattered DBH, +edema -- all improved, RPE mottling and clumping   Vessels attenuated, Tortuous, AV crossing changes attenuated, Tortuous, superior RVO, Telangiectasia   Periphery Attached, No heme Attached, scattered DBH superior hemisphere           IMAGING AND PROCEDURES  Imaging and Procedures for 06/12/2023  OCT, Retina - OU - Both Eyes        Right Eye Quality was good. Central Foveal Thickness: 253. Progression has been stable. Findings include normal foveal contour, no IRF, no SRF, retinal drusen (Partial PVD).   Left Eye Quality was good. Central Foveal Thickness: 167. Progression has improved. Findings include no SRF, abnormal foveal contour, intraretinal hyper-reflective material, intraretinal fluid, outer retinal atrophy (Central ORA and diffuse atrophy with interval improvement in focal edema).   Notes  *Images captured and stored on drive  Diagnosis / Impression:  OD: NFP, no IRF/SRF OS: BRVO -- Central ORA and diffuse atrophy with interval improvement in focal edema  Clinical management:   See below  Abbreviations: NFP - Normal foveal profile. CME - cystoid macular edema. PED - pigment epithelial detachment. IRF - intraretinal fluid. SRF - subretinal fluid. EZ - ellipsoid zone. ERM - epiretinal membrane. ORA - outer retinal atrophy. ORT - outer retinal tubulation. SRHM - subretinal hyper-reflective material. IRHM - intraretinal hyper-reflective material      Intravitreal Injection, Pharmacologic Agent - OS - Left Eye  Time Out 06/12/2023. 9:42 AM. Confirmed correct patient, procedure, site, and patient consented.   Anesthesia Topical anesthesia was used. Anesthetic medications included Lidocaine  2%, Proparacaine 0.5%.   Procedure Preparation included 5% betadine to ocular surface, eyelid speculum. A supplied needle was used.   Injection: 1.25 mg Bevacizumab  1.25mg /0.55ml   Route: Intravitreal, Site: Left Eye   NDC: [id number], Lot: [id number], Expiration date: 10/03/2023   Post-op Post injection exam found visual acuity of at least counting fingers. The patient tolerated the procedure well. There were no complications. The patient received written and verbal post procedure care education.            ASSESSMENT/PLAN:   ICD-10-CM   1. Branch retinal vein occlusion of left eye with macular edema  H34.8320 OCT, Retina - OU - Both Eyes    Intravitreal Injection, Pharmacologic Agent - OS - Left Eye    Bevacizumab  (AVASTIN ) SOLN 1.25 mg    2. Essential hypertension  I10     3. Hypertensive retinopathy of both eyes  H35.033     4. Combined forms of age-related cataract of both eyes  H25.813      BRVO w/ CME OS  - s/p IVA OS #1 (03.19.25), #2 (04.16.25) - BCVA OS HM from 20/300 (was 20/400 in Jan 2025 at [facility]) - OCT shows Central ORA and diffuse atrophy with interval improvement in focal edema -- likely BRAO component causing atrophy at 4 weeks - recommend IVA OS #3 today, 05.14.25 for BRVO w/ CME with follow up extended to 5-6 weeks - pt wishes to  proceed with injection - RBA of procedure discussed, questions answered - IVA informed consent obtained and signed, 03.19.25 (OS) - see procedure note - F/U 5-6 weeks -- DFE/OCT/possible injection  2,3. Hypertensive retinopathy OU - discussed importance of tight BP control - monitor  4. Mixed Cataract OU - The symptoms of cataract, surgical options, and treatments and risks were discussed with patient. - discussed diagnosis and progression - has an appt at [facility] on Monday, May 19th  Ophthalmic Meds Ordered this visit:  Meds ordered this encounter  Medications   Bevacizumab  (AVASTIN ) SOLN 1.25 mg     Return for f/u 5-6 weeks, BRVO OS, DFE, OCT.  There are no Patient Instructions on file for this visit.  Explained the diagnoses, plan, and follow up with the patient and they expressed understanding.  Patient expressed understanding of the importance of proper follow up care.   This document serves as a record of services personally performed by Jeanice Millard, MD, PhD. It was created on their behalf by Angelia Kelp, an ophthalmic technician. The creation of this record is the provider's dictation and/or activities during the visit.    Electronically signed by: Angelia Kelp, OA, 06/17/23  8:47 PM  This document serves as a record of services personally performed by Jeanice Millard, MD, PhD. It was created on their behalf by Morley Arabia. Bevin Bucks, OA an ophthalmic technician. The creation of this record is the provider's dictation and/or activities during the visit.    Electronically signed by: Morley Arabia. Bevin Bucks, OA 06/17/23 8:47 PM  Jeanice Millard, M.D., Ph.D. Diseases & Surgery of the Retina and Vitreous [facility] 06/12/2023  I have reviewed the above documentation for accuracy and completeness, and I agree with the above. Jeanice Millard, M.D., Ph.D. 06/17/23 8:47 PM   Abbreviations: M myopia (nearsighted); A astigmatism; H hyperopia  (farsighted); P presbyopia; Mrx  spectacle prescription;  CTL contact lenses; OD right eye; OS left eye; OU both eyes  XT exotropia; ET esotropia; PEK punctate epithelial keratitis; PEE punctate epithelial erosions; DES dry eye syndrome; MGD meibomian gland dysfunction; ATs artificial tears; PFAT's preservative free artificial tears; NSC nuclear sclerotic cataract; PSC posterior subcapsular cataract; ERM epi-retinal membrane; PVD posterior vitreous detachment; RD retinal detachment; DM diabetes mellitus; DR diabetic retinopathy; NPDR non-proliferative diabetic retinopathy; PDR proliferative diabetic retinopathy; CSME clinically significant macular edema; DME diabetic macular edema; dbh dot blot hemorrhages; CWS cotton wool spot; POAG primary open angle glaucoma; C/D cup-to-disc ratio; HVF humphrey visual field; GVF goldmann visual field; OCT optical coherence tomography; IOP intraocular pressure; BRVO Branch retinal vein occlusion; CRVO central retinal vein occlusion; CRAO central retinal artery occlusion; BRAO branch retinal artery occlusion; RT retinal tear; SB scleral buckle; PPV pars plana vitrectomy; VH Vitreous hemorrhage; PRP panretinal laser photocoagulation; IVK intravitreal kenalog; VMT vitreomacular traction; MH Macular hole;  NVD neovascularization of the disc; NVE neovascularization elsewhere; AREDS age related eye disease study; ARMD age related macular degeneration; POAG primary open angle glaucoma; EBMD epithelial/anterior basement membrane dystrophy; ACIOL anterior chamber intraocular lens; IOL intraocular lens; PCIOL posterior chamber intraocular lens; Phaco/IOL phacoemulsification with intraocular lens placement; PRK photorefractive keratectomy; LASIK laser assisted in situ keratomileusis; HTN hypertension; DM diabetes mellitus; COPD chronic obstructive pulmonary disease

## 2023-06-12 ENCOUNTER — Encounter (INDEPENDENT_AMBULATORY_CARE_PROVIDER_SITE_OTHER): Payer: Self-pay | Admitting: Ophthalmology

## 2023-06-12 ENCOUNTER — Ambulatory Visit (INDEPENDENT_AMBULATORY_CARE_PROVIDER_SITE_OTHER): Admitting: Ophthalmology

## 2023-06-12 DIAGNOSIS — I10 Essential (primary) hypertension: Secondary | ICD-10-CM | POA: Diagnosis not present

## 2023-06-12 DIAGNOSIS — H25813 Combined forms of age-related cataract, bilateral: Secondary | ICD-10-CM | POA: Diagnosis not present

## 2023-06-12 DIAGNOSIS — H35033 Hypertensive retinopathy, bilateral: Secondary | ICD-10-CM

## 2023-06-12 DIAGNOSIS — H34832 Tributary (branch) retinal vein occlusion, left eye, with macular edema: Secondary | ICD-10-CM | POA: Diagnosis not present

## 2023-06-12 MED ORDER — BEVACIZUMAB CHEMO INJECTION 1.25MG/0.05ML SYRINGE FOR KALEIDOSCOPE
1.2500 mg | INTRAVITREAL | Status: AC | PRN
  Administered 2023-06-12: 1.25 mg via INTRAVITREAL

## 2023-06-17 DIAGNOSIS — H02402 Unspecified ptosis of left eyelid: Secondary | ICD-10-CM | POA: Diagnosis not present

## 2023-06-17 DIAGNOSIS — H4321 Crystalline deposits in vitreous body, right eye: Secondary | ICD-10-CM | POA: Diagnosis not present

## 2023-06-17 DIAGNOSIS — H25813 Combined forms of age-related cataract, bilateral: Secondary | ICD-10-CM | POA: Diagnosis not present

## 2023-06-17 DIAGNOSIS — H04123 Dry eye syndrome of bilateral lacrimal glands: Secondary | ICD-10-CM | POA: Diagnosis not present

## 2023-06-17 DIAGNOSIS — H35033 Hypertensive retinopathy, bilateral: Secondary | ICD-10-CM | POA: Diagnosis not present

## 2023-06-17 DIAGNOSIS — H34832 Tributary (branch) retinal vein occlusion, left eye, with macular edema: Secondary | ICD-10-CM | POA: Diagnosis not present

## 2023-07-11 DIAGNOSIS — H25811 Combined forms of age-related cataract, right eye: Secondary | ICD-10-CM | POA: Diagnosis not present

## 2023-07-11 DIAGNOSIS — H268 Other specified cataract: Secondary | ICD-10-CM | POA: Diagnosis not present

## 2023-07-15 NOTE — Progress Notes (Signed)
 [facility] - Clinic Note  07/24/2023   CHIEF COMPLAINT Patient presents for Retina Follow Up  HISTORY OF PRESENT ILLNESS: Steven Vargas is a 88 y.o. male who presents to the clinic today for:  HPI     Retina Follow Up   Patient presents with  CRVO/BRVO.  In left eye.  Severity is moderate.  Duration of 6 weeks.  Since onset it is stable.  I, the attending physician,  performed the HPI with the patient and updated documentation appropriately.        Comments   6 week Retina eval. Patient states he saw Dr.C Groat recently. No vision changes noticed      Last edited by [name], MD on 07/24/2023 10:55 PM.    Pt feels like his vision is improving, he has an appt with Dr. Octavia on July 16th  Referring physician: Clem Brands, PA-C  [facility] [address] [name],  [place] [address]  HISTORICAL INFORMATION:  Selected notes from the MEDICAL RECORD NUMBER Referred by Clem Brands, PA-C for BRVO OS - originally referred in Jan 2025 -- delayed presentation to 03.19.25 LEE:  Ocular Hx- PMH-   CURRENT MEDICATIONS: No current outpatient medications on file. (Ophthalmic Drugs)   No current facility-administered medications for this visit. (Ophthalmic Drugs)   Current Outpatient Medications (Other)  Medication Sig   finasteride  (PROSCAR ) 5 MG tablet Take 5 mg by mouth daily.   tamsulosin  (FLOMAX ) 0.4 MG CAPS capsule Take 0.4 mg by mouth.   vitamin B-12 (CYANOCOBALAMIN) 500 MCG tablet Take 500 mcg by mouth daily.   No current facility-administered medications for this visit. (Other)   REVIEW OF SYSTEMS: ROS   Positive for: Neurological, Musculoskeletal, Cardiovascular, Eyes Negative for: Constitutional, Gastrointestinal, Skin, Genitourinary, HENT, Endocrine, Respiratory, Psychiatric, Allergic/Imm, Heme/Lymph Last edited by German Olam BRAVO, COT on 07/24/2023  8:49 AM.       ALLERGIES Allergies  Allergen Reactions    Pravastatin  Swelling and Other (See Comments)    Reported jaw and lip swelling along with myalgias in the back with pravastatin .  Symptoms resolved with stopping.    PAST MEDICAL HISTORY Past Medical History:  Diagnosis Date   Gastric ulcer    secondary to nsaid 10 years ago   GI bleed 04/04/2011   Hip pain, chronic    right   HLD (hyperlipidemia)    Hypertension    Obesity (BMI 35.0-39.9 without comorbidity) 03/2011   Stroke [facility])    2005   Past Surgical History:  Procedure Laterality Date   BIOPSY  12/17/2019   Procedure: BIOPSY;  Surgeon: Dianna Specking, MD;  Location: [facility] ENDOSCOPY;  Service: Endoscopy;;   ESOPHAGOGASTRODUODENOSCOPY [name] 12/17/2019   Procedure: ESOPHAGOGASTRODUODENOSCOPY (EGD);  Surgeon: Dianna Specking, MD;  Location: [facility] ENDOSCOPY;  Service: Endoscopy;  Laterality: [name];   TONSILLECTOMY     FAMILY HISTORY Family History  Problem Relation Age of Onset   Stroke Mother    Kidney disease Brother    Diabetes Brother    SOCIAL HISTORY Social History   Tobacco Use   Smoking status: Former   Smokeless tobacco: Never  Substance Use Topics   Alcohol use: Yes    Comment: occasional   Drug use: No       OPHTHALMIC EXAM:  Base Eye Exam     Visual Acuity (Snellen - Linear)       Right Left   Dist [place] 20/20 20/CF   Dist ph [place]  20/NI         Tonometry (Tonopen, 8:51 AM)       Right Left   Pressure 9 11         Pupils       Dark Light Shape React APD   Right 3 2 Round Brisk None   Left 3 2 Round Brisk None         Visual Fields (Counting fingers)       Left Right    Full Full         Extraocular Movement       Right Left    Full, Ortho Full, Ortho         Neuro/Psych     Oriented x3: Yes   Mood/Affect: Normal         Dilation     Both eyes: 1.0% Mydriacyl, 2.5% Phenylephrine  @ 8:51 AM           Slit Lamp and Fundus Exam     Slit Lamp Exam       Right Left   Lids/Lashes Dermatochalasis - upper lid,  Meibomian gland dysfunction Dermatochalasis - upper lid, Meibomian gland dysfunction   Conjunctiva/Sclera White and quiet 1+ Injection   Cornea trace PEE, well healed cataract wound trace PEE, arcus, mild tear film debris   Anterior Chamber deep, clear, narrow temporal angle deep, clear, narrow temporal angle   Iris Round and dilated Round and dilated   Lens PC IOL in good position 3+ Nuclear sclerosis with brunescence, 3+ Cortical cataract   Anterior Vitreous mild syneresis mild syneresis         Fundus Exam       Right Left   Disc mild Pallor, Sharp rim, +cupping Central pallor, +cupping, Sharp rim, vascular loops, no heme   C/D Ratio 0.8 0.7   Macula Flat, Blunted foveal reflex, RPE mottling and clumping, No heme or edema Blunted foveal reflex, scattered DBH, +edema -- all improved, RPE mottling and clumping   Vessels attenuated, Tortuous, AV crossing changes attenuated, Tortuous, superior RVO, Telangiectasia   Periphery Attached, blot heme inferior midzone Attached, scattered DBH superior hemisphere -- slightly improved           IMAGING AND PROCEDURES  Imaging and Procedures for 07/24/2023  OCT, Retina - OU - Both Eyes       Right Eye Quality was good. Central Foveal Thickness: 253. Progression has been stable. Findings include normal foveal contour, no IRF, no SRF, retinal drusen (Partial PVD).   Left Eye Quality was good. Central Foveal Thickness: 164. Progression has improved. Findings include no SRF, abnormal foveal contour, intraretinal hyper-reflective material, intraretinal fluid, outer retinal atrophy (Central ORA and diffuse atrophy with interval improvement in focal cystic changes / edema temporal macula).   Notes *Images captured and stored on drive  Diagnosis / Impression:  OD: NFP, no IRF/SRF OS: BRVO -- Central ORA and diffuse atrophy with interval improvement in focal cystic changes / edema temporal macula  Clinical management:  See  below  Abbreviations: NFP - Normal foveal profile. CME - cystoid macular edema. PED - pigment epithelial detachment. IRF - intraretinal fluid. SRF - subretinal fluid. EZ - ellipsoid zone. ERM - epiretinal membrane. ORA - outer retinal atrophy. ORT - outer retinal tubulation. SRHM - subretinal hyper-reflective material. IRHM - intraretinal hyper-reflective material      Intravitreal Injection, Pharmacologic Agent - OS - Left Eye       Time Out 07/24/2023. 9:17 AM. Confirmed correct patient,  procedure, site, and patient consented.   Anesthesia Topical anesthesia was used. Anesthetic medications included Lidocaine  2%, Proparacaine 0.5%.   Procedure Preparation included 5% betadine to ocular surface, eyelid speculum. A supplied needle was used.   Injection: 1.25 mg Bevacizumab  1.25mg /0.42ml   Route: Intravitreal, Site: Left Eye   NDC: [id number], Lot: [id number], Expiration date: 10/28/2023   Post-op Post injection exam found visual acuity of at least counting fingers. The patient tolerated the procedure well. There were no complications. The patient received written and verbal post procedure care education.           ASSESSMENT/PLAN:   ICD-10-CM   1. Branch retinal vein occlusion of left eye with macular edema  H34.8320 OCT, Retina - OU - Both Eyes    Intravitreal Injection, Pharmacologic Agent - OS - Left Eye    Bevacizumab  (AVASTIN ) SOLN 1.25 mg    2. Essential hypertension  I10     3. Hypertensive retinopathy of both eyes  H35.033     4. Combined forms of age-related cataract of left eye  H25.812     5. Pseudophakia  Z96.1       BRVO w/ CME OS  - s/p IVA OS #1 (03.19.25), #2 (04.16.25), #3 (03.19.25) - BCVA OS CF from HM (was 20/400 in Jan 2025 at [facility]) - OCT shows Central ORA and diffuse atrophy with interval improvement in focal cystic changes / edema temporal macula at 6 weeks - recommend IVA OS #4 today, 06.25.25 for BRVO w/ CME with follow up extended to  8 weeks - pt wishes to proceed with injection - RBA of procedure discussed, questions answered - IVA informed consent obtained and signed, 03.19.25 (OS) - see procedure note - F/U 8 weeks -- DFE/OCT/possible injection  2,3. Hypertensive retinopathy OU - discussed importance of tight BP control - monitor  4. Mixed Cataract OS - The symptoms of cataract, surgical options, and treatments and risks were discussed with patient. - discussed diagnosis and progression - has an appt at [facility] on July 16th  5. Pseudophakia OD  - s/p CE/IOL OD (Dr. Octavia)  - IOL in good position, doing well  - post op drops per cataract surgeon  - monitor   Ophthalmic Meds Ordered this visit:  Meds ordered this encounter  Medications   Bevacizumab  (AVASTIN ) SOLN 1.25 mg     Return in about 8 weeks (around 09/18/2023) for f/u BRVO OS, DFE, OCT, Possible Injxn.  There are no Patient Instructions on file for this visit.  Explained the diagnoses, plan, and follow up with the patient and they expressed understanding.  Patient expressed understanding of the importance of proper follow up care.   This document serves as a record of services personally performed by Redell JUDITHANN Hans, MD, PhD. It was created on their behalf by Almetta Pesa, an ophthalmic technician. The creation of this record is the provider's dictation and/or activities during the visit.    Electronically signed by: Almetta Pesa, OA, 07/24/23  11:01 PM  This document serves as a record of services personally performed by Redell JUDITHANN Hans, MD, PhD. It was created on their behalf by Alan PARAS. Delores, OA an ophthalmic technician. The creation of this record is the provider's dictation and/or activities during the visit.    Electronically signed by: Alan PARAS. Delores, OA 07/24/23 11:01 PM  Redell JUDITHANN Hans, M.D., Ph.D. Diseases & Surgery of the Retina and Vitreous [facility] 07/24/2023  I have  reviewed the  above documentation for accuracy and completeness, and I agree with the above. Redell JUDITHANN Hans, M.D., Ph.D. 07/24/23 11:01 PM   Abbreviations: M myopia (nearsighted); A astigmatism; H hyperopia (farsighted); P presbyopia; Mrx spectacle prescription;  CTL contact lenses; OD right eye; OS left eye; OU both eyes  XT exotropia; ET esotropia; PEK punctate epithelial keratitis; PEE punctate epithelial erosions; DES dry eye syndrome; MGD meibomian gland dysfunction; ATs artificial tears; PFAT's preservative free artificial tears; NSC nuclear sclerotic cataract; PSC posterior subcapsular cataract; ERM epi-retinal membrane; PVD posterior vitreous detachment; RD retinal detachment; DM diabetes mellitus; DR diabetic retinopathy; NPDR non-proliferative diabetic retinopathy; PDR proliferative diabetic retinopathy; CSME clinically significant macular edema; DME diabetic macular edema; dbh dot blot hemorrhages; CWS cotton wool spot; POAG primary open angle glaucoma; C/D cup-to-disc ratio; HVF humphrey visual field; GVF goldmann visual field; OCT optical coherence tomography; IOP intraocular pressure; BRVO Branch retinal vein occlusion; CRVO central retinal vein occlusion; CRAO central retinal artery occlusion; BRAO branch retinal artery occlusion; RT retinal tear; SB scleral buckle; PPV pars plana vitrectomy; VH Vitreous hemorrhage; PRP panretinal laser photocoagulation; IVK intravitreal kenalog; VMT vitreomacular traction; MH Macular hole;  NVD neovascularization of the disc; NVE neovascularization elsewhere; AREDS age related eye disease study; ARMD age related macular degeneration; POAG primary open angle glaucoma; EBMD epithelial/anterior basement membrane dystrophy; ACIOL anterior chamber intraocular lens; IOL intraocular lens; PCIOL posterior chamber intraocular lens; Phaco/IOL phacoemulsification with intraocular lens placement; PRK photorefractive keratectomy; LASIK laser assisted in situ keratomileusis; HTN  hypertension; DM diabetes mellitus; COPD chronic obstructive pulmonary disease

## 2023-07-24 ENCOUNTER — Encounter (INDEPENDENT_AMBULATORY_CARE_PROVIDER_SITE_OTHER): Payer: Self-pay | Admitting: Ophthalmology

## 2023-07-24 ENCOUNTER — Ambulatory Visit (INDEPENDENT_AMBULATORY_CARE_PROVIDER_SITE_OTHER): Admitting: Ophthalmology

## 2023-07-24 DIAGNOSIS — H25813 Combined forms of age-related cataract, bilateral: Secondary | ICD-10-CM

## 2023-07-24 DIAGNOSIS — H34832 Tributary (branch) retinal vein occlusion, left eye, with macular edema: Secondary | ICD-10-CM

## 2023-07-24 DIAGNOSIS — Z961 Presence of intraocular lens: Secondary | ICD-10-CM

## 2023-07-24 DIAGNOSIS — I10 Essential (primary) hypertension: Secondary | ICD-10-CM | POA: Diagnosis not present

## 2023-07-24 DIAGNOSIS — H25812 Combined forms of age-related cataract, left eye: Secondary | ICD-10-CM | POA: Diagnosis not present

## 2023-07-24 DIAGNOSIS — H35033 Hypertensive retinopathy, bilateral: Secondary | ICD-10-CM

## 2023-07-24 MED ORDER — BEVACIZUMAB CHEMO INJECTION 1.25MG/0.05ML SYRINGE FOR KALEIDOSCOPE
1.2500 mg | INTRAVITREAL | Status: AC | PRN
  Administered 2023-07-24: 1.25 mg via INTRAVITREAL

## 2023-09-12 NOTE — Progress Notes (Signed)
 [facility] - Clinic Note  09/18/2023   CHIEF COMPLAINT Patient presents for Retina Follow Up  HISTORY OF PRESENT ILLNESS: Steven Vargas is a 88 y.o. male who presents to the clinic today for:  HPI     Retina Follow Up   Patient presents with  CRVO/BRVO.  In left eye.  Severity is moderate.  Duration of 8 weeks.  Since onset it is stable.  I, the attending physician,  performed the HPI with the patient and updated documentation appropriately.        Comments   8 week Retina eval. Patient states vision seems the same      Last edited by [name], MD on 09/18/2023 11:54 PM.     Pt states VA does feel like its improving OS. No new health issues.   Referring physician: Clem Brands, PA-C  [facility] [address] [place],  [place] [address]  HISTORICAL INFORMATION:  Selected notes from the MEDICAL RECORD NUMBER Referred by Clem Brands, PA-C for BRVO OS - originally referred in Jan 2025 -- delayed presentation to 03.19.25 LEE:  Ocular Hx- PMH-   CURRENT MEDICATIONS: No current outpatient medications on file. (Ophthalmic Drugs)   No current facility-administered medications for this visit. (Ophthalmic Drugs)   Current Outpatient Medications (Other)  Medication Sig   finasteride  (PROSCAR ) 5 MG tablet Take 5 mg by mouth daily.   tamsulosin  (FLOMAX ) 0.4 MG CAPS capsule Take 0.4 mg by mouth.   vitamin B-12 (CYANOCOBALAMIN) 500 MCG tablet Take 500 mcg by mouth daily.   No current facility-administered medications for this visit. (Other)   REVIEW OF SYSTEMS: ROS   Positive for: Neurological, Musculoskeletal, Cardiovascular, Eyes Negative for: Constitutional, Gastrointestinal, Skin, Genitourinary, HENT, Endocrine, Respiratory, Psychiatric, Allergic/Imm, Heme/Lymph Last edited by German Olam BRAVO, COT on 09/18/2023  8:54 AM.     ALLERGIES Allergies  Allergen Reactions   Pravastatin  Swelling and Other (See Comments)     Reported jaw and lip swelling along with myalgias in the back with pravastatin .  Symptoms resolved with stopping.    PAST MEDICAL HISTORY Past Medical History:  Diagnosis Date   Gastric ulcer    secondary to nsaid 10 years ago   GI bleed 04/04/2011   Hip pain, chronic    right   HLD (hyperlipidemia)    Hypertension    Obesity (BMI 35.0-39.9 without comorbidity) 03/2011   Stroke [facility])    2005   Past Surgical History:  Procedure Laterality Date   BIOPSY  12/17/2019   Procedure: BIOPSY;  Surgeon: Dianna Specking, MD;  Location[facility] ENDOSCOPY;  Service: Endoscopy;;   ESOPHAGOGASTRODUODENOSCOPY [name] 12/17/2019   Procedure: ESOPHAGOGASTRODUODENOSCOPY (EGD);  Surgeon: Dianna Specking, MD;  Location: [facility] ENDOSCOPY;  Service: Endoscopy;  Laterality: [name];   TONSILLECTOMY     FAMILY HISTORY Family History  Problem Relation Age of Onset   Stroke Mother    Kidney disease Brother    Diabetes Brother    SOCIAL HISTORY Social History   Tobacco Use   Smoking status: Former   Smokeless tobacco: Never  Substance Use Topics   Alcohol use: Yes    Comment: occasional   Drug use: No       OPHTHALMIC EXAM:  Base Eye Exam     Visual Acuity (Snellen - Linear)       Right Left   Dist [place] 20/20 -1 20/400   Dist ph [place] 20/NI 20/NI  Tonometry (Tonopen, 8:56 AM)       Right Left   Pressure 15 19         Pupils       Dark Light Shape React APD   Right 3 2 Round Brisk None   Left 3 2 Round Minimal None         Visual Fields (Counting fingers)       Left Right    Full Full         Extraocular Movement       Right Left    Full, Ortho Full, Ortho         Neuro/Psych     Oriented x3: Yes   Mood/Affect: Normal         Dilation     Both eyes: 1.0% Mydriacyl, 2.5% Phenylephrine  @ 8:56 AM           Slit Lamp and Fundus Exam     Slit Lamp Exam       Right Left   Lids/Lashes Dermatochalasis - upper lid, Meibomian gland dysfunction  Dermatochalasis - upper lid, Meibomian gland dysfunction   Conjunctiva/Sclera White and quiet 1+ Injection   Cornea trace PEE, well healed cataract wound trace PEE, arcus, mild tear film debris   Anterior Chamber deep, clear, narrow temporal angle deep, clear, narrow temporal angle   Iris Round and dilated Round and dilated   Lens PC IOL in good position 3+ Nuclear sclerosis with brunescence, 3+ Cortical cataract   Anterior Vitreous mild syneresis mild syneresis         Fundus Exam       Right Left   Disc mild Pallor, Sharp rim, +cupping Central pallor, +cupping, Sharp rim, vascular loops, no heme   C/D Ratio 0.8 0.7   Macula Flat, Good foveal reflex, RPE mottling and clumping, No heme or edema Blunted foveal reflex, scattered DBH/+edema -- all improved, RPE mottling and clumping   Vessels attenuated, Tortuous, AV crossing changes attenuated, Tortuous, superior RVO, Telangiectasia   Periphery Attached, blot heme inferior midzone Attached, scattered DBH superior hemisphere -- slightly improved           IMAGING AND PROCEDURES  Imaging and Procedures for 09/18/2023  OCT, Retina - OU - Both Eyes       Right Eye Quality was good. Central Foveal Thickness: 253. Progression has been stable. Findings include normal foveal contour, no IRF, no SRF, retinal drusen (Interval release of partial PVD).   Left Eye Quality was good. Central Foveal Thickness: 167. Progression has been stable. Findings include no SRF, abnormal foveal contour, intraretinal hyper-reflective material, intraretinal fluid, outer retinal atrophy (Central ORA and mild progression of diffuse atrophy with stable improvement in focal cystic changes / edema temporal macula).   Notes *Images captured and stored on drive  Diagnosis / Impression:  OD: NFP, no IRF/SRF OS: BRVO -- Central ORA and mild progression of diffuse atrophy; stable improvement in focal cystic changes / edema temporal macula  Clinical management:   See below  Abbreviations: NFP - Normal foveal profile. CME - cystoid macular edema. PED - pigment epithelial detachment. IRF - intraretinal fluid. SRF - subretinal fluid. EZ - ellipsoid zone. ERM - epiretinal membrane. ORA - outer retinal atrophy. ORT - outer retinal tubulation. SRHM - subretinal hyper-reflective material. IRHM - intraretinal hyper-reflective material      Intravitreal Injection, Pharmacologic Agent - OS - Left Eye       Time Out 09/18/2023. 10:07 AM. Confirmed correct patient, procedure,  site, and patient consented.   Anesthesia Topical anesthesia was used. Anesthetic medications included Lidocaine  2%, Proparacaine 0.5%.   Procedure Preparation included 5% betadine to ocular surface, eyelid speculum. A (32g) needle was used.   Injection: 1.25 mg Bevacizumab  1.25mg /0.93ml   Route: Intravitreal, Site: Left Eye   NDC: [id number], Lot: [id number], Expiration date: 10/31/2023   Post-op Post injection exam found visual acuity of at least counting fingers. The patient tolerated the procedure well. There were no complications. The patient received written and verbal post procedure care education.           ASSESSMENT/PLAN:   ICD-10-CM   1. Branch retinal vein occlusion of left eye with macular edema  H34.8320 OCT, Retina - OU - Both Eyes    Intravitreal Injection, Pharmacologic Agent - OS - Left Eye    Bevacizumab  (AVASTIN ) SOLN 1.25 mg    2. Essential hypertension  I10     3. Hypertensive retinopathy of both eyes  H35.033     4. Combined forms of age-related cataract of left eye  H25.812     5. Pseudophakia  Z96.1      BRVO w/ CME OS  - s/p IVA OS #1 (03.19.25), #2 (04.16.25), #3 (03.19.25), #4 (06.25.25)  - BCVA OS CF from HM (was 20/400 in Jan 2025 at [facility]) - OCT shows Central ORA and mild progression of diffuse atrophy; stable improvement in focal cystic changes / edema temporal macula at 8 weeks - recommend IVA today OS #5 (08.20.25) for BRVO  w/ CME with follow up extended to 10 weeks - pt wishes to proceed with injection - RBA of procedure discussed, questions answered - IVA informed consent obtained and signed, 03.19.25 (OS) - see procedure note - F/U 10 weeks -- DFE/OCT/possible injection(s)  2,3. Hypertensive retinopathy OU - discussed importance of tight BP control - monitor  4. Mixed Cataract OS - The symptoms of cataract, surgical options, and treatments and risks were discussed with patient. - discussed diagnosis and progression - has an appt at [facility] next week  5. Pseudophakia OD  - s/p CE/IOL OD (Dr. Octavia)  - IOL in good position, doing well  - post op drops per cataract surgeon  - monitor  Ophthalmic Meds Ordered this visit:  Meds ordered this encounter  Medications   Bevacizumab  (AVASTIN ) SOLN 1.25 mg     Return in about 10 weeks (around 11/27/2023) for BRVO OS, DFE, OCT, Possible Injxn.  There are no Patient Instructions on file for this visit.  Explained the diagnoses, plan, and follow up with the patient and they expressed understanding.  Patient expressed understanding of the importance of proper follow up care.   This document serves as a record of services personally performed by Redell JUDITHANN Hans, MD, PhD. It was created on their behalf by Avelina Pereyra, COA an ophthalmic technician. The creation of this record is the provider's dictation and/or activities during the visit.   Electronically signed by: Avelina GORMAN Pereyra, COT  09/24/23  9:42 PM   This document serves as a record of services personally performed by Redell JUDITHANN Hans, MD, PhD. It was created on their behalf by Almetta Pesa, an ophthalmic technician. The creation of this record is the provider's dictation and/or activities during the visit.    Electronically signed by: Almetta Pesa, OA, 09/24/23  9:42 PM  Redell JUDITHANN Hans, M.D., Ph.D. Diseases & Surgery of the Retina and Vitreous [facility] 09/18/2023  I have reviewed the above documentation for accuracy and completeness, and I agree with the above. Redell JUDITHANN Hans, M.D., Ph.D. 09/24/23 9:43 PM   Abbreviations: M myopia (nearsighted); A astigmatism; H hyperopia (farsighted); P presbyopia; Mrx spectacle prescription;  CTL contact lenses; OD right eye; OS left eye; OU both eyes  XT exotropia; ET esotropia; PEK punctate epithelial keratitis; PEE punctate epithelial erosions; DES dry eye syndrome; MGD meibomian gland dysfunction; ATs artificial tears; PFAT's preservative free artificial tears; NSC nuclear sclerotic cataract; PSC posterior subcapsular cataract; ERM epi-retinal membrane; PVD posterior vitreous detachment; RD retinal detachment; DM diabetes mellitus; DR diabetic retinopathy; NPDR non-proliferative diabetic retinopathy; PDR proliferative diabetic retinopathy; CSME clinically significant macular edema; DME diabetic macular edema; dbh dot blot hemorrhages; CWS cotton wool spot; POAG primary open angle glaucoma; C/D cup-to-disc ratio; HVF humphrey visual field; GVF goldmann visual field; OCT optical coherence tomography; IOP intraocular pressure; BRVO Branch retinal vein occlusion; CRVO central retinal vein occlusion; CRAO central retinal artery occlusion; BRAO branch retinal artery occlusion; RT retinal tear; SB scleral buckle; PPV pars plana vitrectomy; VH Vitreous hemorrhage; PRP panretinal laser photocoagulation; IVK intravitreal kenalog; VMT vitreomacular traction; MH Macular hole;  NVD neovascularization of the disc; NVE neovascularization elsewhere; AREDS age related eye disease study; ARMD age related macular degeneration; POAG primary open angle glaucoma; EBMD epithelial/anterior basement membrane dystrophy; ACIOL anterior chamber intraocular lens; IOL intraocular lens; PCIOL posterior chamber intraocular lens; Phaco/IOL phacoemulsification with intraocular lens placement; PRK photorefractive keratectomy; LASIK laser assisted  in situ keratomileusis; HTN hypertension; DM diabetes mellitus; COPD chronic obstructive pulmonary disease

## 2023-09-18 ENCOUNTER — Encounter (INDEPENDENT_AMBULATORY_CARE_PROVIDER_SITE_OTHER): Payer: Self-pay | Admitting: Ophthalmology

## 2023-09-18 ENCOUNTER — Ambulatory Visit (INDEPENDENT_AMBULATORY_CARE_PROVIDER_SITE_OTHER): Admitting: Ophthalmology

## 2023-09-18 DIAGNOSIS — H25812 Combined forms of age-related cataract, left eye: Secondary | ICD-10-CM

## 2023-09-18 DIAGNOSIS — H35033 Hypertensive retinopathy, bilateral: Secondary | ICD-10-CM

## 2023-09-18 DIAGNOSIS — Z961 Presence of intraocular lens: Secondary | ICD-10-CM

## 2023-09-18 DIAGNOSIS — H34832 Tributary (branch) retinal vein occlusion, left eye, with macular edema: Secondary | ICD-10-CM

## 2023-09-18 DIAGNOSIS — I10 Essential (primary) hypertension: Secondary | ICD-10-CM | POA: Diagnosis not present

## 2023-09-18 MED ORDER — BEVACIZUMAB CHEMO INJECTION 1.25MG/0.05ML SYRINGE FOR KALEIDOSCOPE
1.2500 mg | INTRAVITREAL | Status: AC | PRN
  Administered 2023-09-18: 1.25 mg via INTRAVITREAL

## 2023-11-19 NOTE — Progress Notes (Shared)
 [facility] - Clinic Note  11/27/2023   CHIEF COMPLAINT Patient presents for No chief complaint on file.  HISTORY OF PRESENT ILLNESS: Steven Vargas is a 88 y.o. male who presents to the clinic today for:    Pt states VA does feel like its improving OS. No new health issues.   Referring physician: Clem Brands, PA-C  [facility] [address] [place],  [place] [address]  HISTORICAL INFORMATION:  Selected notes from the MEDICAL RECORD NUMBER Referred by Clem Brands, PA-C for BRVO OS - originally referred in Jan 2025 -- delayed presentation to 03.19.25 LEE:  Ocular Hx- PMH-   CURRENT MEDICATIONS: No current outpatient medications on file. (Ophthalmic Drugs)   No current facility-administered medications for this visit. (Ophthalmic Drugs)   Current Outpatient Medications (Other)  Medication Sig   finasteride  (PROSCAR ) 5 MG tablet Take 5 mg by mouth daily.   tamsulosin  (FLOMAX ) 0.4 MG CAPS capsule Take 0.4 mg by mouth.   vitamin B-12 (CYANOCOBALAMIN) 500 MCG tablet Take 500 mcg by mouth daily.   No current facility-administered medications for this visit. (Other)   REVIEW OF SYSTEMS:   ALLERGIES Allergies  Allergen Reactions   Pravastatin  Swelling and Other (See Comments)    Reported jaw and lip swelling along with myalgias in the back with pravastatin .  Symptoms resolved with stopping.    PAST MEDICAL HISTORY Past Medical History:  Diagnosis Date   Gastric ulcer    secondary to nsaid 10 years ago   GI bleed 04/04/2011   Hip pain, chronic    right   HLD (hyperlipidemia)    Hypertension    Obesity (BMI 35.0-39.9 without comorbidity) 03/2011   Stroke [facility])    2005   Past Surgical History:  Procedure Laterality Date   BIOPSY  12/17/2019   Procedure: BIOPSY;  Surgeon: Dianna Specking, MD;  Location: [facility] ENDOSCOPY;  Service: Endoscopy;;   ESOPHAGOGASTRODUODENOSCOPY [name] 12/17/2019   Procedure:  ESOPHAGOGASTRODUODENOSCOPY (EGD);  Surgeon: Dianna Specking, MD;  Location[facility] ENDOSCOPY;  Service: Endoscopy;  Laterality: [name];   TONSILLECTOMY     FAMILY HISTORY Family History  Problem Relation Age of Onset   Stroke Mother    Kidney disease Brother    Diabetes Brother    SOCIAL HISTORY Social History   Tobacco Use   Smoking status: Former   Smokeless tobacco: Never  Substance Use Topics   Alcohol use: Yes    Comment: occasional   Drug use: No       OPHTHALMIC EXAM:  Not recorded    IMAGING AND PROCEDURES  Imaging and Procedures for 11/27/2023         ASSESSMENT/PLAN: No diagnosis found.  BRVO w/ CME OS  - s/p IVA OS #1 (03.19.25), #2 (04.16.25), #3 (03.19.25), #4 (06.25.25), #5 (08.20.25) - BCVA OS CF from [facility] (was 20/400 in Jan 2025 at [facility]) - OCT shows Central ORA and mild progression of diffuse atrophy; stable improvement in focal cystic changes / edema temporal macula at 8 weeks - recommend IVA today OS #6 (10.29.25) for BRVO w/ CME with follow up extended to 10 weeks - pt wishes to proceed with injection - RBA of procedure discussed, questions answered - IVA informed consent obtained and signed, 03.19.25 (OS) - see procedure note - F/U 10 weeks -- DFE/OCT/possible injection(s)  2,3. Hypertensive retinopathy OU - discussed importance of tight BP control - monitor  4. Mixed Cataract OS - The symptoms of cataract,  surgical options, and treatments and risks were discussed with patient. - discussed diagnosis and progression - has an appt at [facility] next week  5. Pseudophakia OD  - s/p CE/IOL OD (Dr. Octavia)  - IOL in good position, doing well  - post op drops per cataract surgeon  - monitor  Ophthalmic Meds Ordered this visit:  No orders of the defined types were placed in this encounter.    No follow-ups on file.  There are no Patient Instructions on file for this visit.  Explained the diagnoses, plan, and follow up with the  patient and they expressed understanding.  Patient expressed understanding of the importance of proper follow up care.    This document serves as a record of services personally performed by Redell JUDITHANN Hans, MD, PhD. It was created on their behalf by Almetta Pesa, an ophthalmic technician. The creation of this record is the provider's dictation and/or activities during the visit.    Electronically signed by: Almetta Pesa, OA, 11/19/23  11:59 AM  Redell JUDITHANN Hans, M.D., Ph.D. Diseases & Surgery of the Retina and Vitreous [facility] 11/27/2023    Abbreviations: M myopia (nearsighted); A astigmatism; H hyperopia (farsighted); P presbyopia; Mrx spectacle prescription;  CTL contact lenses; OD right eye; OS left eye; OU both eyes  XT exotropia; ET esotropia; PEK punctate epithelial keratitis; PEE punctate epithelial erosions; DES dry eye syndrome; MGD meibomian gland dysfunction; ATs artificial tears; PFAT's preservative free artificial tears; NSC nuclear sclerotic cataract; PSC posterior subcapsular cataract; ERM epi-retinal membrane; PVD posterior vitreous detachment; RD retinal detachment; DM diabetes mellitus; DR diabetic retinopathy; NPDR non-proliferative diabetic retinopathy; PDR proliferative diabetic retinopathy; CSME clinically significant macular edema; DME diabetic macular edema; dbh dot blot hemorrhages; CWS cotton wool spot; POAG primary open angle glaucoma; C/D cup-to-disc ratio; HVF humphrey visual field; GVF goldmann visual field; OCT optical coherence tomography; IOP intraocular pressure; BRVO Branch retinal vein occlusion; CRVO central retinal vein occlusion; CRAO central retinal artery occlusion; BRAO branch retinal artery occlusion; RT retinal tear; SB scleral buckle; PPV pars plana vitrectomy; VH Vitreous hemorrhage; PRP panretinal laser photocoagulation; IVK intravitreal kenalog; VMT vitreomacular traction; MH Macular hole;  NVD neovascularization of the  disc; NVE neovascularization elsewhere; AREDS age related eye disease study; ARMD age related macular degeneration; POAG primary open angle glaucoma; EBMD epithelial/anterior basement membrane dystrophy; ACIOL anterior chamber intraocular lens; IOL intraocular lens; PCIOL posterior chamber intraocular lens; Phaco/IOL phacoemulsification with intraocular lens placement; PRK photorefractive keratectomy; LASIK laser assisted in situ keratomileusis; HTN hypertension; DM diabetes mellitus; COPD chronic obstructive pulmonary disease

## 2023-11-20 ENCOUNTER — Ambulatory Visit: Payer: Medicare Other

## 2023-11-20 VITALS — Ht 67.0 in | Wt 210.0 lb

## 2023-11-20 DIAGNOSIS — Z0000 Encounter for general adult medical examination without abnormal findings: Secondary | ICD-10-CM | POA: Diagnosis not present

## 2023-11-20 NOTE — Progress Notes (Signed)
 Subjective:   Steven Vargas is a 88 y.o. who presents for a Medicare Wellness preventive visit.  As a reminder, Annual Wellness Visits don't include a physical exam, and some assessments may be limited, especially if this visit is performed virtually. We may recommend an in-person follow-up visit with your provider if needed.  Visit Complete: Virtual I connected with  Takashi Korol on 11/20/23 by a audio enabled telemedicine application and verified that I am speaking with the correct person using two identifiers.  Patient Location: Home  Provider Location: Office/Clinic  I discussed the limitations of evaluation and management by telemedicine. The patient expressed understanding and agreed to proceed.  Vital Signs: Because this visit was a virtual/telehealth visit, some criteria may be missing or patient reported. Any vitals not documented were not able to be obtained and vitals that have been documented are patient reported.  VideoDeclined- This patient declined Librarian, academic. Therefore the visit was completed with audio only.  Persons Participating in Visit: Patient.  AWV Questionnaire: No: Patient Medicare AWV questionnaire was not completed prior to this visit.  Cardiac Risk Factors include: advanced age (>25men, >48 women);hypertension;male gender;obesity (BMI >30kg/m2)     Objective:    Today's Vitals   11/20/23 1123  Weight: 210 lb (95.3 kg)  Height: 5' 7 (1.702 m)   Body mass index is 32.89 kg/m.     11/20/2023   11:22 AM 11/19/2022   11:19 AM 11/16/2021   10:50 AM 11/14/2020    9:41 AM 12/17/2019    9:10 AM 12/16/2019    4:54 PM 12/15/2019    7:47 PM  Advanced Directives  Does Patient Have a Medical Advance Directive? No No No No No  No  Would patient like information on creating a medical advance directive? No - Patient declined No - Patient declined No - Patient declined No - Patient declined No - Patient declined No - Patient  declined     Current Medications (verified) Outpatient Encounter Medications as of 11/20/2023  Medication Sig   finasteride  (PROSCAR ) 5 MG tablet Take 5 mg by mouth daily.   tamsulosin  (FLOMAX ) 0.4 MG CAPS capsule Take 0.4 mg by mouth.   vitamin B-12 (CYANOCOBALAMIN) 500 MCG tablet Take 500 mcg by mouth daily.   No facility-administered encounter medications on file as of 11/20/2023.    Allergies (verified) Pravastatin    History: Past Medical History:  Diagnosis Date   Gastric ulcer    secondary to nsaid 10 years ago   GI bleed 04/04/2011   Hip pain, chronic    right   HLD (hyperlipidemia)    Hypertension    Obesity (BMI 35.0-39.9 without comorbidity) 03/2011   Stroke [facility])    2005   Past Surgical History:  Procedure Laterality Date   BIOPSY  12/17/2019   Procedure: BIOPSY;  Surgeon: Dianna Specking, MD;  Location[facility] ENDOSCOPY;  Service: Endoscopy;;   ESOPHAGOGASTRODUODENOSCOPY [name] 12/17/2019   Procedure: ESOPHAGOGASTRODUODENOSCOPY (EGD);  Surgeon: Dianna Specking, MD;  Location: [facility] ENDOSCOPY;  Service: Endoscopy;  Laterality: [name];   TONSILLECTOMY     Family History  Problem Relation Age of Onset   Stroke Mother    Kidney disease Brother    Diabetes Brother    Social History   Socioeconomic History   Marital status: Married    Spouse name: Not on file   Number of children: Not on file   Years of education: Not on file   Highest education level: Not on file  Occupational  History   Not on file  Tobacco Use   Smoking status: Former   Smokeless tobacco: Never  Substance and Sexual Activity   Alcohol use: Yes    Comment: occasional   Drug use: No   Sexual activity: Not Currently  Other Topics Concern   Not on file  Social History Narrative   Lives in [place], [place]  with his wife and son. 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 Drinks 2-3 beers once every few weeks. Smoking 2 years ago. Used to smoke 2-3 packs per day before that  for past many years. Never did illicit drugs. Has United health care Medicare.   Social Drivers of Corporate investment banker Strain: Low Risk  (11/20/2023)   Overall Financial Resource Strain (CARDIA)    Difficulty of Paying Living Expenses: Not hard at all  Food Insecurity: No Food Insecurity (11/20/2023)   Hunger Vital Sign    Worried About Running Out of Food in the Last Year: Never true    Ran Out of Food in the Last Year: Never true  Transportation Needs: No Transportation Needs (11/20/2023)   PRAPARE - Administrator, Civil Service (Medical): No    Lack of Transportation (Non-Medical): No  Physical Activity: Sufficiently Active (11/20/2023)   Exercise Vital Sign    Days of Exercise per Week: 5 days    Minutes of Exercise per Session: 30 min  Stress: No Stress Concern Present (11/20/2023)   [employer] of Occupational Health - Occupational Stress Questionnaire    Feeling of Stress: Only a little  Social Connections: Socially Integrated (11/20/2023)   Social Connection and Isolation Panel    Frequency of Communication with Friends and Family: More than three times a week    Frequency of Social Gatherings with Friends and Family: More than three times a week    Attends Religious Services: More than 4 times per year    Active Member of [employer] or Organizations: Yes    Attends Engineer, structural: More than 4 times per year    Marital Status: Married    Tobacco Counseling Counseling given: Not Answered    Clinical Intake:  Pre-visit preparation completed: Yes  Pain : No/denies pain     BMI - recorded: 31.79 Nutritional Status: BMI > 30  Obese Nutritional Risks: None Diabetes: No  Lab Results  Component Value Date   HGBA1C 6.4 09/05/2020   HGBA1C 5.9 (H) 10/21/2019   HGBA1C 6.2 (H) 04/20/2019     How often do you need to have someone help you when you read instructions, pamphlets, or other written materials from your doctor or  pharmacy?: 1 - Never  Interpreter Needed?: No  Information entered by :: Verdie Saba, CMA   Activities of Daily Living     11/20/2023   11:27 AM  In your present state of health, do you have any difficulty performing the following activities:  Hearing? 0  Vision? 0  Difficulty concentrating or making decisions? 0  Walking or climbing stairs? 0  Dressing or bathing? 0  Doing errands, shopping? 0  Preparing Food and eating ? N  Using the Toilet? N  In the past six months, have you accidently leaked urine? Y  Do you have problems with loss of bowel control? N  Managing your Medications? N  Managing your Finances? N  Housekeeping or managing your Housekeeping? N    Patient Care Team: Purcell Emil Schanz, MD as PCP - General (  Internal Medicine) Nieves Cough, MD as Consulting Physician (Urology) Luciano Standing, MD as Consulting Physician (Otolaryngology) Christine Rush, DPM as Consulting Physician (Podiatry) Octavia Bruckner, MD as Consulting Physician (Ophthalmology)  I have updated your Care Teams any recent Medical Services you may have received from other providers in the past year.     Assessment:   This is a routine wellness examination for Jolly.  Hearing/Vision screen Hearing Screening - Comments:: Denies hearing difficulties   Vision Screening - Comments:: Currently under tx with Dr Medford Octavia   Goals Addressed               This Visit's Progress     Patient Stated (pt-stated)        Patient stated he is seeing his Ophthalmologist and managing his eye care       Depression Screen     11/20/2023   11:28 AM 11/19/2022   11:28 AM 11/16/2021   10:53 AM 03/08/2021   10:01 AM 11/14/2020    9:40 AM 09/05/2020    1:36 PM 10/21/2019   10:25 AM  PHQ 2/9 Scores  PHQ - 2 Score 0 0 0 0 0 0 0  PHQ- 9 Score 0 0         Fall Risk     11/20/2023   11:27 AM 11/19/2022   11:19 AM 11/16/2021   10:51 AM 11/14/2020    9:44 AM 09/05/2020    1:06 PM  Fall  Risk   Falls in the past year? 0 0 0 0 0  Number falls in past yr: 0 0 0 0 0  Injury with Fall? 0 0 0 0 0  Risk for fall due to : No Fall Risks No Fall Risks No Fall Risks No Fall Risks   Follow up Falls evaluation completed;Falls prevention discussed Falls prevention discussed Falls prevention discussed  Falls evaluation completed       Data saved with a previous flowsheet row definition    MEDICARE RISK AT HOME:  Medicare Risk at Home Any stairs in or around the home?: No If so, are there any without handrails?: No Home free of loose throw rugs in walkways, pet beds, electrical cords, etc?: Yes Adequate lighting in your home to reduce risk of falls?: Yes Life alert?: No Use of a cane, walker or w/c?: No Grab bars in the bathroom?: No Shower chair or bench in shower?: No Elevated toilet seat or a handicapped toilet?: No  TIMED UP AND GO:  Was the test performed?  No  Cognitive Function: 6CIT completed    11/19/2022   11:23 AM  MMSE - Mini Mental State Exam  Not completed: Unable to complete        11/20/2023   11:29 AM 11/19/2022   11:23 AM 11/16/2021   10:57 AM  6CIT Screen  What Year? 0 points 0 points 0 points  What month? 0 points 0 points 0 points  What time? 0 points 0 points 0 points  Count back from 20 0 points 0 points 0 points  Months in reverse 2 points 0 points 0 points  Repeat phrase 0 points 0 points 0 points  Total Score 2 points 0 points 0 points    Immunizations Immunization History  Administered Date(s) Administered   Fluad Quad(high Dose 65+) 10/20/2018   INFLUENZA, HIGH DOSE SEASONAL PF 10/17/2017   Influenza,inj,Quad PF,6+ Mos 10/18/2016   PFIZER(Purple Top)SARS-COV-2 Vaccination 05/29/2019, 06/22/2019   Pneumococcal Conjugate-13 04/12/2016   Pneumococcal Polysaccharide-23 10/17/2017  Screening Tests Health Maintenance  Topic Date Due   DTaP/Tdap/Td (1 - Tdap) Never done   Zoster Vaccines- Shingrix (1 of 2) Never done    Influenza Vaccine  08/30/2023   COVID-19 Vaccine (3 - 2025-26 season) 09/30/2023   Medicare Annual Wellness (AWV)  11/19/2024   Pneumococcal Vaccine: 50+ Years  Completed   Meningococcal B Vaccine  Aged Out    Health Maintenance Items Addressed:  I have recommended that this patient have a immunization for Influenza, Tdap, and Shingles but he declines at this time. I have discussed the risks and benefits of this procedure with him. The patient verbalizes understanding.   Additional Screening:  Vision Screening: Recommended annual ophthalmology exams for early detection of glaucoma and other disorders of the eye. Is the patient up to date with their annual eye exam?  Yes  Who is the provider or what is the name of the office in which the patient attends annual eye exams? [facility]  Dental Screening: Recommended annual dental exams for proper oral hygiene  Community Resource Referral / Chronic Care Management: CRR required this visit?  No   CCM required this visit?  No   Plan:    I have personally reviewed and noted the following in the patient's chart:   Medical and social history Use of alcohol, tobacco or illicit drugs  Current medications and supplements including opioid prescriptions. Patient is not currently taking opioid prescriptions. Functional ability and status Nutritional status Physical activity Advanced directives List of other physicians Hospitalizations, surgeries, and ER visits in previous 12 months Vitals Screenings to include cognitive, depression, and falls Referrals and appointments  In addition, I have reviewed and discussed with patient certain preventive protocols, quality metrics, and best practice recommendations. A written personalized care plan for preventive services as well as general preventive health recommendations were provided to patient.   Verdie CHRISTELLA Saba, CMA   11/20/2023   After Visit Summary: (MyChart) Due to this being a  telephonic visit, the after visit summary with patients personalized plan was offered to patient via MyChart   Notes:  Scheduled a 1-yr Physical w/PCP for 12/2023.

## 2023-11-20 NOTE — Patient Instructions (Addendum)
 Steven Vargas,  Thank you for taking the time for your Medicare Wellness Visit. I appreciate your continued commitment to your health goals. Please review the care plan we discussed, and feel free to reach out if I can assist you further.  Medicare recommends these wellness visits once per year to help you and your care team stay ahead of potential health issues. These visits are designed to focus on prevention, allowing your provider to concentrate on managing your acute and chronic conditions during your regular appointments.  Please note that Annual Wellness Visits do not include a physical exam. Some assessments may be limited, especially if the visit was conducted virtually. If needed, we may recommend a separate in-person follow-up with your provider.  Ongoing Care Seeing your primary care provider every 3 to 6 months helps us  monitor your health and provide consistent, personalized care.   Referrals If a referral was made during today's visit and you haven't received any updates within two weeks, please contact the referred provider directly to check on the status.  Recommended Screenings:  Health Maintenance  Topic Date Due   DTaP/Tdap/Td vaccine (1 - Tdap) Never done   Zoster (Shingles) Vaccine (1 of 2) Never done   Flu Shot  08/30/2023   COVID-19 Vaccine (3 - 2025-26 season) 09/30/2023   Medicare Annual Wellness Visit  11/19/2024   Pneumococcal Vaccine for age over 2  Completed   Meningitis B Vaccine  Aged Out       11/20/2023   11:22 AM  Advanced Directives  Does Patient Have a Medical Advance Directive? No  Would patient like information on creating a medical advance directive? No - Patient declined   Advance Care Planning is important because it: Ensures you receive medical care that aligns with your values, goals, and preferences. Provides guidance to your family and loved ones, reducing the emotional burden of decision-making during critical moments.  Vision: Annual  vision screenings are recommended for early detection of glaucoma, cataracts, and diabetic retinopathy. These exams can also reveal signs of chronic conditions such as diabetes and high blood pressure.  Dental: Annual dental screenings help detect early signs of oral cancer, gum disease, and other conditions linked to overall health, including heart disease and diabetes.

## 2023-11-27 ENCOUNTER — Encounter (INDEPENDENT_AMBULATORY_CARE_PROVIDER_SITE_OTHER): Admitting: Ophthalmology

## 2023-11-27 DIAGNOSIS — H25812 Combined forms of age-related cataract, left eye: Secondary | ICD-10-CM

## 2023-11-27 DIAGNOSIS — H35033 Hypertensive retinopathy, bilateral: Secondary | ICD-10-CM

## 2023-11-27 DIAGNOSIS — H34832 Tributary (branch) retinal vein occlusion, left eye, with macular edema: Secondary | ICD-10-CM

## 2023-11-27 DIAGNOSIS — I10 Essential (primary) hypertension: Secondary | ICD-10-CM

## 2023-11-27 DIAGNOSIS — Z961 Presence of intraocular lens: Secondary | ICD-10-CM

## 2024-01-13 ENCOUNTER — Encounter: Admitting: Emergency Medicine

## 2024-11-20 ENCOUNTER — Ambulatory Visit
